# Patient Record
Sex: Female | Born: 1950 | Race: Black or African American | Hispanic: No | Smoking: Never smoker
Health system: Southern US, Community
[De-identification: ages and names within clinical notes are randomized; demographics above are authoritative.]

## PROBLEM LIST (undated history)

## (undated) DIAGNOSIS — K219 Gastro-esophageal reflux disease without esophagitis: Secondary | ICD-10-CM

## (undated) DIAGNOSIS — D649 Anemia, unspecified: Secondary | ICD-10-CM

## (undated) DIAGNOSIS — K449 Diaphragmatic hernia without obstruction or gangrene: Secondary | ICD-10-CM

## (undated) DIAGNOSIS — E7800 Pure hypercholesterolemia, unspecified: Secondary | ICD-10-CM

## (undated) DIAGNOSIS — M1990 Unspecified osteoarthritis, unspecified site: Secondary | ICD-10-CM

## (undated) DIAGNOSIS — F419 Anxiety disorder, unspecified: Secondary | ICD-10-CM

## (undated) DIAGNOSIS — I10 Essential (primary) hypertension: Secondary | ICD-10-CM

## (undated) HISTORY — PX: TUBAL LIGATION: SHX77

## (undated) HISTORY — PX: TOTAL HIP ARTHROPLASTY: SHX124

## (undated) HISTORY — PX: NASAL SINUS SURGERY: SHX719

## (undated) HISTORY — PX: BREAST SURGERY: SHX581

---

## 2002-05-04 ENCOUNTER — Encounter: Admission: RE | Admit: 2002-05-04 | Discharge: 2002-05-04 | Payer: Self-pay | Admitting: Internal Medicine

## 2002-05-04 ENCOUNTER — Encounter: Payer: Self-pay | Admitting: Internal Medicine

## 2003-02-22 ENCOUNTER — Inpatient Hospital Stay (HOSPITAL_COMMUNITY): Admission: RE | Admit: 2003-02-22 | Discharge: 2003-02-28 | Payer: Self-pay | Admitting: Orthopedic Surgery

## 2018-05-17 ENCOUNTER — Inpatient Hospital Stay: Admit: 2018-05-17 | Payer: Self-pay | Admitting: Orthopedic Surgery

## 2018-05-17 SURGERY — ARTHROPLASTY, KNEE, TOTAL
Anesthesia: Choice | Laterality: Left

## 2018-11-15 ENCOUNTER — Ambulatory Visit: Admit: 2018-11-15 | Payer: Self-pay | Admitting: Orthopedic Surgery

## 2018-11-15 SURGERY — ARTHROPLASTY, KNEE, TOTAL
Anesthesia: Choice | Laterality: Left

## 2019-04-01 ENCOUNTER — Encounter (HOSPITAL_BASED_OUTPATIENT_CLINIC_OR_DEPARTMENT_OTHER): Payer: Self-pay | Admitting: *Deleted

## 2019-04-01 ENCOUNTER — Other Ambulatory Visit: Payer: Self-pay

## 2019-04-01 ENCOUNTER — Emergency Department (HOSPITAL_BASED_OUTPATIENT_CLINIC_OR_DEPARTMENT_OTHER)
Admission: EM | Admit: 2019-04-01 | Discharge: 2019-04-01 | Disposition: A | Discharge status: Home / Self Care | Payer: Medicare Other | Attending: Emergency Medicine | Admitting: Emergency Medicine

## 2019-04-01 DIAGNOSIS — I10 Essential (primary) hypertension: Secondary | ICD-10-CM | POA: Insufficient documentation

## 2019-04-01 DIAGNOSIS — Z20828 Contact with and (suspected) exposure to other viral communicable diseases: Secondary | ICD-10-CM | POA: Insufficient documentation

## 2019-04-01 DIAGNOSIS — R112 Nausea with vomiting, unspecified: Secondary | ICD-10-CM | POA: Diagnosis not present

## 2019-04-01 HISTORY — DX: Essential (primary) hypertension: I10

## 2019-04-01 HISTORY — DX: Pure hypercholesterolemia, unspecified: E78.00

## 2019-04-01 MED ORDER — ONDANSETRON 4 MG PO TBDP: 4.0000 mg | ORAL_TABLET | Freq: Three times a day (TID) | ORAL | 0 refills | Status: DC | PRN

## 2019-04-01 MED ORDER — ONDANSETRON 4 MG PO TBDP
4.0000 mg | ORAL_TABLET | Freq: Once | ORAL | Status: AC
  Administered 2019-04-01: 4 mg via ORAL
  Filled 2019-04-01: qty 1

## 2019-04-01 NOTE — Discharge Instructions (Signed)
You were seen in the emergency department today with nausea and vomiting.  I'm prescribing a medication for nausea called Zofran.  Please take as needed.  I'm also testing you for COVID-19.  You can follow the results in the MyChart app.  There is information in this paperwork on how to set that up and follow your results.  Please remain in quarantine until those come back and you're feeling better.  Please return to the emergency department any new or suddenly worsening symptoms.

## 2019-04-01 NOTE — ED Notes (Signed)
Diet Ginger Ale provided for po challenge; pt instructed to wait 15 min after Zofran given to try fluids

## 2019-04-01 NOTE — ED Provider Notes (Signed)
Emergency Department Provider Note   I have reviewed the triage vital signs and the nursing notes.   HISTORY  Chief Complaint Emesis   HPI Vanessa Blair is a 68 y.o. female with PMH of HLD and HTN presents to the emergency department for evaluation of acute onset nausea and vomiting starting this afternoon.  Patient states that she had 3 episodes of vomiting without blood in the emesis.  No diarrhea.  She denies pain in the abdomen or chest.  No shortness of breath or heart palpitations.  Denies diaphoresis.  No fevers or shaking chills.  Patient states that since vomiting the third time she is feeling somewhat better but decided to present to the emergency department to get checked out anyway.  She has no sore throat, nasal congestion, body aches.  No known COVID-19 contacts.  She states she is interested in discussing the COVID-19 test, however.   Past Medical History:  Diagnosis Date  . Hypercholesteremia   . Hypertension     There are no problems to display for this patient.   Past Surgical History:  Procedure Laterality Date  . TOTAL HIP ARTHROPLASTY      Allergies Patient has no known allergies.  History reviewed. No pertinent family history.  Social History Social History   Tobacco Use  . Smoking status: Never Smoker  . Smokeless tobacco: Never Used  Substance Use Topics  . Alcohol use: Never  . Drug use: Never    Review of Systems  Constitutional: No fever/chills Eyes: No visual changes. ENT: No sore throat. Cardiovascular: Denies chest pain. Respiratory: Denies shortness of breath. Gastrointestinal: No abdominal pain. Positive nausea and vomiting.  No diarrhea.  No constipation. Genitourinary: Negative for dysuria. Musculoskeletal: Negative for back pain. Skin: Negative for rash. Neurological: Negative for headaches, focal weakness or numbness.  10-point ROS otherwise negative.  ____________________________________________   PHYSICAL  EXAM:  VITAL SIGNS: ED Triage Vitals  Enc Vitals Group     BP 04/01/19 1553 110/75     Pulse Rate 04/01/19 1553 94     Resp 04/01/19 1553 18     Temp 04/01/19 1553 97.6 F (36.4 C)     Temp Source 04/01/19 1553 Oral     SpO2 04/01/19 1553 90 %     Weight 04/01/19 1548 230 lb (104.3 kg)     Height 04/01/19 1548 5\' 9"  (1.753 m)   Constitutional: Alert and oriented. Well appearing and in no acute distress. Eyes: Conjunctivae are normal.  Head: Atraumatic. Nose: No congestion/rhinnorhea. Mouth/Throat: Mucous membranes are moist.   Neck: No stridor.   Cardiovascular: Normal rate, regular rhythm. Good peripheral circulation. Grossly normal heart sounds.   Respiratory: Normal respiratory effort.  No retractions. Lungs CTAB. Gastrointestinal: Soft and nontender. No distention.  Musculoskeletal: No gross deformities of extremities. Neurologic:  Normal speech and language.  Skin:  Skin is warm, dry and intact. No rash noted.  ____________________________________________   PROCEDURES  Procedure(s) performed:   Procedures  None  ____________________________________________   INITIAL IMPRESSION / ASSESSMENT AND PLAN / ED COURSE  Pertinent labs & imaging results that were available during my care of the patient were reviewed by me and considered in my medical decision making (see chart for details).   Patient presents to the emergency department with 3 separate episodes of vomiting.  No abdominal pain or tenderness on exam.  Patient denies chest pain, diaphoresis, or additional symptoms to make me suspect atypical anginal presentation.  Patient is feeling better after  vomiting the third time.  Plan for ODT Zofran and p.o. challenge here.  I suspect that GI symptoms in isolation or not likely related to COVID-19 but this could represent a possible early infection.  We will send PCR testing as a precaution and instruct patient to quarantine pending results.  She will check results in  MyChart.   Patient tolerating PO without difficulty. Will discharge with Zofran and plan as above.  ____________________________________________  FINAL CLINICAL IMPRESSION(S) / ED DIAGNOSES  Final diagnoses:  Non-intractable vomiting with nausea, unspecified vomiting type     MEDICATIONS GIVEN DURING THIS VISIT:  Medications  ondansetron (ZOFRAN-ODT) disintegrating tablet 4 mg (4 mg Oral Given 04/01/19 1619)     NEW OUTPATIENT MEDICATIONS STARTED DURING THIS VISIT:  Discharge Medication List as of 04/01/2019  5:13 PM    START taking these medications   Details  ondansetron (ZOFRAN ODT) 4 MG disintegrating tablet Take 1 tablet (4 mg total) by mouth every 8 (eight) hours as needed for nausea or vomiting., Starting Fri 04/01/2019, Print        Note:  This document was prepared using Dragon voice recognition software and may include unintentional dictation errors.  Alona Bene, MD, [facility] Emergency [facility]    Arhan Mcmanamon, Arlyss Repress, MD 04/02/19 [phone]

## 2019-04-01 NOTE — ED Triage Notes (Signed)
Vomiting that started a couple hours PTA. Denies pain. Reports taking laxative last night and having BMs this morning.

## 2019-04-03 LAB — NOVEL CORONAVIRUS, NAA (HOSP ORDER, SEND-OUT TO REF LAB; TAT 18-24 HRS): SARS-CoV-2, NAA: NOT DETECTED

## 2019-05-23 ENCOUNTER — Ambulatory Visit: Admit: 2019-05-23 | Payer: Medicare Other | Admitting: Orthopedic Surgery

## 2019-05-23 SURGERY — ARTHROPLASTY, KNEE, TOTAL
Anesthesia: Choice | Site: Knee | Laterality: Left

## 2019-06-29 NOTE — H&P (Signed)
TOTAL KNEE ADMISSION H&P  Patient is being admitted for left total knee arthroplasty.  Subjective:  Chief Complaint: Left knee pain.  HPI: Vanessa Blair, 69 y.o. female has a history of pain and functional disability in the left knee due to arthritis and has failed non-surgical conservative treatments for greater than 12 weeks to include corticosteriod injections, viscosupplementation injections and activity modification. Onset of symptoms was gradual, starting several years ago with gradually worsening course since that time. The patient noted no past surgery on the left knee.  Patient currently rates pain in the left knee at 9 out of 10 with activity. Patient has worsening of pain with activity and weight bearing, pain that interferes with activities of daily living, crepitus and joint swelling. Patient has evidence of severe collapse of the medial joint with bone on bone arthritis and tibial subluxation. large osteophyte formation along the medial femoral condyle by imaging studies. There is no active infection.  There are no problems to display for this patient.   Past Medical History:  Diagnosis Date  . Hypercholesteremia   . Hypertension     Past Surgical History:  Procedure Laterality Date  . TOTAL HIP ARTHROPLASTY      Prior to Admission medications   Medication Sig Start Date End Date Taking? Authorizing Provider  ondansetron (ZOFRAN ODT) 4 MG disintegrating tablet Take 1 tablet (4 mg total) by mouth every 8 (eight) hours as needed for nausea or vomiting. 04/01/19   Long, Arlyss Repress, MD    No Known Allergies  Social History   Socioeconomic History  . Marital status: Widowed    Spouse name: Not on file  . Number of children: Not on file  . Years of education: Not on file  . Highest education level: Not on file  Occupational History  . Not on file  Tobacco Use  . Smoking status: Never Smoker  . Smokeless tobacco: Never Used  Substance and Sexual Activity  . Alcohol  use: Never  . Drug use: Never  . Sexual activity: Not on file  Other Topics Concern  . Not on file  Social History Narrative  . Not on file   Social Determinants of Health   Financial Resource Strain:   . Difficulty of Paying Living Expenses:   Food Insecurity:   . Worried About Programme researcher, broadcasting/film/video in the Last Year:   . Barista in the Last Year:   Transportation Needs:   . Freight forwarder (Medical):   Marland Kitchen Lack of Transportation (Non-Medical):   Physical Activity:   . Days of Exercise per Week:   . Minutes of Exercise per Session:   Stress:   . Feeling of Stress :   Social Connections:   . Frequency of Communication with Friends and Family:   . Frequency of Social Gatherings with Friends and Family:   . Attends Religious Services:   . Active Member of Clubs or Organizations:   . Attends Banker Meetings:   Marland Kitchen Marital Status:   Intimate Partner Violence:   . Fear of Current or Ex-Partner:   . Emotionally Abused:   Marland Kitchen Physically Abused:   . Sexually Abused:       Tobacco Use: Low Risk   . Smoking Tobacco Use: Never Smoker  . Smokeless Tobacco Use: Never Used   Social History   Substance and Sexual Activity  Alcohol Use Never    No family history on file.  Review of Systems  Constitutional:  Negative for chills and fever.  HENT: Negative for congestion, sore throat and tinnitus.   Eyes: Negative for double vision, photophobia and pain.  Respiratory: Negative for cough, shortness of breath and wheezing.   Cardiovascular: Negative for chest pain, palpitations and orthopnea.  Gastrointestinal: Negative for heartburn, nausea and vomiting.  Genitourinary: Negative for dysuria, frequency and urgency.  Musculoskeletal: Positive for joint pain.  Neurological: Negative for dizziness, weakness and headaches.    Objective:  Physical Exam: Well nourished and well developed.  General: Alert and oriented x3, cooperative and pleasant, no acute  distress.  Head: normocephalic, atraumatic, neck supple.  Eyes: EOMI.  Respiratory: breath sounds clear in all fields, no wheezing, rales, or rhonchi. Cardiovascular: Regular rate and rhythm, no murmurs, gallops or rubs.  Abdomen: non-tender to palpation and soft, normoactive bowel sounds. Musculoskeletal:  Left Knee Exam:  Mild effusion.  Mild soft tissue swelling.  Varus deformity. Range of motion is 10-110 degrees.  Marked crepitus on range of motion of the knee.  Positive medial joint line tenderness. No lateral joint line tenderness.  No instability.  Calves soft and nontender. Motor function intact in LE. Strength 5/5 LE bilaterally. Neuro: Distal pulses 2+. Sensation to light touch intact in LE.  Vital signs in last 24 hours: Blood pressure: 132/82 mmHg  Imaging Review Plain radiographs demonstrate severe degenerative joint disease of the left knee. The overall alignment is significant varus. The bone quality appears to be adequate for age and reported activity level.  Assessment/Plan:  End stage arthritis, left knee   The patient history, physical examination, clinical judgment of the provider and imaging studies are consistent with end stage degenerative joint disease of the left knee and total knee arthroplasty is deemed medically necessary. The treatment options including medical management, injection therapy arthroscopy and arthroplasty were discussed at length. The risks and benefits of total knee arthroplasty were presented and reviewed. The risks due to aseptic loosening, infection, stiffness, patella tracking problems, thromboembolic complications and other imponderables were discussed. The patient acknowledged the explanation, agreed to proceed with the plan and consent was signed. Patient is being admitted for inpatient treatment for surgery, pain control, PT, OT, prophylactic antibiotics, VTE prophylaxis, progressive ambulation and ADLs and discharge planning. The  patient is planning to be discharged home.   Patient's anticipated LOS is less than 2 midnights, meeting these requirements: - Younger than 11 - Lives within 1 hour of care - Has a competent adult at home to recover with post-op recover - NO history of  - Chronic pain requiring opiods  - Diabetes  - Coronary Artery Disease  - Heart failure  - Heart attack  - Stroke  - DVT/VTE  - Cardiac arrhythmia  - Respiratory Failure/COPD  - Renal failure  - Anemia  - Advanced Liver disease  Therapy Plans: Outpatient therapy at [facility]) Disposition: Home with friend and sons Planned DVT Prophylaxis: Aspirin 325 mg BID DME Needed: Gilford Rile PCP: Concepcion Elk, MD (appointment on 03/29) TXA: IV Allergies: NKDA Anesthesia Concerns: None BMI: 34.7  - Patient was instructed on what medications to stop prior to surgery. - Follow-up visit in 2 weeks with Dr. Wynelle Link - Begin physical therapy following surgery - Pre-operative lab work as pre-surgical testing - Prescriptions will be provided in hospital at time of discharge  Theresa Duty, PA-C Orthopedic Surgery [facility] [place]

## 2019-07-06 NOTE — Patient Instructions (Addendum)
DUE TO COVID-19 ONLY ONE VISITOR IS ALLOWED TO COME WITH YOU AND STAY IN THE WAITING ROOM ONLY DURING PRE OP AND PROCEDURE DAY OF SURGERY. THE 1 VISITOR MAY VISIT WITH YOU AFTER SURGERY IN YOUR PRIVATE ROOM DURING VISITING HOURS ONLY!  YOU NEED TO HAVE A COVID 19 TEST ON: 07/14/19 @  10:00 am , THIS TEST MUST BE DONE BEFORE SURGERY, COME  [address], [place] , [id number].  [facility]) ONCE YOUR COVID TEST IS COMPLETED, PLEASE BEGIN THE QUARANTINE INSTRUCTIONS AS OUTLINED IN YOUR HANDOUT.                [place]    Your procedure is scheduled on: 07/18/19   Report to [facility] Main  Entrance   Report to admitting at: 8:00 AM     Call this number if you have problems the morning of surgery [phone]    Remember:   BRUSH YOUR TEETH MORNING OF SURGERY AND RINSE YOUR MOUTH OUT, NO CHEWING GUM CANDY OR MINTS.     Take these medicines the morning of surgery with A SIP OF WATER: esomeprazole,metoprolol.Use fluticasone and eye drops as usual.                                You may not have any metal on your body including hair pins and              piercings  Do not wear jewelry, make-up, lotions, powders or perfumes, deodorant             Do not wear nail polish on your fingernails.  Do not shave  48 hours prior to surgery.           Do not bring valuables to the hospital. [facility] IS NOT             RESPONSIBLE   FOR VALUABLES.  Contacts, dentures or bridgework may not be worn into surgery.  Leave suitcase in the car. After surgery it may be brought to your room.     Patients discharged the day of surgery will not be allowed to drive home. IF YOU ARE HAVING SURGERY AND GOING HOME THE SAME DAY, YOU MUST HAVE AN ADULT TO DRIVE YOU HOME AND BE WITH YOU FOR 24 HOURS. YOU MAY GO HOME BY TAXI OR UBER OR ORTHERWISE, BUT AN ADULT MUST ACCOMPANY YOU HOME AND STAY WITH YOU FOR 24 HOURS.  Name and phone number of your driver:  Special Instructions:  N/A              Please read over the following fact sheets you were given: _____________________________________________________________________             NO SOLID FOOD AFTER MIDNIGHT THE NIGHT PRIOR TO SURGERY. NOTHING BY MOUTH EXCEPT CLEAR LIQUIDS UNTIL: 7:30 am. PLEASE FINISH ENSURE DRINK PER SURGEON ORDER  WHICH NEEDS TO BE COMPLETED AT: 7:30 am .   CLEAR LIQUID DIET   Foods Allowed                                                                     Foods Excluded  Coffee and tea, regular and decaf                             liquids that you cannot  Plain Jell-O any favor except red or purple                                           see through such as: Fruit ices (not with fruit pulp)                                     milk, soups, orange juice  Iced Popsicles                                    All solid food Carbonated beverages, regular and diet                                    Cranberry, grape and apple juices Sports drinks like Gatorade Lightly seasoned clear broth or consume(fat free) Sugar, honey syrup  Sample Menu Breakfast                                Lunch                                     Supper Cranberry juice                    Beef broth                            Chicken broth Jell-O                                     Grape juice                           Apple juice Coffee or tea                        Jell-O                                      Popsicle                                                Coffee or tea                        Coffee or tea  _____________________________________________________________________  [facility] - Preparing for Surgery Before surgery, you can play an important role.  Because skin is not sterile, your skin needs to be  as free of germs as possible.  You can reduce the number of germs on your skin by washing with CHG (chlorahexidine gluconate) soap before surgery.  CHG is an antiseptic cleaner which kills germs  and bonds with the skin to continue killing germs even after washing. Please DO NOT use if you have an allergy to CHG or antibacterial soaps.  If your skin becomes reddened/irritated stop using the CHG and inform your nurse when you arrive at Short Stay. Do not shave (including legs and underarms) for at least 48 hours prior to the first CHG shower.  You may shave your face/neck. Please follow these instructions carefully:  1.  Shower with CHG Soap the night before surgery and the  morning of Surgery.  2.  If you choose to wash your hair, wash your hair first as usual with your  normal  shampoo.  3.  After you shampoo, rinse your hair and body thoroughly to remove the  shampoo.                           4.  Use CHG as you would any other liquid soap.  You can apply chg directly  to the skin and wash                       Gently with a scrungie or clean washcloth.  5.  Apply the CHG Soap to your body ONLY FROM THE NECK DOWN.   Do not use on face/ open                           Wound or open sores. Avoid contact with eyes, ears mouth and genitals (private parts).                       Wash face,  Genitals (private parts) with your normal soap.             6.  Wash thoroughly, paying special attention to the area where your surgery  will be performed.  7.  Thoroughly rinse your body with warm water from the neck down.  8.  DO NOT shower/wash with your normal soap after using and rinsing off  the CHG Soap.                9.  Pat yourself dry with a clean towel.            10.  Wear clean pajamas.            11.  Place clean sheets on your bed the night of your first shower and do not  sleep with pets. Day of Surgery : Do not apply any lotions/deodorants the morning of surgery.  Please wear clean clothes to the hospital/surgery [facility].  FAILURE TO FOLLOW THESE INSTRUCTIONS MAY RESULT IN THE CANCELLATION OF YOUR SURGERY PATIENT SIGNATURE_________________________________  NURSE  SIGNATURE__________________________________  ________________________________________________________________________   [name]  An incentive spirometer is a tool that can help keep your lungs clear and active. This tool measures how well you are filling your lungs with each breath. Taking long deep breaths may help reverse or decrease the chance of developing breathing (pulmonary) problems (especially infection) following:  A long period of time when you are unable to move or be active. BEFORE THE PROCEDURE   If the spirometer includes an indicator to show your best  effort, your nurse or respiratory therapist will set it to a desired goal.  If possible, sit up straight or lean slightly forward. Try not to slouch.  Hold the incentive spirometer in an upright position. INSTRUCTIONS FOR USE  1. Sit on the edge of your bed if possible, or sit up as far as you can in bed or on a chair. 2. Hold the incentive spirometer in an upright position. 3. Breathe out normally. 4. Place the mouthpiece in your mouth and seal your lips tightly around it. 5. Breathe in slowly and as deeply as possible, raising the piston or the ball toward the top of the column. 6. Hold your breath for 3-5 seconds or for as long as possible. Allow the piston or ball to fall to the bottom of the column. 7. Remove the mouthpiece from your mouth and breathe out normally. 8. Rest for a few seconds and repeat Steps 1 through 7 at least 10 times every 1-2 hours when you are awake. Take your time and take a few normal breaths between deep breaths. 9. The spirometer may include an indicator to show your best effort. Use the indicator as a goal to work toward during each repetition. 10. After each set of 10 deep breaths, practice coughing to be sure your lungs are clear. If you have an incision (the cut made at the time of surgery), support your incision when coughing by placing a pillow or rolled up towels firmly  against it. Once you are able to get out of bed, walk around indoors and cough well. You may stop using the incentive spirometer when instructed by your caregiver.  RISKS AND COMPLICATIONS  Take your time so you do not get dizzy or light-headed.  If you are in pain, you may need to take or ask for pain medication before doing incentive spirometry. It is harder to take a deep breath if you are having pain. AFTER USE  Rest and breathe slowly and easily.  It can be helpful to keep track of a log of your progress. Your caregiver can provide you with a simple table to help with this. If you are using the spirometer at home, follow these instructions: [place] IF:   You are having difficultly using the spirometer.  You have trouble using the spirometer as often as instructed.  Your pain medication is not giving enough relief while using the spirometer.  You develop fever of 100.5 F (38.1 C) or higher. SEEK IMMEDIATE MEDICAL CARE IF:   You cough up bloody sputum that had not been present before.  You develop fever of 102 F (38.9 C) or greater.  You develop worsening pain at or near the incision site. MAKE SURE YOU:   Understand these instructions.  Will watch your condition.  Will get help right away if you are not doing well or get worse. Document Released: 08/04/2006 Document Revised: 06/16/2011 Document Reviewed: 10/05/2006 Exit[employer] Patient Information 2014 Exit[employer], [place].   ________________________________________________________________________  WHAT IS A BLOOD TRANSFUSION? Blood Transfusion Information  A transfusion is the replacement of blood or some of its parts. Blood is made up of multiple cells which provide different functions.  Red blood cells carry oxygen and are used for blood loss replacement.  White blood cells fight against infection.  Platelets control bleeding.  Plasma helps clot blood.  Other blood products are available for  specialized needs, such as hemophilia or other clotting disorders. BEFORE THE TRANSFUSION  Who gives blood for transfusions?  Healthy volunteers who are fully evaluated to make sure their blood is safe. This is blood bank blood. Transfusion therapy is the safest it has ever been in the practice of medicine. Before blood is taken from a donor, a complete history is taken to make sure that person has no history of diseases nor engages in risky social behavior (examples are intravenous drug use or sexual activity with multiple partners). The donor's travel history is screened to minimize risk of transmitting infections, such as malaria. The donated blood is tested for signs of infectious diseases, such as HIV and hepatitis. The blood is then tested to be sure it is compatible with you in order to minimize the chance of a transfusion reaction. If you or a relative donates blood, this is often done in anticipation of surgery and is not appropriate for emergency situations. It takes many days to process the donated blood. RISKS AND COMPLICATIONS Although transfusion therapy is very safe and saves many lives, the main dangers of transfusion include:   Getting an infectious disease.  Developing a transfusion reaction. This is an allergic reaction to something in the blood you were given. Every precaution is taken to prevent this. The decision to have a blood transfusion has been considered carefully by your caregiver before blood is given. Blood is not given unless the benefits outweigh the risks. AFTER THE TRANSFUSION  Right after receiving a blood transfusion, you will usually feel much better and more energetic. This is especially true if your red blood cells have gotten low (anemic). The transfusion raises the level of the red blood cells which carry oxygen, and this usually causes an energy increase.  The nurse administering the transfusion will monitor you carefully for complications. HOME CARE  INSTRUCTIONS  No special instructions are needed after a transfusion. You may find your energy is better. Speak with your caregiver about any limitations on activity for underlying diseases you may have. SEEK MEDICAL CARE IF:   Your condition is not improving after your transfusion.  You develop redness or irritation at the intravenous (IV) site. SEEK IMMEDIATE MEDICAL CARE IF:  Any of the following symptoms occur over the next 12 hours:  Shaking chills.  You have a temperature by mouth above 102 F (38.9 C), not controlled by medicine.  Chest, back, or muscle pain.  People around you feel you are not acting correctly or are confused.  Shortness of breath or difficulty breathing.  Dizziness and fainting.  You get a rash or develop hives.  You have a decrease in urine output.  Your urine turns a dark color or changes to pink, red, or brown. Any of the following symptoms occur over the next 10 days:  You have a temperature by mouth above 102 F (38.9 C), not controlled by medicine.  Shortness of breath.  Weakness after normal activity.  The white part of the eye turns yellow (jaundice).  You have a decrease in the amount of urine or are urinating less often.  Your urine turns a dark color or changes to pink, red, or brown. Document Released: 03/21/2000 Document Revised: 06/16/2011 Document Reviewed: 11/08/2007 [facility] Patient Information 2014 [place], [place].  _______________________________________________________________________

## 2019-07-07 ENCOUNTER — Encounter (HOSPITAL_COMMUNITY)
Admission: RE | Admit: 2019-07-07 | Discharge: 2019-07-07 | Disposition: A | Discharge status: Home / Self Care | Payer: Medicare Other | Source: Ambulatory Visit | Attending: Orthopedic Surgery | Admitting: Orthopedic Surgery

## 2019-07-07 ENCOUNTER — Encounter (HOSPITAL_COMMUNITY): Payer: Self-pay

## 2019-07-07 ENCOUNTER — Other Ambulatory Visit: Payer: Self-pay

## 2019-07-07 DIAGNOSIS — Z01812 Encounter for preprocedural laboratory examination: Secondary | ICD-10-CM | POA: Insufficient documentation

## 2019-07-07 HISTORY — DX: Unspecified osteoarthritis, unspecified site: M19.90

## 2019-07-07 HISTORY — DX: Anemia, unspecified: D64.9

## 2019-07-07 LAB — PROTIME-INR
INR: 1 (ref 0.8–1.2)
Prothrombin Time: 13.4 seconds (ref 11.4–15.2)

## 2019-07-07 LAB — CBC
HCT: 38.4 % (ref 36.0–46.0)
Hemoglobin: 12.1 g/dL (ref 12.0–15.0)
MCH: 29.9 pg (ref 26.0–34.0)
MCHC: 31.5 g/dL (ref 30.0–36.0)
MCV: 94.8 fL (ref 80.0–100.0)
Platelets: 277 10*3/uL (ref 150–400)
RBC: 4.05 MIL/uL (ref 3.87–5.11)
RDW: 13.1 % (ref 11.5–15.5)
WBC: 5 10*3/uL (ref 4.0–10.5)
nRBC: 0 % (ref 0.0–0.2)

## 2019-07-07 LAB — COMPREHENSIVE METABOLIC PANEL
ALT: 13 U/L (ref 0–44)
AST: 21 U/L (ref 15–41)
Albumin: 4 g/dL (ref 3.5–5.0)
Alkaline Phosphatase: 72 U/L (ref 38–126)
Anion gap: 10 (ref 5–15)
BUN: 19 mg/dL (ref 8–23)
CO2: 27 mmol/L (ref 22–32)
Calcium: 9.7 mg/dL (ref 8.9–10.3)
Chloride: 108 mmol/L (ref 98–111)
Creatinine, Ser: 0.91 mg/dL (ref 0.44–1.00)
GFR calc Af Amer: >60 mL/min (ref >60)
GFR calc non Af Amer: >60 mL/min (ref >60)
Glucose, Bld: 142 mg/dL — ABNORMAL HIGH (ref 70–99)
Potassium: 4.2 mmol/L (ref 3.5–5.1)
Sodium: 145 mmol/L (ref 135–145)
Total Bilirubin: 0.7 mg/dL (ref 0.3–1.2)
Total Protein: 7.9 g/dL (ref 6.5–8.1)

## 2019-07-07 LAB — TYPE AND SCREEN
ABO/RH(D): O POS
Antibody Screen: NEGATIVE

## 2019-07-07 LAB — ABO/RH: ABO/RH(D): O POS

## 2019-07-07 LAB — APTT: aPTT: 33 seconds (ref 24–36)

## 2019-07-07 LAB — SURGICAL PCR SCREEN
MRSA, PCR: NEGATIVE
Staphylococcus aureus: NEGATIVE

## 2019-07-07 NOTE — Progress Notes (Signed)
PCP - Dr. Feliberto Harts. LOV: 07/04/19 [facility] Cardiologist -   Chest x-ray -  EKG - 07/04/19 [facility] Stress Test -  ECHO - 08/03/18 [facility] Cardiac Cath -   Sleep Study -  CPAP -   Fasting Blood Sugar -  Checks Blood Sugar _____ times a day  Blood Thinner Instructions: Aspirin Instructions: Last Dose: 07/12/19  Anesthesia review:   Patient denies shortness of breath, fever, cough and chest pain at PAT appointment   Patient verbalized understanding of instructions that were given to them at the PAT appointment. Patient was also instructed that they will need to review over the PAT instructions again at home before surgery.

## 2019-07-11 NOTE — Progress Notes (Signed)
Requested EKG and clearance from office of DR Ardyth Gal.

## 2019-07-11 NOTE — Progress Notes (Signed)
Received and placed on chart EKG received from PCp office .  EKG done 07/04/19.

## 2019-07-14 ENCOUNTER — Other Ambulatory Visit (HOSPITAL_COMMUNITY)
Admission: RE | Admit: 2019-07-14 | Discharge: 2019-07-14 | Disposition: A | Discharge status: Home / Self Care | Payer: Medicare Other | Source: Ambulatory Visit | Attending: Orthopedic Surgery | Admitting: Orthopedic Surgery

## 2019-07-14 DIAGNOSIS — Z20822 Contact with and (suspected) exposure to covid-19: Secondary | ICD-10-CM | POA: Insufficient documentation

## 2019-07-14 DIAGNOSIS — Z01812 Encounter for preprocedural laboratory examination: Secondary | ICD-10-CM | POA: Diagnosis present

## 2019-07-14 LAB — SARS CORONAVIRUS 2 (TAT 6-24 HRS): SARS Coronavirus 2: NEGATIVE

## 2019-07-17 MED ORDER — BUPIVACAINE LIPOSOME 1.3 % IJ SUSP
20.0000 mL | INTRAMUSCULAR | Status: DC
  Filled 2019-07-17: qty 20

## 2019-07-18 ENCOUNTER — Ambulatory Visit (HOSPITAL_COMMUNITY): Payer: Medicare Other | Admitting: Physician Assistant

## 2019-07-18 ENCOUNTER — Encounter (HOSPITAL_COMMUNITY): Payer: Self-pay | Admitting: Orthopedic Surgery

## 2019-07-18 ENCOUNTER — Observation Stay (HOSPITAL_COMMUNITY)
Admission: RE | Admit: 2019-07-18 | Discharge: 2019-07-20 | Disposition: A | Discharge status: Home / Self Care | Payer: Medicare Other | Source: Ambulatory Visit | Attending: Orthopedic Surgery | Admitting: Orthopedic Surgery

## 2019-07-18 ENCOUNTER — Encounter (HOSPITAL_COMMUNITY)
Admission: RE | Disposition: A | Discharge status: Home / Self Care | Payer: Self-pay | Source: Ambulatory Visit | Attending: Orthopedic Surgery

## 2019-07-18 ENCOUNTER — Other Ambulatory Visit: Payer: Self-pay

## 2019-07-18 ENCOUNTER — Ambulatory Visit (HOSPITAL_COMMUNITY): Payer: Medicare Other | Admitting: Certified Registered"

## 2019-07-18 DIAGNOSIS — M1710 Unilateral primary osteoarthritis, unspecified knee: Secondary | ICD-10-CM | POA: Diagnosis present

## 2019-07-18 DIAGNOSIS — M25762 Osteophyte, left knee: Secondary | ICD-10-CM | POA: Insufficient documentation

## 2019-07-18 DIAGNOSIS — Z885 Allergy status to narcotic agent status: Secondary | ICD-10-CM | POA: Diagnosis not present

## 2019-07-18 DIAGNOSIS — E7800 Pure hypercholesterolemia, unspecified: Secondary | ICD-10-CM | POA: Insufficient documentation

## 2019-07-18 DIAGNOSIS — I10 Essential (primary) hypertension: Secondary | ICD-10-CM | POA: Insufficient documentation

## 2019-07-18 DIAGNOSIS — Z791 Long term (current) use of non-steroidal anti-inflammatories (NSAID): Secondary | ICD-10-CM | POA: Insufficient documentation

## 2019-07-18 DIAGNOSIS — M1712 Unilateral primary osteoarthritis, left knee: Secondary | ICD-10-CM | POA: Diagnosis present

## 2019-07-18 DIAGNOSIS — M179 Osteoarthritis of knee, unspecified: Secondary | ICD-10-CM | POA: Diagnosis present

## 2019-07-18 HISTORY — PX: TOTAL KNEE ARTHROPLASTY: SHX125

## 2019-07-18 SURGERY — ARTHROPLASTY, KNEE, TOTAL
Anesthesia: Spinal | Site: Knee | Laterality: Left

## 2019-07-18 MED ORDER — HYDROCHLOROTHIAZIDE 25 MG PO TABS
25.0000 mg | ORAL_TABLET | Freq: Every day | ORAL | Status: DC
  Administered 2019-07-19 – 2019-07-20 (×2): 25 mg via ORAL
  Filled 2019-07-18 (×2): qty 1

## 2019-07-18 MED ORDER — BUPIVACAINE LIPOSOME 1.3 % IJ SUSP
INTRAMUSCULAR | Status: DC | PRN
  Administered 2019-07-18: 20 mL

## 2019-07-18 MED ORDER — PANTOPRAZOLE SODIUM 40 MG PO TBEC
80.0000 mg | DELAYED_RELEASE_TABLET | Freq: Every day | ORAL | Status: DC
  Administered 2019-07-19 – 2019-07-20 (×2): 80 mg via ORAL
  Filled 2019-07-18 (×2): qty 2

## 2019-07-18 MED ORDER — FENTANYL CITRATE (PF) 250 MCG/5ML IJ SOLN
INTRAMUSCULAR | Status: AC
  Filled 2019-07-18: qty 5

## 2019-07-18 MED ORDER — DEXAMETHASONE SODIUM PHOSPHATE 10 MG/ML IJ SOLN
INTRAMUSCULAR | Status: AC
  Filled 2019-07-18: qty 2

## 2019-07-18 MED ORDER — MEPIVACAINE HCL (PF) 2 % IJ SOLN
INTRAMUSCULAR | Status: AC
  Filled 2019-07-18: qty 20

## 2019-07-18 MED ORDER — POVIDONE-IODINE 10 % EX SWAB
2.0000 "application " | Freq: Once | CUTANEOUS | Status: AC
  Administered 2019-07-18: 2 via TOPICAL

## 2019-07-18 MED ORDER — CEFAZOLIN SODIUM-DEXTROSE 2-4 GM/100ML-% IV SOLN
2.0000 g | Freq: Four times a day (QID) | INTRAVENOUS | Status: AC
  Administered 2019-07-18 (×2): 2 g via INTRAVENOUS
  Filled 2019-07-18 (×2): qty 100

## 2019-07-18 MED ORDER — CLONIDINE HCL (ANALGESIA) 100 MCG/ML EP SOLN
EPIDURAL | Status: DC | PRN
  Administered 2019-07-18: 100 ug

## 2019-07-18 MED ORDER — SIMVASTATIN 20 MG PO TABS
20.0000 mg | ORAL_TABLET | Freq: Every day | ORAL | Status: DC
  Administered 2019-07-18 – 2019-07-19 (×2): 20 mg via ORAL
  Filled 2019-07-18 (×2): qty 1

## 2019-07-18 MED ORDER — ROPIVACAINE HCL 7.5 MG/ML IJ SOLN
INTRAMUSCULAR | Status: DC | PRN
  Administered 2019-07-18: 20 mL via PERINEURAL

## 2019-07-18 MED ORDER — PROPOFOL 500 MG/50ML IV EMUL
INTRAVENOUS | Status: AC
  Filled 2019-07-18: qty 50

## 2019-07-18 MED ORDER — MIDAZOLAM HCL 2 MG/2ML IJ SOLN
INTRAMUSCULAR | Status: DC | PRN
  Administered 2019-07-18: 1 mg via INTRAVENOUS

## 2019-07-18 MED ORDER — ONDANSETRON HCL 4 MG/2ML IJ SOLN: 4.0000 mg | Freq: Four times a day (QID) | INTRAMUSCULAR | Status: DC | PRN

## 2019-07-18 MED ORDER — BISACODYL 10 MG RE SUPP: 10.0000 mg | Freq: Every day | RECTAL | Status: DC | PRN

## 2019-07-18 MED ORDER — METOCLOPRAMIDE HCL 5 MG PO TABS: 5.0000 mg | ORAL_TABLET | Freq: Three times a day (TID) | ORAL | Status: DC | PRN

## 2019-07-18 MED ORDER — ASPIRIN EC 325 MG PO TBEC
325.0000 mg | DELAYED_RELEASE_TABLET | Freq: Two times a day (BID) | ORAL | Status: DC
  Administered 2019-07-19 – 2019-07-20 (×3): 325 mg via ORAL
  Filled 2019-07-18 (×3): qty 1

## 2019-07-18 MED ORDER — MORPHINE SULFATE (PF) 2 MG/ML IV SOLN: 0.5000 mg | INTRAVENOUS | Status: DC | PRN

## 2019-07-18 MED ORDER — ONDANSETRON HCL 4 MG/2ML IJ SOLN
INTRAMUSCULAR | Status: AC
  Filled 2019-07-18: qty 4

## 2019-07-18 MED ORDER — TRANEXAMIC ACID-NACL 1000-0.7 MG/100ML-% IV SOLN
1000.0000 mg | INTRAVENOUS | Status: AC
  Administered 2019-07-18: 1000 mg via INTRAVENOUS
  Filled 2019-07-18: qty 100

## 2019-07-18 MED ORDER — LACTATED RINGERS IV SOLN: INTRAVENOUS | Status: DC

## 2019-07-18 MED ORDER — ALPRAZOLAM 0.25 MG PO TABS: 0.2500 mg | ORAL_TABLET | Freq: Every evening | ORAL | Status: DC | PRN

## 2019-07-18 MED ORDER — BUPIVACAINE IN DEXTROSE 0.75-8.25 % IT SOLN
INTRATHECAL | Status: DC | PRN
  Administered 2019-07-18: 1.6 mL via INTRATHECAL

## 2019-07-18 MED ORDER — ONDANSETRON HCL 4 MG/2ML IJ SOLN: 4.0000 mg | Freq: Once | INTRAMUSCULAR | Status: DC | PRN

## 2019-07-18 MED ORDER — MIDAZOLAM HCL 2 MG/2ML IJ SOLN
INTRAMUSCULAR | Status: AC
  Filled 2019-07-18: qty 2

## 2019-07-18 MED ORDER — DEXAMETHASONE SODIUM PHOSPHATE 10 MG/ML IJ SOLN
10.0000 mg | Freq: Once | INTRAMUSCULAR | Status: AC
  Administered 2019-07-19: 10:00:00 10 mg via INTRAVENOUS
  Filled 2019-07-18: qty 1

## 2019-07-18 MED ORDER — SODIUM CHLORIDE 0.9% FLUSH: 10.0000 mL | Freq: Two times a day (BID) | INTRAVENOUS | Status: DC

## 2019-07-18 MED ORDER — LIDOCAINE 2% (20 MG/ML) 5 ML SYRINGE
INTRAMUSCULAR | Status: AC
  Filled 2019-07-18: qty 10

## 2019-07-18 MED ORDER — PROPOFOL 10 MG/ML IV BOLUS
INTRAVENOUS | Status: DC | PRN
  Administered 2019-07-18 (×2): 20 mg via INTRAVENOUS

## 2019-07-18 MED ORDER — DIPHENHYDRAMINE HCL 12.5 MG/5ML PO ELIX: 12.5000 mg | ORAL_SOLUTION | ORAL | Status: DC | PRN

## 2019-07-18 MED ORDER — GABAPENTIN 300 MG PO CAPS
300.0000 mg | ORAL_CAPSULE | Freq: Three times a day (TID) | ORAL | Status: DC
  Administered 2019-07-18 – 2019-07-20 (×6): 300 mg via ORAL
  Filled 2019-07-18 (×6): qty 1

## 2019-07-18 MED ORDER — PHENYLEPHRINE 40 MCG/ML (10ML) SYRINGE FOR IV PUSH (FOR BLOOD PRESSURE SUPPORT)
PREFILLED_SYRINGE | INTRAVENOUS | Status: AC
  Filled 2019-07-18: qty 10

## 2019-07-18 MED ORDER — OXYCODONE HCL 5 MG PO TABS
5.0000 mg | ORAL_TABLET | ORAL | Status: DC | PRN
  Administered 2019-07-18: 17:00:00 10 mg via ORAL
  Administered 2019-07-18: 14:00:00 5 mg via ORAL
  Administered 2019-07-18 – 2019-07-20 (×7): 10 mg via ORAL
  Filled 2019-07-18 (×4): qty 2
  Filled 2019-07-18: qty 1
  Filled 2019-07-18 (×4): qty 2

## 2019-07-18 MED ORDER — GLYCOPYRROLATE 0.2 MG/ML IJ SOLN
INTRAMUSCULAR | Status: DC | PRN
  Administered 2019-07-18: .2 mg via INTRAVENOUS

## 2019-07-18 MED ORDER — SUCCINYLCHOLINE CHLORIDE 200 MG/10ML IV SOSY
PREFILLED_SYRINGE | INTRAVENOUS | Status: AC
  Filled 2019-07-18: qty 10

## 2019-07-18 MED ORDER — POLYETHYLENE GLYCOL 3350 17 G PO PACK: 17.0000 g | PACK | Freq: Every day | ORAL | Status: DC | PRN

## 2019-07-18 MED ORDER — BUPIVACAINE HCL (PF) 0.5 % IJ SOLN
INTRAMUSCULAR | Status: AC
  Filled 2019-07-18: qty 30

## 2019-07-18 MED ORDER — METHOCARBAMOL 500 MG IVPB - SIMPLE MED
500.0000 mg | Freq: Four times a day (QID) | INTRAVENOUS | Status: DC | PRN
  Filled 2019-07-18: qty 50

## 2019-07-18 MED ORDER — PHENYLEPHRINE HCL (PRESSORS) 10 MG/ML IV SOLN
INTRAVENOUS | Status: AC
  Filled 2019-07-18: qty 2

## 2019-07-18 MED ORDER — FENTANYL CITRATE (PF) 100 MCG/2ML IJ SOLN: 25.0000 ug | INTRAMUSCULAR | Status: DC | PRN

## 2019-07-18 MED ORDER — TRAMADOL HCL 50 MG PO TABS
50.0000 mg | ORAL_TABLET | Freq: Four times a day (QID) | ORAL | Status: DC | PRN
  Administered 2019-07-18 – 2019-07-19 (×2): 50 mg via ORAL
  Filled 2019-07-18 (×2): qty 1

## 2019-07-18 MED ORDER — DOCUSATE SODIUM 100 MG PO CAPS
100.0000 mg | ORAL_CAPSULE | Freq: Two times a day (BID) | ORAL | Status: DC
  Administered 2019-07-18 – 2019-07-20 (×4): 100 mg via ORAL
  Filled 2019-07-18 (×4): qty 1

## 2019-07-18 MED ORDER — FLUTICASONE PROPIONATE 50 MCG/ACT NA SUSP
1.0000 | Freq: Every day | NASAL | Status: DC | PRN
  Filled 2019-07-18: qty 16

## 2019-07-18 MED ORDER — EPHEDRINE 5 MG/ML INJ
INTRAVENOUS | Status: AC
  Filled 2019-07-18: qty 20

## 2019-07-18 MED ORDER — SODIUM CHLORIDE 0.9 % IR SOLN
Status: DC | PRN
  Administered 2019-07-18: 1000 mL

## 2019-07-18 MED ORDER — STERILE WATER FOR IRRIGATION IR SOLN
Status: DC | PRN
  Administered 2019-07-18 (×2): 1000 mL

## 2019-07-18 MED ORDER — ROCURONIUM BROMIDE 10 MG/ML (PF) SYRINGE
PREFILLED_SYRINGE | INTRAVENOUS | Status: AC
  Filled 2019-07-18: qty 10

## 2019-07-18 MED ORDER — FENTANYL CITRATE (PF) 100 MCG/2ML IJ SOLN
50.0000 ug | INTRAMUSCULAR | Status: DC
  Administered 2019-07-18: 09:00:00 50 ug via INTRAVENOUS
  Filled 2019-07-18: qty 2

## 2019-07-18 MED ORDER — PROPOFOL 1000 MG/100ML IV EMUL
INTRAVENOUS | Status: AC
  Filled 2019-07-18: qty 200

## 2019-07-18 MED ORDER — SODIUM CHLORIDE 0.9 % IV SOLN: INTRAVENOUS | Status: DC

## 2019-07-18 MED ORDER — DEXAMETHASONE SODIUM PHOSPHATE 10 MG/ML IJ SOLN: 8.0000 mg | Freq: Once | INTRAMUSCULAR | Status: DC

## 2019-07-18 MED ORDER — PROPOFOL 10 MG/ML IV BOLUS
INTRAVENOUS | Status: AC
  Filled 2019-07-18: qty 20

## 2019-07-18 MED ORDER — LIDOCAINE 2% (20 MG/ML) 5 ML SYRINGE
INTRAMUSCULAR | Status: DC | PRN
  Administered 2019-07-18: 40 mg via INTRAVENOUS

## 2019-07-18 MED ORDER — ONDANSETRON HCL 4 MG/2ML IJ SOLN
INTRAMUSCULAR | Status: DC | PRN
  Administered 2019-07-18: 4 mg via INTRAVENOUS

## 2019-07-18 MED ORDER — ARTIFICIAL TEARS OPHTHALMIC OINT
TOPICAL_OINTMENT | OPHTHALMIC | Status: AC
  Filled 2019-07-18: qty 3.5

## 2019-07-18 MED ORDER — SODIUM CHLORIDE (PF) 0.9 % IJ SOLN
INTRAMUSCULAR | Status: DC | PRN
  Administered 2019-07-18: 60 mL via INTRAVENOUS

## 2019-07-18 MED ORDER — FLEET ENEMA 7-19 GM/118ML RE ENEM: 1.0000 | ENEMA | Freq: Once | RECTAL | Status: DC | PRN

## 2019-07-18 MED ORDER — CHLORHEXIDINE GLUCONATE 4 % EX LIQD: 60.0000 mL | Freq: Once | CUTANEOUS | Status: DC

## 2019-07-18 MED ORDER — SODIUM CHLORIDE 0.9% FLUSH: 10.0000 mL | INTRAVENOUS | Status: DC | PRN

## 2019-07-18 MED ORDER — PROPOFOL 500 MG/50ML IV EMUL
INTRAVENOUS | Status: DC | PRN
  Administered 2019-07-18: 75 ug/kg/min via INTRAVENOUS

## 2019-07-18 MED ORDER — METOPROLOL TARTRATE 50 MG PO TABS
50.0000 mg | ORAL_TABLET | Freq: Two times a day (BID) | ORAL | Status: DC
  Administered 2019-07-19 – 2019-07-20 (×3): 50 mg via ORAL
  Filled 2019-07-18 (×4): qty 1

## 2019-07-18 MED ORDER — METOCLOPRAMIDE HCL 5 MG/ML IJ SOLN: 5.0000 mg | Freq: Three times a day (TID) | INTRAMUSCULAR | Status: DC | PRN

## 2019-07-18 MED ORDER — ONDANSETRON HCL 4 MG PO TABS: 4.0000 mg | ORAL_TABLET | Freq: Four times a day (QID) | ORAL | Status: DC | PRN

## 2019-07-18 MED ORDER — PHENYLEPHRINE HCL-NACL 10-0.9 MG/250ML-% IV SOLN
INTRAVENOUS | Status: DC | PRN
  Administered 2019-07-18: 45 ug/min via INTRAVENOUS

## 2019-07-18 MED ORDER — MENTHOL 3 MG MT LOZG: 1.0000 | LOZENGE | OROMUCOSAL | Status: DC | PRN

## 2019-07-18 MED ORDER — CEFAZOLIN SODIUM-DEXTROSE 2-4 GM/100ML-% IV SOLN
2.0000 g | INTRAVENOUS | Status: AC
  Administered 2019-07-18: 2 mg via INTRAVENOUS
  Filled 2019-07-18: qty 100

## 2019-07-18 MED ORDER — DEXAMETHASONE SODIUM PHOSPHATE 10 MG/ML IJ SOLN
INTRAMUSCULAR | Status: DC | PRN
  Administered 2019-07-18: 10 mg via INTRAVENOUS

## 2019-07-18 MED ORDER — FENTANYL CITRATE (PF) 250 MCG/5ML IJ SOLN
INTRAMUSCULAR | Status: DC | PRN
  Administered 2019-07-18: 50 ug via INTRAVENOUS

## 2019-07-18 MED ORDER — SODIUM CHLORIDE (PF) 0.9 % IJ SOLN
INTRAMUSCULAR | Status: AC
  Filled 2019-07-18: qty 50

## 2019-07-18 MED ORDER — METHOCARBAMOL 500 MG PO TABS
500.0000 mg | ORAL_TABLET | Freq: Four times a day (QID) | ORAL | Status: DC | PRN
  Administered 2019-07-18 – 2019-07-20 (×5): 500 mg via ORAL
  Filled 2019-07-18 (×5): qty 1

## 2019-07-18 MED ORDER — ACETAMINOPHEN 500 MG PO TABS
1000.0000 mg | ORAL_TABLET | Freq: Four times a day (QID) | ORAL | Status: AC
  Administered 2019-07-18 – 2019-07-19 (×4): 1000 mg via ORAL
  Filled 2019-07-18 (×4): qty 2

## 2019-07-18 MED ORDER — PHENOL 1.4 % MT LIQD: 1.0000 | OROMUCOSAL | Status: DC | PRN

## 2019-07-18 MED ORDER — ACETAMINOPHEN 10 MG/ML IV SOLN
1000.0000 mg | Freq: Once | INTRAVENOUS | Status: AC
  Administered 2019-07-18: 1000 mg via INTRAVENOUS
  Filled 2019-07-18: qty 100

## 2019-07-18 MED ORDER — MIDAZOLAM HCL 2 MG/2ML IJ SOLN
1.0000 mg | INTRAMUSCULAR | Status: DC
  Administered 2019-07-18: 09:00:00 1 mg via INTRAVENOUS
  Filled 2019-07-18: qty 2

## 2019-07-18 MED ORDER — SODIUM CHLORIDE (PF) 0.9 % IJ SOLN
INTRAMUSCULAR | Status: AC
  Filled 2019-07-18: qty 10

## 2019-07-18 SURGICAL SUPPLY — 59 items
ATTUNE MED DOME PAT 38 KNEE (Knees) ×1 IMPLANT
ATTUNE MED DOME PAT 38MM KNEE (Knees) ×1 IMPLANT
ATTUNE PS FEM LT SZ 5 CEM KNEE (Femur) ×2 IMPLANT
ATTUNE PSRP INSR SZ 5 10M KNEE (Insert) ×2 IMPLANT
BAG ZIPLOCK 12X15 (MISCELLANEOUS) ×3 IMPLANT
BASE TIBIAL ROT PLAT SZ 5 KNEE (Knees) IMPLANT
BLADE SAG 18X100X1.27 (BLADE) ×3 IMPLANT
BLADE SAW SGTL 11.0X1.19X90.0M (BLADE) ×3 IMPLANT
BLADE SURG SZ10 CARB STEEL (BLADE) ×6 IMPLANT
BNDG ELASTIC 6X5.8 VLCR STR LF (GAUZE/BANDAGES/DRESSINGS) ×3 IMPLANT
BOWL SMART MIX CTS (DISPOSABLE) ×3 IMPLANT
CEMENT HV SMART SET (Cement) ×6 IMPLANT
CLOSURE STERI-STRIP 1/2X4 (GAUZE/BANDAGES/DRESSINGS) ×1
CLOSURE WOUND 1/2 X4 (GAUZE/BANDAGES/DRESSINGS) ×2
CLSR STERI-STRIP ANTIMIC 1/2X4 (GAUZE/BANDAGES/DRESSINGS) ×1 IMPLANT
COVER SURGICAL LIGHT HANDLE (MISCELLANEOUS) ×3 IMPLANT
COVER WAND RF STERILE (DRAPES) IMPLANT
CUFF TOURN SGL QUICK 34 (TOURNIQUET CUFF) ×2
CUFF TRNQT CYL 34X4.125X (TOURNIQUET CUFF) ×1 IMPLANT
DECANTER SPIKE VIAL GLASS SM (MISCELLANEOUS) ×3 IMPLANT
DRAPE U-SHAPE 47X51 STRL (DRAPES) ×3 IMPLANT
DRSG AQUACEL AG ADV 3.5X10 (GAUZE/BANDAGES/DRESSINGS) ×3 IMPLANT
DURAPREP 26ML APPLICATOR (WOUND CARE) ×3 IMPLANT
ELECT REM PT RETURN 15FT ADLT (MISCELLANEOUS) ×3 IMPLANT
EVACUATOR 1/8 PVC DRAIN (DRAIN) IMPLANT
GAUZE SPONGE 2X2 8PLY STRL LF (GAUZE/BANDAGES/DRESSINGS) ×1 IMPLANT
GLOVE BIO SURGEON STRL SZ7 (GLOVE) ×3 IMPLANT
GLOVE BIO SURGEON STRL SZ8 (GLOVE) ×3 IMPLANT
GLOVE BIOGEL PI IND STRL 7.0 (GLOVE) ×1 IMPLANT
GLOVE BIOGEL PI IND STRL 8 (GLOVE) ×1 IMPLANT
GLOVE BIOGEL PI INDICATOR 7.0 (GLOVE) ×2
GLOVE BIOGEL PI INDICATOR 8 (GLOVE) ×2
GOWN STRL REUS W/TWL LRG LVL3 (GOWN DISPOSABLE) ×6 IMPLANT
HANDPIECE INTERPULSE COAX TIP (DISPOSABLE) ×3
HOLDER FOLEY CATH W/STRAP (MISCELLANEOUS) ×2 IMPLANT
IMMOBILIZER KNEE 20 (SOFTGOODS) ×3
IMMOBILIZER KNEE 20 THIGH 36 (SOFTGOODS) ×1 IMPLANT
KIT TURNOVER KIT A (KITS) IMPLANT
MANIFOLD NEPTUNE II (INSTRUMENTS) ×3 IMPLANT
NS IRRIG 1000ML POUR BTL (IV SOLUTION) ×3 IMPLANT
PACK TOTAL KNEE CUSTOM (KITS) ×3 IMPLANT
PADDING CAST COTTON 6X4 STRL (CAST SUPPLIES) ×4 IMPLANT
PENCIL SMOKE EVACUATOR (MISCELLANEOUS) ×2 IMPLANT
PIN DRILL FIX HALF THREAD (BIT) ×2 IMPLANT
PIN STEINMAN FIXATION KNEE (PIN) ×2 IMPLANT
PROTECTOR NERVE ULNAR (MISCELLANEOUS) ×3 IMPLANT
SET HNDPC FAN SPRY TIP SCT (DISPOSABLE) ×1 IMPLANT
SPONGE GAUZE 2X2 STER 10/PKG (GAUZE/BANDAGES/DRESSINGS) ×2
STRIP CLOSURE SKIN 1/2X4 (GAUZE/BANDAGES/DRESSINGS) ×4 IMPLANT
SUT MNCRL AB 4-0 PS2 18 (SUTURE) ×3 IMPLANT
SUT STRATAFIX 0 PDS 27 VIOLET (SUTURE) ×3
SUT VIC AB 2-0 CT1 27 (SUTURE) ×9
SUT VIC AB 2-0 CT1 TAPERPNT 27 (SUTURE) ×3 IMPLANT
SUTURE STRATFX 0 PDS 27 VIOLET (SUTURE) ×1 IMPLANT
TIBIAL BASE ROT PLAT SZ 5 KNEE (Knees) ×3 IMPLANT
TRAY FOLEY MTR SLVR 16FR STAT (SET/KITS/TRAYS/PACK) ×3 IMPLANT
WATER STERILE IRR 1000ML POUR (IV SOLUTION) ×6 IMPLANT
WRAP KNEE MAXI GEL POST OP (GAUZE/BANDAGES/DRESSINGS) ×3 IMPLANT
YANKAUER SUCT BULB TIP 10FT TU (MISCELLANEOUS) ×3 IMPLANT

## 2019-07-18 NOTE — Evaluation (Signed)
Physical Therapy Evaluation Patient Details Name: [place] MRN: [id number] DOB: Sep 11, 1950 Today's Date: 07/18/2019   History of Present Illness  Patient is 69 y.o. female s/p Lt TKA on 07/18/19 with PMH significant for HTN, OA, anemia, prior THA.  Clinical Impression  Van[place] is a 69 y.o. female POD 0 s/p Lt TKA. Patient reports independence with mobility at baseline. Patient is now limited by functional impairments (see PT problem list below) and requires min assist for transfers and gait with RW. Patient was able to ambulate ~60 feet with RW and min assist. Patient instructed in exercise to facilitate ROM and circulation. Patient will benefit from continued skilled PT interventions to address impairments and progress towards PLOF. Acute PT will follow to progress mobility and stair training in preparation for safe discharge home.     Follow Up Recommendations Follow surgeon's recommendation for DC plan and follow-up therapies;Home health PT(pt reports she is getting HHPT first then OPPT)    Equipment Recommendations  Rolling walker with 5" wheels    Recommendations for Other Services       Precautions / Restrictions Precautions Precautions: Fall Restrictions Weight Bearing Restrictions: No LLE Weight Bearing: Weight bearing as tolerated      Mobility  Bed Mobility Overal bed mobility: Needs Assistance Bed Mobility: Supine to Sit     Supine to sit: Min assist;HOB elevated     General bed mobility comments: cues for Lt arm to reach to bed rail, pt initiated bringing Lt LE to EOB, assist to raise trunk upright.  Transfers Overall transfer level: Needs assistance Equipment used: Rolling walker (2 wheeled) Transfers: Sit to/from Stand Sit to Stand: Min assist;From elevated surface         General transfer comment: cues for hand placement on RW and for power up technique, light assist to steady with rise.  Ambulation/Gait Ambulation/Gait assistance: Min  assist;Min guard Gait Distance (Feet): 60 Feet Assistive device: Rolling walker (2 wheeled) Gait Pattern/deviations: Step-to pattern;Decreased stride length Gait velocity: decreased   General Gait Details: cues fo safe hand placement and step pattern with RW. assist to position walker intermittently. pt improved throughout and maintained safe proximity during turn.  Stairs            Wheelchair Mobility    Modified Rankin (Stroke Patients Only)       Balance Overall balance assessment: Needs assistance Sitting-balance support: Feet supported Sitting balance-Leahy Scale: Good     Standing balance support: Bilateral upper extremity supported;During functional activity Standing balance-Leahy Scale: Fair            Pertinent Vitals/Pain Pain Assessment: 0-10 Pain Score: 7  Pain Location: Lt knee Pain Descriptors / Indicators: Aching;Discomfort;Dull;Sore Pain Intervention(s): Limited activity within patient's tolerance;Monitored during session;Repositioned;Ice applied    Home Living Family/patient expects to be discharged to:: Private residence Living Arrangements: Children(pt's son sometimes stays with her) Available Help at Discharge: Friend(s)(pt's friend "Dot" will stay with her) Type of Home: House Home Access: Stairs to enter Entrance Stairs-Rails: None Entrance Stairs-Number of Steps: 1 Home Layout: One level Home Equipment: Bedside commode(pt has this over her toilet to make it taller) Additional Comments: pt's friend "Dot" will stay with her    Prior Function Level of Independence: Independent               Hand Dominance   Dominant Hand: Right    Extremity/Trunk Assessment   Upper Extremity Assessment Upper Extremity Assessment: Overall WFL for tasks assessed  Lower Extremity Assessment Lower Extremity Assessment: LLE deficits/detail LLE Deficits / Details: pt with good quad activation, no extensor lag with SLR, 4/5 for quad  strength LLE Sensation: WNL LLE Coordination: WNL    Cervical / Trunk Assessment Cervical / Trunk Assessment: Normal  Communication   Communication: No difficulties  Cognition Arousal/Alertness: Awake/alert Behavior During Therapy: WFL for tasks assessed/performed Overall Cognitive Status: Within Functional Limits for tasks assessed           General Comments      Exercises Total Joint Exercises Ankle Circles/Pumps: AROM;Left;20 reps;Seated Quad Sets: AROM;Left;5 reps;Seated Heel Slides: AROM;Left;5 reps;Seated   Assessment/Plan    PT Assessment Patient needs continued PT services  PT Problem List Decreased strength;Decreased range of motion;Decreased activity tolerance;Decreased balance;Decreased mobility;Decreased knowledge of use of DME;Decreased knowledge of precautions       PT Treatment Interventions DME instruction;Gait training;Stair training;Functional mobility training;Therapeutic activities;Therapeutic exercise;Balance training;Patient/family education    PT Goals (Current goals can be found in the Care Plan section)  Acute Rehab PT Goals Patient Stated Goal: to walk more normally PT Goal Formulation: With patient Time For Goal Achievement: 07/25/19 Potential to Achieve Goals: Good    Frequency 7X/week    AM-PAC PT "6 Clicks" Mobility  Outcome Measure Help needed turning from your back to your side while in a flat bed without using bedrails?: None Help needed moving from lying on your back to sitting on the side of a flat bed without using bedrails?: A Little Help needed moving to and from a bed to a chair (including a wheelchair)?: A Little Help needed standing up from a chair using your arms (e.g., wheelchair or bedside chair)?: A Little Help needed to walk in hospital room?: A Little Help needed climbing 3-5 steps with a railing? : A Little 6 Click Score: 19    End of Session Equipment Utilized During Treatment: Gait belt Activity Tolerance:  Patient tolerated treatment well Patient left: in chair;with call bell/phone within reach;with chair alarm set;with family/visitor present Nurse Communication: Mobility status PT Visit Diagnosis: Muscle weakness (generalized) (M62.81);Difficulty in walking, not elsewhere classified (R26.2)    Time: 1610-9604 PT Time Calculation (min) (ACUTE ONLY): 28 min   Charges:   PT Evaluation $PT Eval Low Complexity: 1 Low PT Treatments $Therapeutic Exercise: 8-22 mins       Wynn Maudlin, DPT Physical Therapist with [facility] [phone]  07/18/2019 6:42 PM

## 2019-07-18 NOTE — Anesthesia Procedure Notes (Signed)
Anesthesia Regional Block: Adductor canal block   Pre-Anesthetic Checklist: ,, timeout performed, Correct Patient, Correct Site, Correct Laterality, Correct Procedure, Correct Position, site marked, Risks and benefits discussed,  Surgical consent,  Pre-op evaluation,  At surgeon's request and post-op pain management  Laterality: Left  Prep: chloraprep       Needles:  Injection technique: Single-shot  Needle Type: Echogenic Needle     Needle Length: 9cm  Needle Gauge: 21     Additional Needles:   Procedures:,,,, ultrasound used (permanent image in chart),,,,  Narrative:  Start time: 07/18/2019 9:19 AM End time: 07/18/2019 9:29 AM Injection made incrementally with aspirations every 5 mL.  Performed by: Personally  Anesthesiologist: Cecile Hearing, MD  Additional Notes: No pain on injection. No increased resistance to injection. Injection made in 5cc increments.  Good needle visualization.  Patient tolerated procedure well.

## 2019-07-18 NOTE — Interval H&P Note (Signed)
History and Physical Interval Note:  07/18/2019 8:23 AM  Vanessa [place]  has presented today for surgery, with the diagnosis of left knee osteoarthritis.  The various methods of treatment have been discussed with the patient and family. After consideration of risks, benefits and other options for treatment, the patient has consented to  Procedure(s): TOTAL KNEE ARTHROPLASTY (Left) as a surgical intervention.  The patient's history has been reviewed, patient examined, no change in status, stable for surgery.  I have reviewed the patient's chart and labs.  Questions were answered to the patient's satisfaction.     Homero Fellers Kapena Hamme

## 2019-07-18 NOTE — Op Note (Signed)
OPERATIVE REPORT-TOTAL KNEE ARTHROPLASTY   Pre-operative diagnosis- Osteoarthritis  Left knee(s)  Post-operative diagnosis- Osteoarthritis Left knee(s)  Procedure-  Left  Total Knee Arthroplasty  Surgeon- Gus Rankin. Mirka Barbone, MD  Assistant- Arther Abbott, PA-C   Anesthesia-  Adductor canal block and spinal  EBL-25 ml   Drains Hemovac  Tourniquet time-  Total Tourniquet Time Documented: Thigh (Left) - 41 minutes Total: Thigh (Left) - 41 minutes     Complications- None  Condition-PACU - hemodynamically stable.   Brief Clinical Note  Vanessa Blair is a 69 y.o. year old female with end stage OA of her left knee with progressively worsening pain and dysfunction. She has constant pain, with activity and at rest and significant functional deficits with difficulties even with ADLs. She has had extensive non-op management including analgesics, injections of cortisone and viscosupplements, and home exercise program, but remains in significant pain with significant dysfunction. Radiographs show bone on bone arthritis all 3 compartments with varus deformity. She presents now for left Total Knee Arthroplasty.    Procedure in detail---   The patient is brought into the operating room and positioned supine on the operating table. After successful administration of  Adductor canal block and spinal,   a tourniquet is placed high on the  Left thigh(s) and the lower extremity is prepped and draped in the usual sterile fashion. Time out is performed by the operating team and then the  Left lower extremity is wrapped in Esmarch, knee flexed and the tourniquet inflated to 300 mmHg.       A midline incision is made with a ten blade through the subcutaneous tissue to the level of the extensor mechanism. A fresh blade is used to make a medial parapatellar arthrotomy. Soft tissue over the proximal medial tibia is subperiosteally elevated to the joint line with a knife and into the semimembranosus bursa  with a Cobb elevator. Soft tissue over the proximal lateral tibia is elevated with attention being paid to avoiding the patellar tendon on the tibial tubercle. The patella is everted, knee flexed 90 degrees and the ACL and PCL are removed. Findings are bone on bone all 3 compartments with massive global osteophytes.        The drill is used to create a starting hole in the distal femur and the canal is thoroughly irrigated with sterile saline to remove the fatty contents. The 5 degree Left  valgus alignment guide is placed into the femoral canal and the distal femoral cutting block is pinned to remove 9 mm off the distal femur. Resection is made with an oscillating saw.      The tibia is subluxed forward and the menisci are removed. The extramedullary alignment guide is placed referencing proximally at the medial aspect of the tibial tubercle and distally along the second metatarsal axis and tibial crest. The block is pinned to remove 61mm off the more deficient medial  side. Resection is made with an oscillating saw. Size 5is the most appropriate size for the tibia and the proximal tibia is prepared with the modular drill and keel punch for that size.      The femoral sizing guide is placed and size 5 is most appropriate. Rotation is marked off the epicondylar axis and confirmed by creating a rectangular flexion gap at 90 degrees. The size 5 cutting block is pinned in this rotation and the anterior, posterior and chamfer cuts are made with the oscillating saw. The intercondylar block is then placed and that cut  is made.      Trial size 5 tibial component, trial size 5 posterior stabilized femur and a 10  mm posterior stabilized rotating platform insert trial is placed. Full extension is achieved with excellent varus/valgus and anterior/posterior balance throughout full range of motion. The patella is everted and thickness measured to be 22  mm. Free hand resection is taken to 12 mm, a 38 template is placed, lug  holes are drilled, trial patella is placed, and it tracks normally. Osteophytes are removed off the posterior femur with the trial in place. All trials are removed and the cut bone surfaces prepared with pulsatile lavage. Cement is mixed and once ready for implantation, the size 5 tibial implant, size  5 posterior stabilized femoral component, and the size 38 patella are cemented in place and the patella is held with the clamp. The trial insert is placed and the knee held in full extension. The Exparel (20 ml mixed with 60 ml saline) is injected into the extensor mechanism, posterior capsule, medial and lateral gutters and subcutaneous tissues.  All extruded cement is removed and once the cement is hard the permanent 10 mm posterior stabilized rotating platform insert is placed into the tibial tray.      The wound is copiously irrigated with saline solution and the extensor mechanism closed over a hemovac drain with #1 V-loc suture. The tourniquet is released for a total tourniquet time of 41  minutes. Flexion against gravity is 140 degrees and the patella tracks normally. Subcutaneous tissue is closed with 2.0 vicryl and subcuticular with running 4.0 Monocryl. The incision is cleaned and dried and steri-strips and a bulky sterile dressing are applied. The limb is placed into a knee immobilizer and the patient is awakened and transported to recovery in stable condition.      Please note that a surgical assistant was a medical necessity for this procedure in order to perform it in a safe and expeditious manner. Surgical assistant was necessary to retract the ligaments and vital neurovascular structures to prevent injury to them and also necessary for proper positioning of the limb to allow for anatomic placement of the prosthesis.   Vanessa Plover Eathel Pajak, MD    07/18/2019, 11:36 AM

## 2019-07-18 NOTE — Discharge Instructions (Addendum)
 Frank Aluisio, MD Total Joint Specialist [facility] [address] [place], [place] [address] [phone]  TOTAL KNEE REPLACEMENT POSTOPERATIVE DIRECTIONS    Knee Rehabilitation, Guidelines Following Surgery  Results after knee surgery are often greatly improved when you follow the exercise, range of motion and muscle strengthening exercises prescribed by your doctor. Safety measures are also important to protect the knee from further injury. If any of these exercises cause you to have increased pain or swelling in your knee joint, decrease the amount until you are comfortable again and slowly increase them. If you have problems or questions, call your caregiver or physical therapist for advice.   BLOOD CLOT PREVENTION . Take a 325 mg Aspirin two times a day for three weeks following surgery. Then resume one 81 mg Aspirin once a day. . You may resume your vitamins/supplements upon discharge from the hospital. . Do not take any NSAIDs (Advil, Aleve, Ibuprofen, Meloxicam, etc.) until you have discontinued the 325 mg Aspirin.  HOME CARE INSTRUCTIONS  . Remove items at home which could result in a fall. This includes throw rugs or furniture in walking pathways.  . ICE to the affected knee as much as tolerated. Icing helps control swelling. If the swelling is well controlled you will be more comfortable and rehab easier. Continue to use ice on the knee for pain and swelling from surgery. You may notice swelling that will progress down to the foot and ankle. This is normal after surgery. Elevate the leg when you are not up walking on it.    . Continue to use the breathing machine which will help keep your temperature down. It is common for your temperature to cycle up and down following surgery, especially at night when you are not up moving around and exerting yourself. The breathing machine keeps your lungs expanded and your temperature down. . Do not place pillow  under the operative knee, focus on keeping the knee straight while resting  DIET You may resume your previous home diet once you are discharged from the hospital.  DRESSING / WOUND CARE / SHOWERING . Keep your bulky bandage on for 2 days. On the third post-operative day you may remove the Ace bandage and gauze. There is a waterproof adhesive bandage on your skin which will stay in place until your first follow-up appointment. Once you remove this you will not need to place another bandage . You may begin showering 3 days following surgery, but do not submerge the incision under water.  ACTIVITY For the first 5 days, the key is rest and control of pain and swelling . Do your home exercises twice a day starting on post-operative day 3. On the days you go to physical therapy, just do the home exercises once that day. . You should rest, ice and elevate the leg for 50 minutes out of every hour. Get up and walk/stretch for 10 minutes per hour. After 5 days you can increase your activity slowly as tolerated. . Walk with your walker as instructed. Use the walker until you are comfortable transitioning to a cane. Walk with the cane in the opposite hand of the operative leg. You may discontinue the cane once you are comfortable and walking steadily. . Avoid periods of inactivity such as sitting longer than an hour when not asleep. This helps prevent blood clots.  . You may discontinue the knee immobilizer once you are able to perform a straight leg raise while lying down. .   You may resume a sexual relationship in one month or when given the OK by your doctor.  . You may return to work once you are cleared by your doctor.  . Do not drive a car for 6 weeks or until released by your surgeon.  . Do not drive while taking narcotics.  TED HOSE STOCKINGS Wear the elastic stockings on both legs for three weeks following surgery during the day. You may remove them at night for sleeping.  WEIGHT BEARING Weight  bearing as tolerated with assist device (walker, cane, etc) as directed, use it as long as suggested by your surgeon or therapist, typically at least 4-6 weeks.  POSTOPERATIVE CONSTIPATION PROTOCOL Constipation - defined medically as fewer than three stools per week and severe constipation as less than one stool per week.  One of the most common issues patients have following surgery is constipation.  Even if you have a regular bowel pattern at home, your normal regimen is likely to be disrupted due to multiple reasons following surgery.  Combination of anesthesia, postoperative narcotics, change in appetite and fluid intake all can affect your bowels.  In order to avoid complications following surgery, here are some recommendations in order to help you during your recovery period.  . Colace (docusate) - Pick up an over-the-counter form of Colace or another stool softener and take twice a day as long as you are requiring postoperative pain medications.  Take with a full glass of water daily.  If you experience loose stools or diarrhea, hold the colace until you stool forms back up. If your symptoms do not get better within 1 week or if they get worse, check with your doctor. . Dulcolax (bisacodyl) - Pick up over-the-counter and take as directed by the product packaging as needed to assist with the movement of your bowels.  Take with a full glass of water.  Use this product as needed if not relieved by Colace only.  . MiraLax (polyethylene glycol) - Pick up over-the-counter to have on hand. MiraLax is a solution that will increase the amount of water in your bowels to assist with bowel movements.  Take as directed and can mix with a glass of water, juice, soda, coffee, or tea. Take if you go more than two days without a movement. Do not use MiraLax more than once per day. Call your doctor if you are still constipated or irregular after using this medication for 7 days in a row.  If you continue to have  problems with postoperative constipation, please contact the office for further assistance and recommendations.  If you experience "the worst abdominal pain ever" or develop nausea or vomiting, please contact the office immediatly for further recommendations for treatment.  ITCHING If you experience itching with your medications, try taking only a single pain pill, or even half a pain pill at a time.  You can also use Benadryl over the counter for itching or also to help with sleep.   MEDICATIONS See your medication summary on the "After Visit Summary" that the nursing staff will review with you prior to discharge.  You may have some home medications which will be placed on hold until you complete the course of blood thinner medication.  It is important for you to complete the blood thinner medication as prescribed by your surgeon.  Continue your approved medications as instructed at time of discharge.  PRECAUTIONS . If you experience chest pain or shortness of breath - call 911 immediately for   transfer to the hospital emergency department.  . If you develop a fever greater that 101 F, purulent drainage from wound, increased redness or drainage from wound, foul odor from the wound/dressing, or calf pain - CONTACT YOUR SURGEON.                                                   FOLLOW-UP APPOINTMENTS Make sure you keep all of your appointments after your operation with your surgeon and caregivers. You should call the office at the above phone number and make an appointment for approximately two weeks after the date of your surgery or on the date instructed by your surgeon outlined in the "After Visit Summary".  RANGE OF MOTION AND STRENGTHENING EXERCISES  Rehabilitation of the knee is important following a knee injury or an operation. After just a few days of immobilization, the muscles of the thigh which control the knee become weakened and shrink (atrophy). Knee exercises are designed to build up the  tone and strength of the thigh muscles and to improve knee motion. Often times heat used for twenty to thirty minutes before working out will loosen up your tissues and help with improving the range of motion but do not use heat for the first two weeks following surgery. These exercises can be done on a training (exercise) mat, on the floor, on a table or on a bed. Use what ever works the best and is most comfortable for you Knee exercises include:  . Leg Lifts - While your knee is still immobilized in a splint or cast, you can do straight leg raises. Lift the leg to 60 degrees, hold for 3 sec, and slowly lower the leg. Repeat 10-20 times 2-3 times daily. Perform this exercise against resistance later as your knee gets better.  . Quad and Hamstring Sets - Tighten up the muscle on the front of the thigh (Quad) and hold for 5-10 sec. Repeat this 10-20 times hourly. Hamstring sets are done by pushing the foot backward against an object and holding for 5-10 sec. Repeat as with quad sets.   Leg Slides: Lying on your back, slowly slide your foot toward your buttocks, bending your knee up off the floor (only go as far as is comfortable). Then slowly slide your foot back down until your leg is flat on the floor again.  Angel Wings: Lying on your back spread your legs to the side as far apart as you can without causing discomfort.  A rehabilitation program following serious knee injuries can speed recovery and prevent re-injury in the future due to weakened muscles. Contact your doctor or a physical therapist for more information on knee rehabilitation.   IF YOU ARE TRANSFERRED TO A SKILLED REHAB FACILITY If the patient is transferred to a skilled rehab facility following release from the hospital, a list of the current medications will be sent to the facility for the patient to continue.  When discharged from the skilled rehab facility, please have the facility set up the patient's Home Health Physical Therapy  prior to being released. Also, the skilled facility will be responsible for providing the patient with their medications at time of release from the facility to include their pain medication, the muscle relaxants, and their blood thinner medication. If the patient is still at the rehab facility at time of the   two week follow up appointment, the skilled rehab facility will also need to assist the patient in arranging follow up appointment in our office and any transportation needs.  MAKE SURE YOU:  . Understand these instructions.  . Get help right away if you are not doing well or get worse.   DENTAL ANTIBIOTICS:  In most cases prophylactic antibiotics for Dental procdeures after total joint surgery are not necessary.  Exceptions are as follows:  1. History of prior total joint infection  2. Severely immunocompromised (Organ Transplant, cancer chemotherapy, Rheumatoid biologic meds such as Humera)  3. Poorly controlled diabetes (A1C &gt; 8.0, blood glucose over 200)  If you have one of these conditions, contact your surgeon for an antibiotic prescription, prior to your dental procedure.    Pick up stool softner and laxative for home use following surgery while on pain medications. Do not submerge incision under water. Please use good hand washing techniques while changing dressing each day. May shower starting three days after surgery. Please use a clean towel to pat the incision dry following showers. Continue to use ice for pain and swelling after surgery. Do not use any lotions or creams on the incision until instructed by your surgeon.  

## 2019-07-18 NOTE — Plan of Care (Signed)
Plan of care reviewed and discussed with the patient. 

## 2019-07-18 NOTE — Anesthesia Postprocedure Evaluation (Signed)
Anesthesia Post Note  Patient: Vanessa Blair  Procedure(s) Performed: TOTAL KNEE ARTHROPLASTY (Left Knee)     Patient location during evaluation: PACU Anesthesia Type: Spinal Level of consciousness: oriented and awake and alert Pain management: pain level controlled Vital Signs Assessment: post-procedure vital signs reviewed and stable Respiratory status: spontaneous breathing, respiratory function stable and nonlabored ventilation Cardiovascular status: blood pressure returned to baseline and stable Postop Assessment: no headache, no backache, no apparent nausea or vomiting, patient able to bend at knees and spinal receding Anesthetic complications: no    Last Vitals:  Vitals:   07/18/19 1425 07/18/19 1632  BP: 117/87 137/81  Pulse:  80  Resp: 16 16  Temp: 36.6 C 36.8 C  SpO2:  99%    Last Pain:  Vitals:   07/18/19 1632  TempSrc: Oral  PainSc:                  Cecile Hearing

## 2019-07-18 NOTE — Progress Notes (Signed)
Orthopedic Tech Progress Note Patient Details:  CLARESSA HUGHLEY 1951-03-07 [id number]  CPM Left Knee CPM Left Knee: Off Left Knee Flexion (Degrees): 40 Left Knee Extension (Degrees): 10  Post Interventions Patient Tolerated: Well Instructions Provided: Care of device  Saul Fordyce 07/18/2019, 4:23 PM

## 2019-07-18 NOTE — Anesthesia Procedure Notes (Signed)
Date/Time: 07/18/2019 10:17 AM Performed by: Minerva Ends, CRNA Pre-anesthesia Checklist: Patient identified, Emergency Drugs available, Suction available, Timeout performed and Patient being monitored Patient Re-evaluated:Patient Re-evaluated prior to induction Oxygen Delivery Method: Simple face mask Placement Confirmation: positive ETCO2 and breath sounds checked- equal and bilateral Dental Injury: Teeth and Oropharynx as per pre-operative assessment

## 2019-07-18 NOTE — Anesthesia Procedure Notes (Signed)
Spinal  Patient location during procedure: OR End time: 07/18/2019 10:30 AM Staffing Performed: resident/CRNA  Resident/CRNA: Minerva Ends, CRNA Preanesthetic Checklist Completed: patient identified, IV checked, site marked, risks and benefits discussed, surgical consent, monitors and equipment checked, pre-op evaluation and timeout performed Spinal Block Patient position: sitting Prep: ChloraPrep and site prepped and draped Patient monitoring: heart rate, continuous pulse ox, blood pressure and cardiac monitor Approach: midline Location: L3-4 Injection technique: single-shot Needle Needle type: Sprotte  Needle gauge: 24 G Assessment Sensory level: T6 Additional Notes Expiration date of tray noted and within date.   Patient tolerated procedure well. Prep dry at time of insertion-- Desmond Lope present assisted and supervised

## 2019-07-18 NOTE — Progress Notes (Signed)
Orthopedic Tech Progress Note Patient Details:  GEET HOSKING 1950-10-21 [id number]  CPM Left Knee CPM Left Knee: On Left Knee Flexion (Degrees): 40 Left Knee Extension (Degrees): 10  Post Interventions Patient Tolerated: Well Instructions Provided: Care of device  Saul Fordyce 07/18/2019, 12:24 PM

## 2019-07-18 NOTE — Transfer of Care (Signed)
Immediate Anesthesia Transfer of Care Note  Patient: Vanessa Blair  Procedure(s) Performed: TOTAL KNEE ARTHROPLASTY (Left Knee)  Patient Location: PACU  Anesthesia Type:Spinal  Level of Consciousness: awake and alert   Airway & Oxygen Therapy: Patient Spontanous Breathing and Patient connected to face mask oxygen  Post-op Assessment: Report given to RN and Post -op Vital signs reviewed and stable  Post vital signs: Reviewed and stable  Last Vitals:  Vitals Value Taken Time  BP    Temp    Pulse    Resp 14 07/18/19 1206  SpO2    Vitals shown include unvalidated device data.  Last Pain:  Vitals:   07/18/19 0838  TempSrc:   PainSc: 5       Patients Stated Pain Goal: 4 (07/18/19 6720)  Complications: No apparent anesthesia complications

## 2019-07-18 NOTE — Anesthesia Preprocedure Evaluation (Addendum)
Anesthesia Evaluation  Patient identified by MRN, date of birth, ID band Patient awake    Reviewed: Allergy & Precautions, NPO status , Patient's Chart, lab work & pertinent test results  History of Anesthesia Complications Negative for: history of anesthetic complications  Airway Mallampati: I  TM Distance: >3 FB Neck ROM: Full    Dental  (+) Dental Advisory Given, Upper Dentures, Partial Lower   Pulmonary neg pulmonary ROS,    Pulmonary exam normal breath sounds clear to auscultation       Cardiovascular hypertension, Pt. on medications and Pt. on home beta blockers (-) angina(-) CAD and (-) Past MI Normal cardiovascular exam Rhythm:Regular Rate:Normal     Neuro/Psych negative neurological ROS  negative psych ROS   GI/Hepatic Neg liver ROS, GERD  Medicated and Controlled,  Endo/Other  Obesity   Renal/GU negative Renal ROS     Musculoskeletal  (+) Arthritis  (left knee osteoarthritis), Osteoarthritis,    Abdominal   Peds  Hematology negative hematology ROS (+) Plt 277k   Anesthesia Other Findings Day of surgery medications reviewed with the patient.  Reproductive/Obstetrics                            Anesthesia Physical Anesthesia Plan  ASA: II  Anesthesia Plan: Spinal   Post-op Pain Management:  Regional for Post-op pain   Induction: Intravenous  PONV Risk Score and Plan: 2 and Propofol infusion and Treatment may vary due to age or medical condition  Airway Management Planned: Natural Airway and Nasal Cannula  Additional Equipment:   Intra-op Plan:   Post-operative Plan:   Informed Consent: I have reviewed the patients History and Physical, chart, labs and discussed the procedure including the risks, benefits and alternatives for the proposed anesthesia with the patient or authorized representative who has indicated his/her understanding and acceptance.     Dental  advisory given  Plan Discussed with: CRNA, Anesthesiologist and Surgeon  Anesthesia Plan Comments:         Anesthesia Quick Evaluation

## 2019-07-18 NOTE — Progress Notes (Signed)
AssistedDr. Turk with left, ultrasound guided, adductor canal block. Side rails up, monitors on throughout procedure. See vital signs in flow sheet. Tolerated Procedure well.  

## 2019-07-19 DIAGNOSIS — M1712 Unilateral primary osteoarthritis, left knee: Secondary | ICD-10-CM | POA: Diagnosis not present

## 2019-07-19 LAB — BASIC METABOLIC PANEL
Anion gap: 6 (ref 5–15)
BUN: 11 mg/dL (ref 8–23)
CO2: 27 mmol/L (ref 22–32)
Calcium: 8.6 mg/dL — ABNORMAL LOW (ref 8.9–10.3)
Chloride: 107 mmol/L (ref 98–111)
Creatinine, Ser: 0.62 mg/dL (ref 0.44–1.00)
GFR calc Af Amer: >60 mL/min (ref >60)
GFR calc non Af Amer: >60 mL/min (ref >60)
Glucose, Bld: 127 mg/dL — ABNORMAL HIGH (ref 70–99)
Potassium: 3.7 mmol/L (ref 3.5–5.1)
Sodium: 140 mmol/L (ref 135–145)

## 2019-07-19 LAB — CBC
HCT: 28.8 % — ABNORMAL LOW (ref 36.0–46.0)
Hemoglobin: 9.2 g/dL — ABNORMAL LOW (ref 12.0–15.0)
MCH: 29.8 pg (ref 26.0–34.0)
MCHC: 31.9 g/dL (ref 30.0–36.0)
MCV: 93.2 fL (ref 80.0–100.0)
Platelets: 195 10*3/uL (ref 150–400)
RBC: 3.09 MIL/uL — ABNORMAL LOW (ref 3.87–5.11)
RDW: 13.1 % (ref 11.5–15.5)
WBC: 7.7 10*3/uL (ref 4.0–10.5)
nRBC: 0 % (ref 0.0–0.2)

## 2019-07-19 MED ORDER — ONDANSETRON HCL 4 MG PO TABS: 4.0000 mg | ORAL_TABLET | Freq: Four times a day (QID) | ORAL | 0 refills | Status: DC | PRN

## 2019-07-19 NOTE — Progress Notes (Signed)
Physical Therapy Treatment Patient Details Name: Vanessa Blair MRN: [id number] DOB: 1950/04/23 Today's Date: 07/19/2019    History of Present Illness Patient is 69 y.o. female s/p Lt TKA on 07/18/19 with PMH significant for HTN, OA, anemia, prior THA.    PT Comments    Progressing well with mobility.    Follow Up Recommendations  Follow surgeon's recommendation for DC plan and follow-up therapies     Equipment Recommendations  Rolling walker with 5" wheels    Recommendations for Other Services       Precautions / Restrictions Precautions Precautions: Fall Restrictions Weight Bearing Restrictions: No LLE Weight Bearing: Weight bearing as tolerated    Mobility  Bed Mobility               General bed mobility comments: oob in recliner  Transfers Overall transfer level: Needs assistance Equipment used: Rolling walker (2 wheeled) Transfers: Sit to/from Stand Sit to Stand: Min guard         General transfer comment: for safety. VCs safety, hand/LE placement.  Ambulation/Gait Ambulation/Gait assistance: Min guard Gait Distance (Feet): 115 Feet Assistive device: Rolling walker (2 wheeled) Gait Pattern/deviations: Step-to pattern;Step-through pattern;Decreased stride length     General Gait Details: for safety. Pt quickly transitioned to reciprocal gait pattern. no lob with rw use.   Stairs             Wheelchair Mobility    Modified Rankin (Stroke Patients Only)       Balance Overall balance assessment: Mild deficits observed, not formally tested         Standing balance support: Bilateral upper extremity supported Standing balance-Leahy Scale: Fair                              Cognition Arousal/Alertness: Awake/alert Behavior During Therapy: WFL for tasks assessed/performed Overall Cognitive Status: Within Functional Limits for tasks assessed                                        Exercises Total Joint  Exercises Ankle Circles/Pumps: AROM;Both;10 reps Quad Sets: AROM;Both;10 reps Heel Slides: AAROM;Left;10 reps Hip ABduction/ADduction: AROM;Left;10 reps Straight Leg Raises: AROM;Left;10 reps Goniometric ROM: ~10-75 degrees    General Comments        Pertinent Vitals/Pain Pain Assessment: 0-10 Pain Score: 8  Pain Location: L knee Pain Descriptors / Indicators: Aching;Discomfort;Dull;Sore Pain Intervention(s): Monitored during session;Ice applied;Repositioned    Home Living                      Prior Function            PT Goals (current goals can now be found in the care plan section) Progress towards PT goals: Progressing toward goals    Frequency    7X/week      PT Plan Current plan remains appropriate    Co-evaluation              AM-PAC PT "6 Clicks" Mobility   Outcome Measure  Help needed turning from your back to your side while in a flat bed without using bedrails?: None Help needed moving from lying on your back to sitting on the side of a flat bed without using bedrails?: A Little Help needed moving to and from a bed to a chair (including a wheelchair)?: A Little Help  needed standing up from a chair using your arms (e.g., wheelchair or bedside chair)?: A Little Help needed to walk in hospital room?: A Little Help needed climbing 3-5 steps with a railing? : A Little 6 Click Score: 19    End of Session Equipment Utilized During Treatment: Gait belt Activity Tolerance: Patient tolerated treatment well Patient left: in chair;with call bell/phone within reach;with family/visitor present;with chair alarm set   PT Visit Diagnosis: Other abnormalities of gait and mobility (R26.89);Pain Pain - Right/Left: Left Pain - part of body: Knee     Time: 1783-7542 PT Time Calculation (min) (ACUTE ONLY): 29 min  Charges:  $Gait Training: 8-22 mins $Therapeutic Exercise: 8-22 mins                         Faye Ramsay, PT Acute Rehabilitation

## 2019-07-19 NOTE — Progress Notes (Signed)
   Subjective: 1 Day Post-Op Procedure(s) (LRB): TOTAL KNEE ARTHROPLASTY (Left) Patient reports pain as mild.   Patient seen in rounds by Dr. Lequita Halt. Patient is well, and has had no acute complaints or problems We will continue therapy today.   Objective: Vital signs in last 24 hours: Temp:  [97.6 F (36.4 C)-98.3 F (36.8 C)] 98.2 F (36.8 C) (04/13 0541) Pulse Rate:  [64-87] 85 (04/13 0541) Resp:  [12-22] 17 (04/13 0541) BP: (112-140)/(77-91) 115/79 (04/13 0541) SpO2:  [96 %-100 %] 100 % (04/13 0541) Weight:  [106.8 kg] 106.8 kg (04/12 0838)  Intake/Output from previous day:  Intake/Output Summary (Last 24 hours) at 07/19/2019 0728 Last data filed at 07/19/2019 0650 Gross per 24 hour  Intake 3074.82 ml  Output 4790 ml  Net -1715.18 ml     Intake/Output this shift: No intake/output data recorded.  Labs: Recent Labs    07/19/19 0409  HGB 9.2*   Recent Labs    07/19/19 0409  WBC 7.7  RBC 3.09*  HCT 28.8*  PLT 195   Recent Labs    07/19/19 0409  NA 140  K 3.7  CL 107  CO2 27  BUN 11  CREATININE 0.62  GLUCOSE 127*  CALCIUM 8.6*   No results for input(s): LABPT, INR in the last 72 hours.  Exam: General - Patient is Alert and Oriented Extremity - Neurologically intact Neurovascular intact Sensation intact distally Dorsiflexion/Plantar flexion intact Dressing - dressing C/D/I Motor Function - intact, moving foot and toes well on exam.   Past Medical History:  Diagnosis Date  . Anemia   . Arthritis   . Hypercholesteremia   . Hypertension     Assessment/Plan: 1 Day Post-Op Procedure(s) (LRB): TOTAL KNEE ARTHROPLASTY (Left) Principal Problem:   OA (osteoarthritis) of knee Active Problems:   Primary osteoarthritis of left knee  Estimated body mass index is 35.82 kg/m as calculated from the following:   Height as of this encounter: 5\' 8"  (1.727 m).   Weight as of this encounter: 106.8 kg. Advance diet Up with therapy D/C IV  fluids   Patient's anticipated LOS is less than 2 midnights, meeting these requirements: - Younger than 11 - Lives within 1 hour of care - Has a competent adult at home to recover with post-op recover - NO history of  - Chronic pain requiring opiods  - Diabetes  - Coronary Artery Disease  - Heart failure  - Heart attack  - Stroke  - DVT/VTE  - Cardiac arrhythmia  - Respiratory Failure/COPD  - Renal failure  - Anemia  - Advanced Liver disease  DVT Prophylaxis - Aspirin Weight bearing as tolerated. D/C O2 and pulse ox and try on room air. Hemovac pulled without difficulty, will continue therapy today.  Plan is to go Home after hospital stay. Plan for discharge tomorrow pending progress with therapy. Will require an additional day to improve independence due to limited assistance at home.  75, PA-C Orthopedic Surgery [phone] 07/19/2019, 7:28 AM

## 2019-07-19 NOTE — Plan of Care (Signed)
  Problem: Education: Goal: Knowledge of the prescribed therapeutic regimen will improve Outcome: Progressing   Problem: Activity: Goal: Ability to avoid complications of mobility impairment will improve Outcome: Progressing   Problem: Clinical Measurements: Goal: Postoperative complications will be avoided or minimized Outcome: Progressing   Problem: Skin Integrity: Goal: Will show signs of wound healing Outcome: Progressing   Problem: Health Behavior/Discharge Planning: Goal: Ability to manage health-related needs will improve Outcome: Progressing   Problem: Clinical Measurements: Goal: Ability to maintain clinical measurements within normal limits will improve Outcome: Progressing Goal: Will remain free from infection Outcome: Progressing Goal: Diagnostic test results will improve Outcome: Progressing Goal: Respiratory complications will improve Outcome: Progressing Goal: Cardiovascular complication will be avoided Outcome: Progressing   Problem: Clinical Measurements: Goal: Diagnostic test results will improve Outcome: Progressing   Problem: Coping: Goal: Level of anxiety will decrease Outcome: Progressing   Problem: Activity: Goal: Risk for activity intolerance will decrease Outcome: Progressing

## 2019-07-19 NOTE — Progress Notes (Signed)
Physical Therapy Treatment Patient Details Name: Vanessa Blair MRN: [id number] DOB: 1950-08-24 Today's Date: 07/19/2019    History of Present Illness Patient is 69 y.o. female s/p Lt TKA on 07/18/19 with PMH significant for HTN, OA, anemia, prior THA.    PT Comments    Progressing well with mobility. Plan is for d/c home on tomorrow if pt continues to do well.    Follow Up Recommendations  Follow surgeon's recommendation for DC plan and follow-up therapies     Equipment Recommendations  Rolling walker with 5" wheels    Recommendations for Other Services       Precautions / Restrictions Precautions Precautions: Fall Restrictions Weight Bearing Restrictions: No LLE Weight Bearing: Weight bearing as tolerated    Mobility  Bed Mobility               General bed mobility comments: oob in recliner  Transfers Overall transfer level: Needs assistance Equipment used: Rolling walker (2 wheeled) Transfers: Sit to/from Stand Sit to Stand: Min guard            Ambulation/Gait Ambulation/Gait assistance: Min guard Gait Distance (Feet): 125 Feet Assistive device: Rolling walker (2 wheeled) Gait Pattern/deviations: Step-through pattern;Decreased stride length         Stairs             Wheelchair Mobility    Modified Rankin (Stroke Patients Only)       Balance Overall balance assessment: Mild deficits observed, not formally tested                                          Cognition Arousal/Alertness: Awake/alert Behavior During Therapy: WFL for tasks assessed/performed Overall Cognitive Status: Within Functional Limits for tasks assessed                                        Exercises      General Comments        Pertinent Vitals/Pain Pain Assessment: 0-10 Pain Score: 5  Pain Location: L knee Pain Descriptors / Indicators: Aching;Discomfort;Dull;Sore Pain Intervention(s): Monitored during session     Home Living                      Prior Function            PT Goals (current goals can now be found in the care plan section) Progress towards PT goals: Progressing toward goals    Frequency    7X/week      PT Plan Current plan remains appropriate    Co-evaluation              AM-PAC PT "6 Clicks" Mobility   Outcome Measure  Help needed turning from your back to your side while in a flat bed without using bedrails?: None Help needed moving from lying on your back to sitting on the side of a flat bed without using bedrails?: A Little Help needed moving to and from a bed to a chair (including a wheelchair)?: A Little Help needed standing up from a chair using your arms (e.g., wheelchair or bedside chair)?: A Little Help needed to walk in hospital room?: A Little Help needed climbing 3-5 steps with a railing? : A Little 6 Click Score: 19    End  of Session Equipment Utilized During Treatment: Gait belt Activity Tolerance: Patient tolerated treatment well Patient left: in chair;with call bell/phone within reach;with chair alarm set   PT Visit Diagnosis: Other abnormalities of gait and mobility (R26.89);Pain Pain - Right/Left: Left Pain - part of body: Knee     Time: 2763-9432 PT Time Calculation (min) (ACUTE ONLY): 15 min  Charges:  $Gait Training: 8-22 mins                         Faye Ramsay, PT Acute Rehabilitation

## 2019-07-19 NOTE — Progress Notes (Signed)
Patient dressing (ace bandage and bulky dressing) saturated after hemovac removal and ambulation. Removed saturated dressings and reapplied/ reinforced new kerlix/ bulky dressing and ace wrap. aquacel is dry clean and intact. Patient denies dizziness. Call bell within reach. Chair alarm on- patient ambulating with one assist.

## 2019-07-20 ENCOUNTER — Encounter: Payer: Self-pay | Admitting: *Deleted

## 2019-07-20 DIAGNOSIS — M1712 Unilateral primary osteoarthritis, left knee: Secondary | ICD-10-CM | POA: Diagnosis not present

## 2019-07-20 LAB — BASIC METABOLIC PANEL
Anion gap: 9 (ref 5–15)
BUN: 13 mg/dL (ref 8–23)
CO2: 24 mmol/L (ref 22–32)
Calcium: 8.6 mg/dL — ABNORMAL LOW (ref 8.9–10.3)
Chloride: 108 mmol/L (ref 98–111)
Creatinine, Ser: 0.69 mg/dL (ref 0.44–1.00)
GFR calc Af Amer: >60 mL/min (ref >60)
GFR calc non Af Amer: >60 mL/min (ref >60)
Glucose, Bld: 93 mg/dL (ref 70–99)
Potassium: 3.5 mmol/L (ref 3.5–5.1)
Sodium: 141 mmol/L (ref 135–145)

## 2019-07-20 LAB — CBC
HCT: 27.8 % — ABNORMAL LOW (ref 36.0–46.0)
Hemoglobin: 8.9 g/dL — ABNORMAL LOW (ref 12.0–15.0)
MCH: 30.2 pg (ref 26.0–34.0)
MCHC: 32 g/dL (ref 30.0–36.0)
MCV: 94.2 fL (ref 80.0–100.0)
Platelets: 202 10*3/uL (ref 150–400)
RBC: 2.95 MIL/uL — ABNORMAL LOW (ref 3.87–5.11)
RDW: 13.2 % (ref 11.5–15.5)
WBC: 9.5 10*3/uL (ref 4.0–10.5)
nRBC: 0 % (ref 0.0–0.2)

## 2019-07-20 MED ORDER — GABAPENTIN 300 MG PO CAPS: 300.0000 mg | ORAL_CAPSULE | Freq: Three times a day (TID) | ORAL | 0 refills | Status: AC

## 2019-07-20 MED ORDER — ASPIRIN 325 MG PO TBEC: 325.0000 mg | DELAYED_RELEASE_TABLET | Freq: Two times a day (BID) | ORAL | 0 refills | Status: AC

## 2019-07-20 MED ORDER — METHOCARBAMOL 500 MG PO TABS: 500.0000 mg | ORAL_TABLET | Freq: Four times a day (QID) | ORAL | 0 refills | Status: DC | PRN

## 2019-07-20 MED ORDER — TRAMADOL HCL 50 MG PO TABS: 50.0000 mg | ORAL_TABLET | Freq: Four times a day (QID) | ORAL | 0 refills | Status: DC | PRN

## 2019-07-20 MED ORDER — OXYCODONE HCL 5 MG PO TABS: 5.0000 mg | ORAL_TABLET | Freq: Four times a day (QID) | ORAL | 0 refills | Status: DC | PRN

## 2019-07-20 NOTE — Progress Notes (Signed)
   Subjective: 2 Days Post-Op Procedure(s) (LRB): TOTAL KNEE ARTHROPLASTY (Left) Patient reports pain as mild.   Patient seen in rounds by Dr. Lequita Halt. Patient is well, and has had no acute complaints or problems other than discomfort in the left knee. No acute events overnight. Voiding without difficulty, positive flatus. Patient denies CP, SHOB, N/V. She states she is ready to go home today.  Plan is to go Home after hospital stay.  Objective: Vital signs in last 24 hours: Temp:  [97.7 F (36.5 C)-98.7 F (37.1 C)] 98.7 F (37.1 C) (04/14 0605) Pulse Rate:  [66-99] 89 (04/14 0605) Resp:  [14-17] 14 (04/14 0605) BP: (111-125)/(70-86) 117/72 (04/14 0605) SpO2:  [96 %-100 %] 96 % (04/14 0605)  Intake/Output from previous day:  Intake/Output Summary (Last 24 hours) at 07/20/2019 0736 Last data filed at 07/20/2019 0600 Gross per 24 hour  Intake 1510 ml  Output 700 ml  Net 810 ml    Intake/Output this shift: No intake/output data recorded.  Labs: Recent Labs    07/19/19 0409 07/20/19 0300  HGB 9.2* 8.9*   Recent Labs    07/19/19 0409 07/20/19 0300  WBC 7.7 9.5  RBC 3.09* 2.95*  HCT 28.8* 27.8*  PLT 195 202   Recent Labs    07/19/19 0409 07/20/19 0300  NA 140 141  K 3.7 3.5  CL 107 108  CO2 27 24  BUN 11 13  CREATININE 0.62 0.69  GLUCOSE 127* 93  CALCIUM 8.6* 8.6*   No results for input(s): LABPT, INR in the last 72 hours.  Exam: General - Patient is Alert and Oriented Extremity - Neurologically intact Sensation intact distally Intact pulses distally Dorsiflexion/Plantar flexion intact Dressing/Incision - clean, dry Motor Function - intact, moving foot and toes well on exam.   Past Medical History:  Diagnosis Date  . Anemia   . Arthritis   . Hypercholesteremia   . Hypertension     Assessment/Plan: 2 Days Post-Op Procedure(s) (LRB): TOTAL KNEE ARTHROPLASTY (Left) Principal Problem:   OA (osteoarthritis) of knee Active Problems:   Primary  osteoarthritis of left knee  Estimated body mass index is 35.82 kg/m as calculated from the following:   Height as of this encounter: 5\' 8"  (1.727 m).   Weight as of this encounter: 106.8 kg. Advance diet Up with therapy D/C IV fluids  DVT Prophylaxis - Aspirin Weight-bearing as tolerated  Plan for discharge home today following 1 session of PT as long as she is doing well and continues to meet her goals. Scheduled for OPPT. She will leave the aquacel dressing in place until follow up. Follow up in the office in 2 weeks.   , PA-C Orthopedic Surgery [phone] 07/20/2019, 7:36 AM

## 2019-07-20 NOTE — Plan of Care (Signed)
Pt ready for DC home today 

## 2019-07-20 NOTE — Progress Notes (Signed)
Physical Therapy Treatment Patient Details Name: Vanessa Blair MRN: [id number] DOB: Sep 03, 1950 Today's Date: 07/20/2019    History of Present Illness Patient is 69 y.o. female s/p Lt TKA on 07/18/19 with PMH significant for HTN, OA, anemia, prior THA.    PT Comments    Progressing well with mobility. Reviewed/practiced exercises, gait training, and stair training. All education completed. Okay to d/c from PT standpoint.    Follow Up Recommendations  Follow surgeon's recommendation for DC plan and follow-up therapies     Equipment Recommendations       Recommendations for Other Services       Precautions / Restrictions Precautions Precautions: Fall Restrictions Weight Bearing Restrictions: No LLE Weight Bearing: Weight bearing as tolerated    Mobility  Bed Mobility Overal bed mobility: Needs Assistance Bed Mobility: Supine to Sit;Sit to Supine     Supine to sit: Supervision;HOB elevated Sit to supine: Supervision;HOB elevated      Transfers Overall transfer level: Needs assistance Equipment used: Rolling walker (2 wheeled) Transfers: Sit to/from Stand Sit to Stand: Supervision            Ambulation/Gait Ambulation/Gait assistance: Supervision Gait Distance (Feet): 125 Feet Assistive device: Rolling walker (2 wheeled) Gait Pattern/deviations: Step-through pattern;Decreased stride length         Stairs Stairs: Yes Stairs assistance: Min guard Stair Management: Step to pattern;Forwards;With walker Number of Stairs: 1 General stair comments: VCs safety, technqiue, sequence. Min guard for safety.   Wheelchair Mobility    Modified Rankin (Stroke Patients Only)       Balance Overall balance assessment: Mild deficits observed, not formally tested                                          Cognition Arousal/Alertness: Awake/alert Behavior During Therapy: WFL for tasks assessed/performed Overall Cognitive Status: Within  Functional Limits for tasks assessed                                        Exercises Total Joint Exercises Ankle Circles/Pumps: AROM;Both;10 reps Quad Sets: AROM;Both;10 reps Heel Slides: AAROM;Left;10 reps Hip ABduction/ADduction: AROM;Left;10 reps Straight Leg Raises: AROM;Left;10 reps Goniometric ROM: ~10-70 degrees    General Comments        Pertinent Vitals/Pain Pain Assessment: 0-10 Pain Score: 5  Pain Location: L knee Pain Descriptors / Indicators: Aching;Discomfort;Sore Pain Intervention(s): Monitored during session;Ice applied    Home Living                      Prior Function            PT Goals (current goals can now be found in the care plan section) Progress towards PT goals: Progressing toward goals    Frequency    7X/week      PT Plan Current plan remains appropriate    Co-evaluation              AM-PAC PT "6 Clicks" Mobility   Outcome Measure  Help needed turning from your back to your side while in a flat bed without using bedrails?: None Help needed moving from lying on your back to sitting on the side of a flat bed without using bedrails?: None Help needed moving to and from a bed to a chair (  including a wheelchair)?: A Little Help needed standing up from a chair using your arms (e.g., wheelchair or bedside chair)?: A Little Help needed to walk in hospital room?: A Little Help needed climbing 3-5 steps with a railing? : A Little 6 Click Score: 20    End of Session Equipment Utilized During Treatment: Gait belt Activity Tolerance: Patient tolerated treatment well Patient left: with call bell/phone within reach;in bed   PT Visit Diagnosis: Other abnormalities of gait and mobility (R26.89);Pain Pain - Right/Left: Left Pain - part of body: Knee     Time: 3893-7342 PT Time Calculation (min) (ACUTE ONLY): 25 min  Charges:  $Gait Training: 8-22 mins $Therapeutic Exercise: 8-22 mins                          Doreatha Massed, PT Acute Rehabilitation

## 2019-07-20 NOTE — TOC Progression Note (Signed)
Transition of Care [facility]) - Progression Note    Patient Details  Name: Vanessa Blair MRN: [id number] Date of Birth: 1950/07/28  Transition of Care [facility]) CM/SW [place], [place] Phone Number: 07/20/2019, 8:11 AM  Clinical Narrative:    CSW met with with the patient at bedside to discuss Home Health options. Patient reports she is to have PT at home for only two weeks and then transition to [place]. Patient reports her goal is to walk without pain.  CSW provided list from Medicare.gov and offered choice. Patient reports she was familiar with [facility] and preferred to use them for PT services. CSW reached out to [facility] and confirm staff availability.  Patient RW ordered through [employer] and delivered to room on 4/13.     Barriers to Discharge: Barriers Resolved  Expected Discharge Plan and Services           Expected Discharge Date: 07/20/19               DME Arranged: Gilford Rile rolling DME Agency: [employer] Date DME Agency Contacted: 07/19/19 Time DME Agency Contacted: 0900 Representative spoke with at DME Agency: Elna Breslow Arranged: PT [place] Agency: [place] Date [place]: 07/19/19 Time [place]: [id number] Representative spoke with at [place]: [place] ([place]) Interventions    Readmission Risk Interventions No flowsheet data found.

## 2019-07-21 NOTE — Discharge Summary (Signed)
Physician Discharge Summary   Patient ID: Vanessa Blair MRN: [id number] DOB/AGE: 1950-06-29 69 y.o.  Admit date: 07/18/2019 Discharge date: 07/20/2019  Primary Diagnosis: Osteoarthritis Left knee(s)  Admission Diagnoses:  Past Medical History:  Diagnosis Date  . Anemia   . Arthritis   . Hypercholesteremia   . Hypertension    Discharge Diagnoses:   Principal Problem:   OA (osteoarthritis) of knee Active Problems:   Primary osteoarthritis of left knee  Estimated body mass index is 35.82 kg/m as calculated from the following:   Height as of this encounter: 5\' 8"  (1.727 m).   Weight as of this encounter: 106.8 kg.  Procedure:  Procedure(s) (LRB): TOTAL KNEE ARTHROPLASTY (Left)   Consults: None  HPI:  Vanessa Blair is a 69 y.o. year old female with end stage OA of her left knee with progressively worsening pain and dysfunction. She has constant pain, with activity and at rest and significant functional deficits with difficulties even with ADLs. She has had extensive non-op management including analgesics, injections of cortisone and viscosupplements, and home exercise program, but remains in significant pain with significant dysfunction. Radiographs show bone on bone arthritis all 3 compartments with varus deformity. She presents now for left Total Knee Arthroplasty.    Laboratory Data: Admission on 07/18/2019, Discharged on 07/20/2019  Component Date Value Ref Range Status  . WBC 07/19/2019 7.7  4.0 - 10.5 K/uL Final  . RBC 07/19/2019 3.09* 3.87 - 5.11 MIL/uL Final  . Hemoglobin 07/19/2019 9.2* 12.0 - 15.0 g/dL Final  . HCT 07/19/2019 28.8* 36.0 - 46.0 % Final  . MCV 07/19/2019 93.2  80.0 - 100.0 fL Final  . MCH 07/19/2019 29.8  26.0 - 34.0 pg Final  . MCHC 07/19/2019 31.9  30.0 - 36.0 g/dL Final  . RDW 07/19/2019 13.1  11.5 - 15.5 % Final  . Platelets 07/19/2019 195  150 - 400 K/uL Final  . nRBC 07/19/2019 0.0  0.0 - 0.2 % Final   Performed at [facility], [place] [address], [place], [place] [address]  . Sodium 07/19/2019 140  135 - 145 mmol/L Final  . Potassium 07/19/2019 3.7  3.5 - 5.1 mmol/L Final  . Chloride 07/19/2019 107  98 - 111 mmol/L Final  . CO2 07/19/2019 27  22 - 32 mmol/L Final  . Glucose, Bld 07/19/2019 127* 70 - 99 mg/dL Final   Glucose reference range applies only to samples taken after fasting for at least 8 hours.  . BUN 07/19/2019 11  8 - 23 mg/dL Final  . Creatinine, Ser 07/19/2019 0.62  0.44 - 1.00 mg/dL Final  . Calcium 07/19/2019 8.6* 8.9 - 10.3 mg/dL Final  . GFR calc non Af Amer 07/19/2019 >60  >60 mL/min Final  . GFR calc Af Amer 07/19/2019 >60  >60 mL/min Final  . Anion gap 07/19/2019 6  5 - 15 Final   Performed at [facility], [place] [address], [place], [place] [address]  . WBC 07/20/2019 9.5  4.0 - 10.5 K/uL Final  . RBC 07/20/2019 2.95* 3.87 - 5.11 MIL/uL Final  . Hemoglobin 07/20/2019 8.9* 12.0 - 15.0 g/dL Final  . HCT 07/20/2019 27.8* 36.0 - 46.0 % Final  . MCV 07/20/2019 94.2  80.0 - 100.0 fL Final  . MCH 07/20/2019 30.2  26.0 - 34.0 pg Final  . MCHC 07/20/2019 32.0  30.0 - 36.0 g/dL Final  . RDW 07/20/2019 13.2  11.5 - 15.5 % Final  . Platelets 07/20/2019  202  150 - 400 K/uL Final  . nRBC 07/20/2019 0.0  0.0 - 0.2 % Final   Performed at [facility], [address], [place], [place] [address]  . Sodium 07/20/2019 141  135 - 145 mmol/L Final  . Potassium 07/20/2019 3.5  3.5 - 5.1 mmol/L Final  . Chloride 07/20/2019 108  98 - 111 mmol/L Final  . CO2 07/20/2019 24  22 - 32 mmol/L Final  . Glucose, Bld 07/20/2019 93  70 - 99 mg/dL Final   Glucose reference range applies only to samples taken after fasting for at least 8 hours.  . BUN 07/20/2019 13  8 - 23 mg/dL Final  . Creatinine, Ser 07/20/2019 0.69  0.44 - 1.00 mg/dL Final  . Calcium 69/48/5462 8.6* 8.9 - 10.3 mg/dL Final  . GFR calc non Af Amer 07/20/2019 >60  >60 mL/min Final  . GFR calc Af Amer  07/20/2019 >60  >60 mL/min Final  . Anion gap 07/20/2019 9  5 - 15 Final   Performed at [facility], [address] [place], [place] [address]  Hospital Outpatient Visit on 07/14/2019  Component Date Value Ref Range Status  . SARS Coronavirus 2 07/14/2019 NEGATIVE  NEGATIVE Final   Comment: (NOTE) SARS-CoV-2 target nucleic acids are NOT DETECTED. The SARS-CoV-2 RNA is generally detectable in upper and lower respiratory specimens during the acute phase of infection. Negative results do not preclude SARS-CoV-2 infection, do not rule out co-infections with other pathogens, and should not be used as the sole basis for treatment or other patient management decisions. Negative results must be combined with clinical observations, patient history, and epidemiological information. The expected result is Negative. Fact Sheet for Patients: HairSlick.no Fact Sheet for Healthcare Providers: quierodirigir.com This test is not yet approved or cleared by the [place] FDA and  has been authorized for detection and/or diagnosis of SARS-CoV-2 by FDA under an Emergency Use Authorization (EUA). This EUA will remain  in effect (meaning this test can be used) for the duration of the COVID-19 declaration under Section 56                          4(b)(1) of the Act, 21 U.S.C. section 360bbb-3(b)(1), unless the authorization is terminated or revoked sooner. Performed at [facility], [address] [place], [place] [address]   Hospital Outpatient Visit on 07/07/2019  Component Date Value Ref Range Status  . aPTT 07/07/2019 33  24 - 36 seconds Final   Performed at [facility], [address], [place], [place] [address]  . WBC 07/07/2019 5.0  4.0 - 10.5 K/uL Final  . RBC 07/07/2019 4.05  3.87 - 5.11 MIL/uL Final  . Hemoglobin 07/07/2019 12.1  12.0 - 15.0 g/dL Final  . HCT 71/69/6789 38.4  36.0 - 46.0 %  Final  . MCV 07/07/2019 94.8  80.0 - 100.0 fL Final  . MCH 07/07/2019 29.9  26.0 - 34.0 pg Final  . MCHC 07/07/2019 31.5  30.0 - 36.0 g/dL Final  . RDW 38/01/1750 13.1  11.5 - 15.5 % Final  . Platelets 07/07/2019 277  150 - 400 K/uL Final  . nRBC 07/07/2019 0.0  0.0 - 0.2 % Final   Performed at [facility], [address], [place], [place] [address]  . Sodium 07/07/2019 145  135 - 145 mmol/L Final  . Potassium 07/07/2019 4.2  3.5 - 5.1 mmol/L Final  .  Chloride 07/07/2019 108  98 - 111 mmol/L Final  . CO2 07/07/2019 27  22 - 32 mmol/L Final  . Glucose, Bld 07/07/2019 142* 70 - 99 mg/dL Final   Glucose reference range applies only to samples taken after fasting for at least 8 hours.  . BUN 07/07/2019 19  8 - 23 mg/dL Final  . Creatinine, Ser 07/07/2019 0.91  0.44 - 1.00 mg/dL Final  . Calcium 94/70/9628 9.7  8.9 - 10.3 mg/dL Final  . Total Protein 07/07/2019 7.9  6.5 - 8.1 g/dL Final  . Albumin 36/62/9476 4.0  3.5 - 5.0 g/dL Final  . AST 54/65/0354 21  15 - 41 U/L Final  . ALT 07/07/2019 13  0 - 44 U/L Final  . Alkaline Phosphatase 07/07/2019 72  38 - 126 U/L Final  . Total Bilirubin 07/07/2019 0.7  0.3 - 1.2 mg/dL Final  . GFR calc non Af Amer 07/07/2019 >60  >60 mL/min Final  . GFR calc Af Amer 07/07/2019 >60  >60 mL/min Final  . Anion gap 07/07/2019 10  5 - 15 Final   Performed at [facility], [address], [place], [place] [address]  . Prothrombin Time 07/07/2019 13.4  11.4 - 15.2 seconds Final  . INR 07/07/2019 1.0  0.8 - 1.2 Final   Comment: (NOTE) INR goal varies based on device and disease states. Performed at [facility], [address] [place], [place] [address]   . ABO/RH(D) 07/07/2019 O POS   Final  . Antibody Screen 07/07/2019 NEG   Final  . Sample Expiration 07/07/2019 07/21/2019,2359   Final  . Extend sample reason 07/07/2019    Final                   Value:NO TRANSFUSIONS OR PREGNANCY IN THE PAST 3  MONTHS Performed at [facility], [address], [place], [place] [address]   . MRSA, PCR 07/07/2019 NEGATIVE  NEGATIVE Final  . Staphylococcus aureus 07/07/2019 NEGATIVE  NEGATIVE Final   Comment: (NOTE) The Xpert SA Assay (FDA approved for NASAL specimens in patients 79 years of age and older), is one component of a comprehensive surveillance program. It is not intended to diagnose infection nor to guide or monitor treatment. Performed at [facility], [address] [place], [place] [address]   . ABO/RH(D) 07/07/2019    Final                   Value:O POS Performed at [facility], [address] [place], [place] [address]      X-Rays:No results found.  EKG:No orders found for this or any previous visit.   Hospital Course: Vanessa Blair is a 69 y.o. who was admitted to [facility]. They were brought to the operating room on 07/18/2019 and underwent Procedure(s): TOTAL KNEE ARTHROPLASTY.  Patient tolerated the procedure well and was later transferred to the recovery room and then to the orthopaedic floor for postoperative care. They were given PO and IV analgesics for pain control following their surgery. They were given 24 hours of postoperative antibiotics of  Anti-infectives (From admission, onward)   Start     Dose/Rate Route Frequency Ordered Stop   07/18/19 1700  ceFAZolin (ANCEF) IVPB 2g/100 mL premix     2 g 200 mL/hr over 30 Minutes Intravenous Every 6 hours 07/18/19 1311 07/18/19 2332   07/18/19 0815  ceFAZolin (ANCEF) IVPB 2g/100 mL premix  2 g 200 mL/hr over 30 Minutes Intravenous On call to O.R. 07/18/19 [id number] 07/18/19 1033     and started on DVT prophylaxis in the form of Aspirin.   PT and OT were ordered for total joint protocol. Discharge planning consulted to help with postop disposition and equipment needs. Patient had a good night on the evening of surgery. They started to get up OOB with  therapy on POD #0. Hemovac drain was pulled without difficulty on day one. Continued to work with therapy into POD #2. Pt was seen during rounds on day two and was ready to go home pending progress with therapy. Dressing was changed and the incision was clean and intact. Pt worked with therapy for an additional session and was meeting their goals. She was discharged to home later that day in stable condition.  Diet: Regular diet Activity: WBAT Follow-up: in 2 weeks Disposition: Home Discharged Condition: good   Discharge Instructions    Call MD / Call 911   Complete by: As directed    If you experience chest pain or shortness of breath, CALL 911 and be transported to the hospital emergency room.  If you develope a fever above 101 F, pus (white drainage) or increased drainage or redness at the wound, or calf pain, call your surgeon's office.   Change dressing   Complete by: As directed    You may remove the bulky bandage (ACE wrap and gauze) two days after surgery. You will have an adhesive waterproof bandage underneath. Leave this in place until your first follow-up appointment.   Constipation Prevention   Complete by: As directed    Drink plenty of fluids.  Prune juice may be helpful.  You may use a stool softener, such as Colace (over the counter) 100 mg twice a day.  Use MiraLax (over the counter) for constipation as needed.   Diet - low sodium heart healthy   Complete by: As directed    Do not put a pillow under the knee. Place it under the heel.   Complete by: As directed    Driving restrictions   Complete by: As directed    No driving for two weeks   TED hose   Complete by: As directed    Use stockings (TED hose) for three weeks on both leg(s).  You may remove them at night for sleeping.   Weight bearing as tolerated   Complete by: As directed      Allergies as of 07/20/2019      Reactions   Vicodin [hydrocodone-acetaminophen]       Medication List    STOP taking these  medications   meloxicam 15 MG tablet Commonly known as: MOBIC     TAKE these medications   ALPRAZolam 0.25 MG tablet Commonly known as: XANAX Take 0.25 mg by mouth at bedtime.   aspirin 325 MG EC tablet Take 1 tablet (325 mg total) by mouth 2 (two) times daily for 28 days. Then resume home dose of aspirin 81 mg daily. What changed:   medication strength  how much to take  when to take this  additional instructions   calcium carbonate 1250 (500 Ca) MG tablet Commonly known as: OS-CAL - dosed in mg of elemental calcium Take 1 tablet by mouth daily with breakfast.   cholecalciferol 25 MCG (1000 UNIT) tablet Commonly known as: VITAMIN D3 Take 1,000 Units by mouth daily.   esomeprazole 40 MG capsule Commonly known as: NEXIUM Take 40 mg by mouth  daily.   fluticasone 50 MCG/ACT nasal spray Commonly known as: FLONASE Place 1 spray into both nostrils daily as needed for allergies.   gabapentin 300 MG capsule Commonly known as: NEURONTIN Take 1 capsule (300 mg total) by mouth 3 (three) times daily. Take a 300 mg capsule three times a day for two weeks following surgery.Then take a 300 mg capsule two times a day for two weeks. Then take a 300 mg capsule once a day for two weeks. Then discontinue the Gabapentin.   lisinopril-hydrochlorothiazide 20-25 MG tablet Commonly known as: ZESTORETIC Take 1 tablet by mouth daily.   methocarbamol 500 MG tablet Commonly known as: ROBAXIN Take 1 tablet (500 mg total) by mouth every 6 (six) hours as needed for muscle spasms.   metoprolol tartrate 50 MG tablet Commonly known as: LOPRESSOR Take 50 mg by mouth 2 (two) times daily.   multivitamin with minerals Tabs tablet Take 1 tablet by mouth daily.   ondansetron 4 MG tablet Commonly known as: ZOFRAN Take 1 tablet (4 mg total) by mouth every 6 (six) hours as needed for nausea.   oxyCODONE 5 MG immediate release tablet Commonly known as: Oxy IR/ROXICODONE Take 1-2 tablets (5-10 mg  total) by mouth every 6 (six) hours as needed for severe pain.   simvastatin 20 MG tablet Commonly known as: ZOCOR Take 20 mg by mouth at bedtime.   tobramycin 0.3 % ophthalmic solution Commonly known as: TOBREX Place 1 drop into the left eye See admin instructions. Instill one drop into the left eye four times daily the day of and the day after eye injection   traMADol 50 MG tablet Commonly known as: ULTRAM Take 1-2 tablets (50-100 mg total) by mouth every 6 (six) hours as needed for moderate pain.   vitamin B-12 1000 MCG tablet Commonly known as: CYANOCOBALAMIN Take 1,000 mcg by mouth daily.            Discharge Care Instructions  (From admission, onward)         Start     Ordered   07/20/19 0000  Weight bearing as tolerated     07/20/19 0742   07/20/19 0000  Change dressing    Comments: You may remove the bulky bandage (ACE wrap and gauze) two days after surgery. You will have an adhesive waterproof bandage underneath. Leave this in place until your first follow-up appointment.   07/20/19 7510         Follow-up Information    Ollen Gross, MD. Schedule an appointment as soon as possible for a visit on 08/02/2019.   Specialty: Orthopedic Surgery Contact information: [address] [place] [address] [phone]        Care, [facility] Follow up.   Specialty: Home Health Services Contact information: [address] [place] [address] [phone]           Signed: Dennie Bible, PA-C Orthopedic Surgery 07/21/2019, 8:08 AM

## 2019-07-25 ENCOUNTER — Ambulatory Visit: Payer: Medicare Other | Attending: Student | Admitting: Physical Therapy

## 2019-08-16 ENCOUNTER — Other Ambulatory Visit: Payer: Self-pay

## 2019-08-16 ENCOUNTER — Encounter: Payer: Self-pay | Admitting: Physical Therapy

## 2019-08-16 ENCOUNTER — Ambulatory Visit: Payer: Medicare Other | Attending: Student | Admitting: Physical Therapy

## 2019-08-16 DIAGNOSIS — M25562 Pain in left knee: Secondary | ICD-10-CM

## 2019-08-16 DIAGNOSIS — R600 Localized edema: Secondary | ICD-10-CM | POA: Diagnosis present

## 2019-08-16 DIAGNOSIS — M6281 Muscle weakness (generalized): Secondary | ICD-10-CM | POA: Insufficient documentation

## 2019-08-16 NOTE — Therapy (Signed)
[facility] [address] [place], [place], [address] Phone: [phone]   Fax:  [phone]  Physical Therapy Evaluation  Patient Details  Name: SUSAN BLEICH MRN: [id number] Date of Birth: 11-24-1950 Referring Provider (PT): Arther Abbott   Encounter Date: 08/16/2019  PT End of Session - 08/16/19 1302    Visit Number  1    Date for PT Re-Evaluation  10/16/19    PT Start Time  1055    PT Stop Time  1150    PT Time Calculation (min)  55 min    Activity Tolerance  Patient tolerated treatment well    Behavior During Therapy  [facility] for tasks assessed/performed       Past Medical History:  Diagnosis Date  . Anemia   . Arthritis   . Hypercholesteremia   . Hypertension     Past Surgical History:  Procedure Laterality Date  . BREAST SURGERY    . TOTAL HIP ARTHROPLASTY    . 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 TUBAL LIGATION      There were no vitals filed for this visit.   Subjective Assessment - 08/16/19 1052    Subjective  Pt reports to clinic s/p L TKA 07/18/2019. Pt has been receiving HH PT for the past 3 weeks. Pt reports she has been walking down the street with her PT and doing knee ex's in standing. Pt has been practicing walking with and without AD. Before this pt did not use an AD at all and would like to return to walking without one.    Pertinent History  R THA ~20 yrs ago    Limitations  Lifting;Standing;Walking;House hold activities    Patient Stated Goals  wants to be able to clean her house again, be able to take a bath in the tub again,    Currently in Pain?  Yes    Pain Score  5     Pain Location  Knee    Pain Orientation  Left    Pain Descriptors / Indicators  Sharp;Aching;Tightness    Pain Type  Surgical pain    Pain Onset  More than a month ago    Pain  Frequency  Intermittent    Aggravating Factors   bending, squatting, weight bearing    Pain Relieving Factors  nsaids, ice         [facility] PT Assessment - 08/16/19 0001      Assessment   Medical Diagnosis  L TKA    Referring Provider (PT)  Arther Abbott    Onset Date/Surgical Date  07/18/19    Next MD Visit  08/23/2019    Prior Therapy  PT for R THA      Precautions   Precautions  None      Restrictions   Weight Bearing Restrictions  No      Balance Screen   Has the patient fallen in the past 6 months  No    Has the patient had a decrease in activity level because of a fear of falling?   No    Is the patient reluctant to leave their home because of a fear of falling?   No      Home Environment   Living Environment  Private residence    Additional Comments  1 step to enter home; reports  no trouble with stair      Prior Function   Level of Independence  Independent    Vocation  Retired    Leisure  gardening      Observation/Other Assessments-Edema    Edema  Circumferential      Circumferential Edema   Circumferential - Right  45.5 cm mid patella    Circumferential - Left   51.5 cm mid patella      Sensation   Light Touch  Appears Intact      ROM / Strength   AROM / PROM / Strength  AROM;PROM;Strength      AROM   AROM Assessment Site  Knee    Right/Left Knee  Right;Left    Left Knee Extension  3    Left Knee Flexion  90      PROM   PROM Assessment Site  Knee    Right/Left Knee  Right;Left    Left Knee Extension  1    Left Knee Flexion  108                Objective measurements completed on examination: See above findings.      [place] Adult PT Treatment/Exercise - 08/16/19 0001      Standardized Balance Assessment   Standardized Balance Assessment  Timed Up and Go Test      Timed Up and Go Test   TUG  Normal TUG    Normal TUG (seconds)  19.09   with SBQC; 15.86 w/out AD     Exercises   Exercises  Knee/Hip      Knee/Hip Exercises:  Stretches   Active Hamstring Stretch  Both;2 reps;60 seconds    Active Hamstring Stretch Limitations  seated    Knee: Self-Stretch to increase Flexion  2 reps;30 seconds    Other Knee/Hip Stretches  knee extension with LLE propped on chair x10 hold 3 sec      Modalities   Modalities  Vasopneumatic      Vasopneumatic   Number Minutes Vasopneumatic   15 minutes    Vasopnuematic Location   Knee    Vasopneumatic Pressure  Medium    Vasopneumatic Temperature   36             PT Education - 08/16/19 1301    Education Details  Pt educated on condition, rehab process, and HEP    Person(s) Educated  Patient    Methods  Explanation;Demonstration;Handout    Comprehension  Verbalized understanding;Returned demonstration       PT Short Term Goals - 08/16/19 1312      PT SHORT TERM GOAL #1   Title  Pt will be independent with HEP    Time  2    Period  Weeks    Status  New    Target Date  08/30/19        PT Long Term Goals - 08/16/19 1312      PT LONG TERM GOAL #1   Title  Pt will demonstrate L knee flexion AROM to 110 deg    Baseline  90 AROM    Time  8    Period  Weeks    Status  New    Target Date  10/11/19      PT LONG TERM GOAL #2   Title  Pt will demonstrate full knee extension    Baseline  3 deg    Time  8    Period  Weeks    Target Date  10/11/19      PT LONG TERM GOAL #3   Title  Pt will demonstrate ability to ambulate 1 lap around building without AD    Baseline  ambulating with [facility]    Time  8    Period  Weeks    Target Date  10/11/19      PT LONG TERM GOAL #4   Title  Pt will demonstrate TUG without AD <13 sec    Baseline  15.59 sec    Time  8    Target Date  10/11/19      PT LONG TERM GOAL #5   Title  Pt will demonstrate <3cm difference in swelling between R and L knee    Baseline  >5cm difference    Time  8    Period  Weeks    Status  New    Target Date  10/11/19             Plan - 08/16/19 1304    Clinical Impression Statement   Pt presents to clinic s/p L TKA 07/18/2019; pt has been receiving HH for the past 3 weeks. Pt demonstrates antalgic gait on L and increased TUG time. Pt nearing full knee extension; AROM knee flexion 90, PROM knee flexion 108. Pt would benefit from skilled PT to address ROM deficits, LE strength deficits, functional activities, and swelling of L knee.    Personal Factors and Comorbidities  Age;Comorbidity 2    Comorbidities  arthritis, HTN    Examination-Activity Limitations  Bend;Carry;Lift;Stand;Stairs;Squat    Examination-Participation Restrictions  Meal Prep;Cleaning;Community Activity;Shop;Laundry    Stability/Clinical Decision Making  Stable/Uncomplicated    Clinical Decision Making  Moderate    Rehab Potential  Good    PT Frequency  2x / week    PT Duration  8 weeks    PT Treatment/Interventions  ADLs/Self Care Home Management;Cryotherapy;Electrical Stimulation;Gait training;Stair training;Functional mobility training;Therapeutic activities;Therapeutic exercise;Balance training;Neuromuscular re-education;Manual techniques;Patient/family education;Scar mobilization;Passive range of motion    PT Next Visit Plan  Initiate LE strengthening/flexibility ex's, manual/modalities as indicated    PT Home Exercise Plan  from [facility] PT: standing abduction, knee flexion, hip flexion; added: knee flexion self stretch seated, hamstring stretch, extension stretch with quad activation    Consulted and Agree with Plan of Care  Patient       Patient will benefit from skilled therapeutic intervention in order to improve the following deficits and impairments:  Abnormal gait, Decreased range of motion, Difficulty walking, Decreased endurance, Decreased activity tolerance, Pain, Decreased balance, Hypomobility, Impaired flexibility, Decreased mobility, Decreased strength, Increased edema  Visit Diagnosis: Muscle weakness (generalized)  Acute pain of left knee  Localized edema     Problem List Patient  Active Problem List   Diagnosis Date Noted  . OA (osteoarthritis) of knee 07/18/2019  . Primary osteoarthritis of left knee 07/18/2019   Lysle Rubens, PT, DPT Maryanna Shape Bilan Tedesco 08/16/2019, 1:59 PM  [facility] [address] [place], [place], [address] Phone: [phone]   Fax:  [phone]  Name: LYVONNE CASSELL MRN: [id number] Date of Birth: 12/17/1950

## 2019-08-16 NOTE — Patient Instructions (Signed)
Access Code: [email or web address] URL: [email or web address] Date: 08/16/2019 Prepared by: Lysle Rubens  Exercises Seated Knee Flexion Stretch - 1 x daily - 7 x weekly - 3 sets - 3 reps - 20 sec hold Seated Knee Extension Stretch with Chair - 1 x daily - 7 x weekly - 10 reps - 3 sets Seated Hamstring Stretch - 1 x daily - 7 x weekly - 3 sets - 2 reps - 30 sec hold

## 2019-08-24 ENCOUNTER — Ambulatory Visit: Payer: Medicare Other | Admitting: Physical Therapy

## 2019-08-24 ENCOUNTER — Other Ambulatory Visit: Payer: Self-pay

## 2019-08-24 ENCOUNTER — Encounter: Payer: Self-pay | Admitting: Physical Therapy

## 2019-08-24 DIAGNOSIS — M6281 Muscle weakness (generalized): Secondary | ICD-10-CM | POA: Diagnosis not present

## 2019-08-24 DIAGNOSIS — M25562 Pain in left knee: Secondary | ICD-10-CM

## 2019-08-24 DIAGNOSIS — R600 Localized edema: Secondary | ICD-10-CM

## 2019-08-24 NOTE — Therapy (Signed)
[place], [place], [address] Phone: [phone]   Fax:  [phone]  Physical Therapy Treatment  Patient Details  Name: Vanessa Blair MRN: [id number] Date of Birth: 09-07-1950 Referring Provider (PT): Theresa Duty   Encounter Date: 08/24/2019  PT End of Session - 08/24/19 1522    Visit Number  2    Date for PT Re-Evaluation  10/16/19    PT Start Time  7544    PT Stop Time  1528    PT Time Calculation (min)  49 min    Activity Tolerance  Patient tolerated treatment well    Behavior During Therapy  [facility] for tasks assessed/performed       Past Medical History:  Diagnosis Date  . Anemia   . Arthritis   . Hypercholesteremia   . Hypertension     Past Surgical History:  Procedure Laterality Date  . BREAST SURGERY    . TOTAL HIP ARTHROPLASTY    . 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 TUBAL LIGATION      There were no vitals filed for this visit.  Subjective Assessment - 08/24/19 1442    Subjective  "I feel good"    Currently in Pain?  No/denies         [facility] PT Assessment - 08/24/19 0001      AROM   Left Knee Extension  2    Left Knee Flexion  99                    [facility] Adult PT Treatment/Exercise - 08/24/19 0001      Knee/Hip Exercises: Aerobic   Nustep  L3 x 5 min       Knee/Hip Exercises: Machines for Strengthening   Cybex Leg Press  20lb 2x10       Knee/Hip Exercises: Standing   Forward Step Up  Left;3 sets;5 sets;Hand Hold: 1;Step Height: 4";Step Height: 6"      Knee/Hip Exercises: Seated   Long Arc Quad  Left;2 sets;10 reps;Weights    Long Arc Quad Weight  2 lbs.    Hamstring Curl  Left;2 sets;10 reps    Hamstring Limitations  Green Tband       Modalities   Modalities  Vasopneumatic      Vasopneumatic   Number Minutes Vasopneumatic   10 minutes    Vasopnuematic Location   Knee    Vasopneumatic Pressure  Medium    Vasopneumatic Temperature   36      Manual Therapy   Manual Therapy  Passive ROM    Manual therapy comments  Some PROM taken to end range and held    Passive ROM  L knee flexion and Ext               PT Short Term Goals - 08/24/19 1533      PT SHORT TERM GOAL #1   Title  Pt will be independent with HEP    Status  Achieved        PT Long Term Goals - 08/24/19 1533      PT LONG TERM GOAL #1   Title  Pt will demonstrate L knee flexion AROM to 110 deg    Status  On-going      PT LONG TERM GOAL #2   Title  Pt will demonstrate full knee extension    Status  Partially Met      PT LONG TERM GOAL #3   Title  Pt will demonstrate ability to ambulate 1 lap around building without AD    Status  On-going      PT LONG TERM GOAL #4   Title  Pt will demonstrate TUG without AD <13 sec    Status  On-going            Plan - 08/24/19 1533    Clinical Impression Statement  Pt tolerated an initial progression to TE well. She has progressed a little increasing her L knee AROM. No reports of increase pain. Cues needed slow down and complete the exercises through the full ROM, Some compensation noted with step ups. Overall pt is doing really well    Personal Factors and Comorbidities  Age;Comorbidity 2    Comorbidities  arthritis, HTN    Examination-Activity Limitations  Bend;Carry;Lift;Stand;Stairs;Squat    Examination-Participation Restrictions  Meal Prep;Cleaning;Community Activity;Shop;Laundry    Stability/Clinical Decision Making  Stable/Uncomplicated    Rehab Potential  Good    PT Frequency  2x / week    PT Duration  8 weeks    PT Treatment/Interventions  ADLs/Self Care Home Management;Cryotherapy;Electrical Stimulation;Gait training;Stair training;Functional mobility training;Therapeutic activities;Therapeutic exercise;Balance training;Neuromuscular  re-education;Manual techniques;Patient/family education;Scar mobilization;Passive range of motion    PT Next Visit Plan  Initiate LE strengthening/flexibility ex's, manual/modalities as indicated    PT Home Exercise Plan  Functional strengthening       Patient will benefit from skilled therapeutic intervention in order to improve the following deficits and impairments:  Abnormal gait, Decreased range of motion, Difficulty walking, Decreased endurance, Decreased activity tolerance, Pain, Decreased balance, Hypomobility, Impaired flexibility, Decreased mobility, Decreased strength, Increased edema  Visit Diagnosis: Acute pain of left knee  Localized edema  Muscle weakness (generalized)     Problem List Patient Active Problem List   Diagnosis Date Noted  . OA (osteoarthritis) of knee 07/18/2019  . Primary osteoarthritis of left knee 07/18/2019    Scot Jun, PTA 08/24/2019, 3:36 PM  [place], [place], [address] Phone: [phone]   Fax:  [phone]  Name: Vanessa Blair MRN: [id number] Date of Birth: April 08, 1950

## 2019-08-30 ENCOUNTER — Ambulatory Visit: Payer: Medicare Other | Admitting: Physical Therapy

## 2019-08-30 ENCOUNTER — Other Ambulatory Visit: Payer: Self-pay

## 2019-08-30 ENCOUNTER — Encounter: Payer: Self-pay | Admitting: Physical Therapy

## 2019-08-30 DIAGNOSIS — M6281 Muscle weakness (generalized): Secondary | ICD-10-CM | POA: Diagnosis not present

## 2019-08-30 DIAGNOSIS — R600 Localized edema: Secondary | ICD-10-CM

## 2019-08-30 DIAGNOSIS — M25562 Pain in left knee: Secondary | ICD-10-CM

## 2019-08-30 NOTE — Therapy (Signed)
[place], [place], [address] Phone: [phone]   Fax:  [phone]  Physical Therapy Treatment  Patient Details  Name: Vanessa Blair MRN: [id number] Date of Birth: [birth year] Referring Provider (PT): Theresa Duty   Encounter Date: 08/30/2019  PT End of Session - 08/30/19 1056    Visit Number  3    Date for PT Re-Evaluation  10/16/19    PT Start Time  1020    PT Stop Time  1107    PT Time Calculation (min)  47 min    Activity Tolerance  Patient tolerated treatment well    Behavior During Therapy  [facility] for tasks assessed/performed       Past Medical History:  Diagnosis Date  . Anemia   . Arthritis   . Hypercholesteremia   . Hypertension     Past Surgical History:  Procedure Laterality Date  . BREAST SURGERY    . TOTAL HIP ARTHROPLASTY    . 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 TUBAL LIGATION      There were no vitals filed for this visit.  Subjective Assessment - 08/30/19 1020    Subjective  "I am doing good" Went to the grocery store yesterday using cart and did real good    Currently in Pain?  Yes    Pain Score  4     Pain Location  Knee    Pain Orientation  Left;Lateral                        [facility] Adult PT Treatment/Exercise - 08/30/19 0001      Knee/Hip Exercises: Aerobic   Recumbent Bike  L0 x 3 min  seat 9    Nustep  L3 x 5 min       Knee/Hip Exercises: Machines for Strengthening   Cybex Leg Press  20lb 2x10       Knee/Hip Exercises: Standing   Forward Step Up  Left;5 sets;Step Height: 6";10 reps;1 set   some compensation    Walking with Sports Cord  30lb 4 way x 3 each      Knee/Hip Exercises: Seated   Long Arc Quad  Left;2 sets;10 reps;Weights    Long Arc Quad Weight  3 lbs.    Hamstring Curl  Left;10  reps;3 sets    Hamstring Limitations  Green Tband       Modalities   Modalities  Vasopneumatic      Vasopneumatic   Number Minutes Vasopneumatic   10 minutes    Vasopnuematic Location   Knee    Vasopneumatic Pressure  Medium    Vasopneumatic Temperature   36      Manual Therapy   Manual Therapy  Passive ROM    Manual therapy comments  Some PROM taken to end range and held    Passive ROM  L knee flexion and Ext               PT Short Term Goals - 08/24/19 1533      PT SHORT TERM GOAL #1   Title  Pt will be independent with HEP    Status  Achieved        PT Long Term Goals - 08/24/19 1533      PT LONG TERM GOAL #1  Title  Pt will demonstrate L knee flexion AROM to 110 deg    Status  On-going      PT LONG TERM GOAL #2   Title  Pt will demonstrate full knee extension    Status  Partially Met      PT LONG TERM GOAL #3   Title  Pt will demonstrate ability to ambulate 1 lap around building without AD    Status  On-going      PT LONG TERM GOAL #4   Title  Pt will demonstrate TUG without AD <13 sec    Status  On-going            Plan - 08/30/19 1057    Clinical Impression Statement  Pt 10 minutes late for today's session, She reports going to the store yesterday without AD using cart for support and did fin, but did have some increase fatigue later on. Some warmth noted around L knee. She did well with resisted gait requiring cues to control resistance. Some pain noted at the end range of passive range of motion. Some compensation noted with step ups.    Personal Factors and Comorbidities  Age;Comorbidity 2    Comorbidities  arthritis, HTN    Examination-Activity Limitations  Bend;Carry;Lift;Stand;Stairs;Squat    Examination-Participation Restrictions  Meal Prep;Cleaning;Community Activity;Shop;Laundry    Stability/Clinical Decision Making  Stable/Uncomplicated    Rehab Potential  Good    PT Frequency  2x / week    PT Duration  8 weeks    PT  Treatment/Interventions  ADLs/Self Care Home Management;Cryotherapy;Electrical Stimulation;Gait training;Stair training;Functional mobility training;Therapeutic activities;Therapeutic exercise;Balance training;Neuromuscular re-education;Manual techniques;Patient/family education;Scar mobilization;Passive range of motion    PT Next Visit Plan  Initiate LE strengthening/flexibility ex's, manual/modalities as indicated       Patient will benefit from skilled therapeutic intervention in order to improve the following deficits and impairments:  Abnormal gait, Decreased range of motion, Difficulty walking, Decreased endurance, Decreased activity tolerance, Pain, Decreased balance, Hypomobility, Impaired flexibility, Decreased mobility, Decreased strength, Increased edema  Visit Diagnosis: Muscle weakness (generalized)  Localized edema  Acute pain of left knee     Problem List Patient Active Problem List   Diagnosis Date Noted  . OA (osteoarthritis) of knee 07/18/2019  . Primary osteoarthritis of left knee 07/18/2019    Scot Jun, PTA 08/30/2019, 10:59 AM  [place] [address] [place], [place], [address] Phone: [phone]   Fax:  [phone]  Name: Vanessa Blair MRN: [id number] Date of Birth: [birth year]

## 2019-09-01 ENCOUNTER — Other Ambulatory Visit: Payer: Self-pay

## 2019-09-01 ENCOUNTER — Ambulatory Visit: Payer: Medicare Other | Admitting: Physical Therapy

## 2019-09-01 ENCOUNTER — Encounter: Payer: Self-pay | Admitting: Physical Therapy

## 2019-09-01 DIAGNOSIS — M6281 Muscle weakness (generalized): Secondary | ICD-10-CM

## 2019-09-01 DIAGNOSIS — M25562 Pain in left knee: Secondary | ICD-10-CM

## 2019-09-01 DIAGNOSIS — R600 Localized edema: Secondary | ICD-10-CM

## 2019-09-01 NOTE — Therapy (Signed)
[place], [place], [address] Phone: [phone]   Fax:  [phone]  Physical Therapy Treatment  Patient Details  Name: ARLYN BUMPUS MRN: [id number] Date of Birth: 05/26/1950 Referring Provider (PT): Theresa Duty   Encounter Date: 09/01/2019  PT End of Session - 09/01/19 1446    Visit Number  4    Date for PT Re-Evaluation  10/16/19    PT Start Time  1410    PT Stop Time  1455    PT Time Calculation (min)  45 min    Activity Tolerance  Patient tolerated treatment well    Behavior During Therapy  [facility] for tasks assessed/performed       Past Medical History:  Diagnosis Date  . Anemia   . Arthritis   . Hypercholesteremia   . Hypertension     Past Surgical History:  Procedure Laterality Date  . BREAST SURGERY    . TOTAL HIP ARTHROPLASTY    . 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 TUBAL LIGATION      There were no vitals filed for this visit.  Subjective Assessment - 09/01/19 1412    Subjective  Pt reports she is doing good    Currently in Pain?  Yes    Pain Score  4     Pain Location  Knee    Pain Orientation  Left;Lateral                        [facility] Adult PT Treatment/Exercise - 09/01/19 0001      Knee/Hip Exercises: Aerobic   Nustep  L3 x 7 min      Knee/Hip Exercises: Machines for Strengthening   Cybex Knee Extension  5# 2x10    Cybex Knee Flexion  15# 2x10    Cybex Leg Press  20lb 2x10     Other Machine  no weight leg press to 3 with 5 sec hold x5      Knee/Hip Exercises: Seated   Long Arc Quad  Left;2 sets;10 reps;Weights    Long Arc Quad Weight  3 lbs.      Vasopneumatic   Number Minutes Vasopneumatic   15 minutes    Vasopnuematic Location   Knee    Vasopneumatic Pressure  Medium    Vasopneumatic Temperature    36      Manual Therapy   Manual Therapy  Passive ROM    Manual therapy comments  Some PROM taken to end range and held    Passive ROM  L knee flexion               PT Short Term Goals - 08/24/19 1533      PT SHORT TERM GOAL #1   Title  Pt will be independent with HEP    Status  Achieved        PT Long Term Goals - 08/24/19 1533      PT LONG TERM GOAL #1   Title  Pt will demonstrate L knee flexion AROM to 110 deg    Status  On-going      PT LONG TERM GOAL #2   Title  Pt will demonstrate full knee extension    Status  Partially Met      PT LONG TERM GOAL #3  Title  Pt will demonstrate ability to ambulate 1 lap around building without AD    Status  On-going      PT LONG TERM GOAL #4   Title  Pt will demonstrate TUG without AD <13 sec    Status  On-going            Plan - 09/01/19 1447    Clinical Impression Statement  Pt 10 min late for today's session. Pt reports she has been doing well around the house; would like to see if she is strong/stable enough to vacuum. Pt tolerated progression of rx with no complaints of increased knee pain. Continue to progress ROM/strength.    PT Treatment/Interventions  ADLs/Self Care Home Management;Cryotherapy;Electrical Stimulation;Gait training;Stair training;Functional mobility training;Therapeutic activities;Therapeutic exercise;Balance training;Neuromuscular re-education;Manual techniques;Patient/family education;Scar mobilization;Passive range of motion    PT Next Visit Plan  Progress LE strengthening/flexibility ex's, manual/modalities as indicated    Consulted and Agree with Plan of Care  Patient       Patient will benefit from skilled therapeutic intervention in order to improve the following deficits and impairments:  Abnormal gait, Decreased range of motion, Difficulty walking, Decreased endurance, Decreased activity tolerance, Pain, Decreased balance, Hypomobility, Impaired flexibility, Decreased mobility,  Decreased strength, Increased edema  Visit Diagnosis: Muscle weakness (generalized)  Localized edema  Acute pain of left knee     Problem List Patient Active Problem List   Diagnosis Date Noted  . OA (osteoarthritis) of knee 07/18/2019  . Primary osteoarthritis of left knee 07/18/2019   Amador Cunas, PT, DPT Donald Prose Shalayah Beagley 09/01/2019, 2:49 PM  [place] [address] [place], [place], [address] Phone: [phone]   Fax:  [phone]  Name: ANAY WALTER MRN: [id number] Date of Birth: 1950/07/24

## 2019-09-06 ENCOUNTER — Encounter: Payer: Self-pay | Admitting: Physical Therapy

## 2019-09-06 ENCOUNTER — Ambulatory Visit: Payer: Medicare Other | Attending: Student | Admitting: Physical Therapy

## 2019-09-06 ENCOUNTER — Other Ambulatory Visit: Payer: Self-pay

## 2019-09-06 DIAGNOSIS — M6281 Muscle weakness (generalized): Secondary | ICD-10-CM | POA: Diagnosis not present

## 2019-09-06 DIAGNOSIS — R600 Localized edema: Secondary | ICD-10-CM

## 2019-09-06 DIAGNOSIS — M25562 Pain in left knee: Secondary | ICD-10-CM | POA: Diagnosis present

## 2019-09-06 NOTE — Therapy (Signed)
[place], [place], [address] Phone: [phone]   Fax:  [phone]  Physical Therapy Treatment  Patient Details  Name: NEILA TEEM MRN: [id number] Date of Birth: 04-24-1950 Referring Provider (PT): Theresa Duty   Encounter Date: 09/06/2019  PT End of Session - 09/06/19 1127    Visit Number  5    Date for PT Re-Evaluation  10/16/19    PT Start Time  1105    PT Stop Time  1150    PT Time Calculation (min)  45 min    Activity Tolerance  Patient tolerated treatment well    Behavior During Therapy  [facility] for tasks assessed/performed       Past Medical History:  Diagnosis Date  . Anemia   . Arthritis   . Hypercholesteremia   . Hypertension     Past Surgical History:  Procedure Laterality Date  . BREAST SURGERY    . TOTAL HIP ARTHROPLASTY    . 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 TUBAL LIGATION      There were no vitals filed for this visit.  Subjective Assessment - 09/06/19 1105    Subjective  Pt reports her stomach is a little upset this morning but that her knee feels better.    Currently in Pain?  Yes    Pain Score  3     Pain Location  Knee    Pain Orientation  Left;Lateral                        [facility] Adult PT Treatment/Exercise - 09/06/19 0001      Knee/Hip Exercises: Aerobic   Nustep  L4 x 6 min      Knee/Hip Exercises: Machines for Strengthening   Cybex Knee Extension  5# 2x10    Cybex Knee Flexion  20# 2x10    Cybex Leg Press  20lb 2x10     Other Machine  no weight leg press to 3 with 5 sec hold x5      Knee/Hip Exercises: Standing   Other Standing Knee Exercises  hip abduction/extension 3# 2x10 standing at counter      Knee/Hip Exercises: Seated   Long Arc Quad  Left;2 sets;10 reps;Weights    Long Arc Quad  Weight  3 lbs.    Marching  Left;2 sets;10 reps    Marching Weights  3 lbs.      Vasopneumatic   Number Minutes Vasopneumatic   15 minutes    Vasopnuematic Location   Knee    Vasopneumatic Pressure  Medium    Vasopneumatic Temperature   36      Manual Therapy   Manual Therapy  Passive ROM    Manual therapy comments  Some PROM taken to end range and held    Passive ROM  L knee flexion               PT Short Term Goals - 08/24/19 1533      PT SHORT TERM GOAL #1   Title  Pt will be independent with HEP    Status  Achieved        PT Long Term Goals - 08/24/19 1533      PT LONG TERM GOAL #1   Title  Pt will demonstrate L knee flexion AROM  to 110 deg    Status  On-going      PT LONG TERM GOAL #2   Title  Pt will demonstrate full knee extension    Status  Partially Met      PT LONG TERM GOAL #3   Title  Pt will demonstrate ability to ambulate 1 lap around building without AD    Status  On-going      PT LONG TERM GOAL #4   Title  Pt will demonstrate TUG without AD <13 sec    Status  On-going            Plan - 09/06/19 1130    Clinical Impression Statement  Pt is progressing in functional strength and ROM. Required cuing for form during standing hip/knee ex's. No complaints of increased knee pain with exercise. Continue to progress next rx.    PT Treatment/Interventions  ADLs/Self Care Home Management;Cryotherapy;Electrical Stimulation;Gait training;Stair training;Functional mobility training;Therapeutic activities;Therapeutic exercise;Balance training;Neuromuscular re-education;Manual techniques;Patient/family education;Scar mobilization;Passive range of motion    PT Next Visit Plan  Progress LE strengthening/flexibility ex's, manual/modalities as indicated    Consulted and Agree with Plan of Care  Patient       Patient will benefit from skilled therapeutic intervention in order to improve the following deficits and impairments:  Abnormal gait, Decreased  range of motion, Difficulty walking, Decreased endurance, Decreased activity tolerance, Pain, Decreased balance, Hypomobility, Impaired flexibility, Decreased mobility, Decreased strength, Increased edema  Visit Diagnosis: Muscle weakness (generalized)  Localized edema  Acute pain of left knee     Problem List Patient Active Problem List   Diagnosis Date Noted  . OA (osteoarthritis) of knee 07/18/2019  . Primary osteoarthritis of left knee 07/18/2019   Amador Cunas, PT, DPT Donald Prose Sabrina Arriaga 09/06/2019, 11:42 AM  [place] [address] [place], [place], [address] Phone: [phone]   Fax:  [phone]  Name: ONDREA DOW MRN: [id number] Date of Birth: [birth year]

## 2019-09-08 ENCOUNTER — Ambulatory Visit: Payer: Medicare Other | Admitting: Physical Therapy

## 2019-09-08 ENCOUNTER — Encounter: Payer: Self-pay | Admitting: Physical Therapy

## 2019-09-08 ENCOUNTER — Other Ambulatory Visit: Payer: Self-pay

## 2019-09-08 DIAGNOSIS — M25562 Pain in left knee: Secondary | ICD-10-CM

## 2019-09-08 DIAGNOSIS — R600 Localized edema: Secondary | ICD-10-CM

## 2019-09-08 DIAGNOSIS — M6281 Muscle weakness (generalized): Secondary | ICD-10-CM | POA: Diagnosis not present

## 2019-09-08 NOTE — Therapy (Signed)
[facility] [address] [place], [place], [address] Phone: [phone]   Fax:  [phone]  Physical Therapy Treatment  Patient Details  Name: Vanessa Blair MRN: [id number] Date of Birth: 02-Mar-1951 Referring Provider (PT): Arther Abbott   Encounter Date: 09/08/2019  PT End of Session - 09/08/19 1140    Visit Number  6    Date for PT Re-Evaluation  10/16/19    PT Start Time  1100    PT Stop Time  1150    PT Time Calculation (min)  50 min    Activity Tolerance  Patient tolerated treatment well    Behavior During Therapy  [facility] for tasks assessed/performed       Past Medical History:  Diagnosis Date  . Anemia   . Arthritis   . Hypercholesteremia   . Hypertension     Past Surgical History:  Procedure Laterality Date  . BREAST SURGERY    . TOTAL HIP ARTHROPLASTY    . 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 TUBAL LIGATION      There were no vitals filed for this visit.  Subjective Assessment - 09/08/19 1105    Subjective  Doing ok    Currently in Pain?  No/denies         [facility] PT Assessment - 09/08/19 0001      AROM   Left Knee Extension  0    Left Knee Flexion  107      Timed Up and Go Test   TUG  Normal TUG    Normal TUG (seconds)  10.66                    [facility] Adult PT Treatment/Exercise - 09/08/19 0001      Ambulation/Gait   Stairs  Yes    Stairs Assistance  6: Modified independent (Device/Increase time)    Stair Management Technique  One rail Right;Alternating pattern    Number of Stairs  24    Height of Stairs  6      Knee/Hip Exercises: Aerobic   Recumbent Bike  L0 x 3 min  seat 9    Nustep  L4 x 6 min      Knee/Hip Exercises: Machines for Strengthening   Cybex Knee Extension  10# 2x10, LLE 5lb 2x10     Cybex Knee Flexion  25# 2x10,  LLE 15lb 2x10     Cybex Leg Press  30lb 2x10       Vasopneumatic   Number Minutes Vasopneumatic   10 minutes    Vasopnuematic Location   Knee    Vasopneumatic Pressure  Medium    Vasopneumatic Temperature   36               PT Short Term Goals - 08/24/19 1533      PT SHORT TERM GOAL #1   Title  Pt will be independent with HEP    Status  Achieved        PT Long Term Goals - 09/08/19 1121      PT LONG TERM GOAL #4   Title  Pt will demonstrate TUG without AD <13 sec    Status  Achieved            Plan - 09/08/19 1141    Clinical Impression Statement  Pt has  progressed increasing her L knee AROM. She has also progressed with stair negotiation and decreasing her TUG time meeting goals. She tolerated more LLE isolation exercises without issue. No reports of pain today. She still has not tried to sit in her tub at home.    Personal Factors and Comorbidities  Age;Comorbidity 2    Comorbidities  arthritis, HTN    Examination-Activity Limitations  Bend;Carry;Lift;Stand;Stairs;Squat    Examination-Participation Restrictions  Meal Prep;Cleaning;Community Activity;Shop;Laundry    Stability/Clinical Decision Making  Stable/Uncomplicated    Rehab Potential  Good    PT Frequency  2x / week    PT Duration  8 weeks    PT Treatment/Interventions  ADLs/Self Care Home Management;Cryotherapy;Electrical Stimulation;Gait training;Stair training;Functional mobility training;Therapeutic activities;Therapeutic exercise;Balance training;Neuromuscular re-education;Manual techniques;Patient/family education;Scar mobilization;Passive range of motion    PT Next Visit Plan  Progress LE strengthening/flexibility ex's, manual/modalities as indicated       Patient will benefit from skilled therapeutic intervention in order to improve the following deficits and impairments:  Abnormal gait, Decreased range of motion, Difficulty walking, Decreased endurance, Decreased activity tolerance, Pain,  Decreased balance, Hypomobility, Impaired flexibility, Decreased mobility, Decreased strength, Increased edema  Visit Diagnosis: Localized edema  Acute pain of left knee  Muscle weakness (generalized)     Problem List Patient Active Problem List   Diagnosis Date Noted  . OA (osteoarthritis) of knee 07/18/2019  . Primary osteoarthritis of left knee 07/18/2019    Scot Jun, PTA 09/08/2019, 11:43 AM  [place] [address] [place], [place], [address] Phone: [phone]   Fax:  [phone]  Name: Vanessa Blair MRN: [id number] Date of Birth: 07/16/1950

## 2019-09-16 ENCOUNTER — Ambulatory Visit: Payer: Medicare Other | Admitting: Physical Therapy

## 2019-09-16 ENCOUNTER — Other Ambulatory Visit: Payer: Self-pay

## 2019-09-16 DIAGNOSIS — M6281 Muscle weakness (generalized): Secondary | ICD-10-CM | POA: Diagnosis not present

## 2019-09-16 DIAGNOSIS — R600 Localized edema: Secondary | ICD-10-CM

## 2019-09-16 DIAGNOSIS — M25562 Pain in left knee: Secondary | ICD-10-CM

## 2019-09-16 NOTE — Therapy (Signed)
[facility] [place] [address] [place], [place], [address] Phone: [phone]   Fax:  [phone]  Physical Therapy Treatment  Patient Details  Name: Vanessa Blair MRN: [id number] Date of Birth: 1950-10-09 Referring Provider (PT): Arther Abbott   Encounter Date: 09/16/2019   PT End of Session - 09/16/19 1053    Visit Number 7    Date for PT Re-Evaluation 10/16/19    PT Start Time 1020    PT Stop Time 1115    PT Time Calculation (min) 55 min           Past Medical History:  Diagnosis Date  . Anemia   . Arthritis   . Hypercholesteremia   . Hypertension     Past Surgical History:  Procedure Laterality Date  . BREAST SURGERY    . TOTAL HIP ARTHROPLASTY    . 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 TUBAL LIGATION      There were no vitals filed for this visit.   Subjective Assessment - 09/16/19 1020    Subjective alittle stiff this morning    Currently in Pain? Yes    Pain Score 3     Pain Location Knee    Pain Orientation Right;Posterior                             [facility] Adult PT Treatment/Exercise - 09/16/19 0001      Knee/Hip Exercises: Aerobic   Recumbent Bike 5 min    Nustep L4 x 6 min      Knee/Hip Exercises: Machines for Strengthening   Cybex Knee Extension 10# 2x10, LLE 5lb 2x10     Cybex Knee Flexion 25# 2x10, LLE 15lb 2x10     Cybex Leg Press 40# 2 sets 10   calf raises 40# 2 sets 10     Vasopneumatic   Number Minutes Vasopneumatic  15 minutes    Vasopnuematic Location  Knee    Vasopneumatic Pressure Medium    Vasopneumatic Temperature  36      Manual Therapy   Manual Therapy Passive ROM;Joint mobilization    Manual therapy comments CFM to scar adn instructed to do at home with Vit E or Coco butter    Joint Mobilization jt capsule  stretches    Passive ROM L knee flexion                    PT Short Term Goals - 08/24/19 1533      PT SHORT TERM GOAL #1   Title Pt will be independent with HEP    Status Achieved             PT Long Term Goals - 09/08/19 1121      PT LONG TERM GOAL #4   Title Pt will demonstrate TUG without AD <13 sec    Status Achieved                 Plan - 09/16/19 1054    Clinical Impression Statement pt is doing very well with all ex interventions, good ROM and strength progression. MT with jt casule stretch and instrcuted in CFM. progressing with goals    PT Treatment/Interventions ADLs/Self Care Home Management;Cryotherapy;Electrical Stimulation;Gait training;Stair training;Functional mobility training;Therapeutic activities;Therapeutic exercise;Balance training;Neuromuscular re-education;Manual techniques;Patient/family education;Scar mobilization;Passive  range of motion    PT Next Visit Plan Functinal strengthening           Patient will benefit from skilled therapeutic intervention in order to improve the following deficits and impairments:  Abnormal gait, Decreased range of motion, Difficulty walking, Decreased endurance, Decreased activity tolerance, Pain, Decreased balance, Hypomobility, Impaired flexibility, Decreased mobility, Decreased strength, Increased edema  Visit Diagnosis: Acute pain of left knee  Localized edema  Muscle weakness (generalized)     Problem List Patient Active Problem List   Diagnosis Date Noted  . OA (osteoarthritis) of knee 07/18/2019  . Primary osteoarthritis of left knee 07/18/2019    Esta Carmon,ANGIE PTA 09/16/2019, 10:55 AM  [place] [address] [place], [place], [address] Phone: [phone]   Fax:  [phone]  Name: Vanessa Blair MRN: [id number] Date of Birth: 09/20/1950

## 2019-09-20 ENCOUNTER — Encounter: Payer: Self-pay | Admitting: Physical Therapy

## 2019-09-20 ENCOUNTER — Ambulatory Visit: Payer: Medicare Other | Admitting: Physical Therapy

## 2019-09-20 ENCOUNTER — Other Ambulatory Visit: Payer: Self-pay

## 2019-09-20 DIAGNOSIS — M25562 Pain in left knee: Secondary | ICD-10-CM

## 2019-09-20 DIAGNOSIS — M6281 Muscle weakness (generalized): Secondary | ICD-10-CM

## 2019-09-20 DIAGNOSIS — R600 Localized edema: Secondary | ICD-10-CM

## 2019-09-20 NOTE — Therapy (Signed)
[facility] [place] [address] [place], [place], [address] Phone: [phone]   Fax:  [phone]  Physical Therapy Treatment  Patient Details  Name: Vanessa Blair MRN: [id number] Date of Birth: Sep 05, 1950 Referring Provider (PT): Arther Abbott   Encounter Date: 09/20/2019   PT End of Session - 09/20/19 1431    Visit Number 8    Date for PT Re-Evaluation 10/16/19    PT Start Time 1347    PT Stop Time 1440    PT Time Calculation (min) 53 min    Activity Tolerance Patient tolerated treatment well    Behavior During Therapy [facility] for tasks assessed/performed           Past Medical History:  Diagnosis Date  . Anemia   . Arthritis   . Hypercholesteremia   . Hypertension     Past Surgical History:  Procedure Laterality Date  . BREAST SURGERY    . TOTAL HIP ARTHROPLASTY    . 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 TUBAL LIGATION      There were no vitals filed for this visit.   Subjective Assessment - 09/20/19 1352    Subjective A little pain in the back of the knee. Had a bakers cyst prior    Currently in Pain? No/denies                             [facility] Adult PT Treatment/Exercise - 09/20/19 0001      Knee/Hip Exercises: Aerobic   Recumbent Bike L1 x 4 min     Nustep L4 LE only x 3 min       Knee/Hip Exercises: Machines for Strengthening   Cybex Knee Extension 10# 2x10, LLE 5lb 2x10     Cybex Knee Flexion 25# 2x10, LLE 15lb 2x10     Cybex Leg Press 40# 2 sets 10; Heel raises 40lb 2x10       Knee/Hip Exercises: Standing   Forward Step Up Right;2 sets;10 reps;Hand Hold: 2;Step Height: 6"      Vasopneumatic   Number Minutes Vasopneumatic  15 minutes    Vasopnuematic Location  Knee    Vasopneumatic Pressure Medium    Vasopneumatic Temperature  36                     PT Short Term Goals - 08/24/19 1533      PT SHORT TERM GOAL #1   Title Pt will be independent with HEP    Status Achieved             PT Long Term Goals - 09/08/19 1121      PT LONG TERM GOAL #4   Title Pt will demonstrate TUG without AD <13 sec    Status Achieved                 Plan - 09/20/19 1431    Clinical Impression Statement Pt continues to do well overall with function and ROM. Some LLE weakness noted with step ups, pt unable to reach full TKE on LLE. LLE did fatigue with SL extension. She has not tried to get in the bath tub yet.    Personal Factors and Comorbidities Age;Comorbidity 2    Comorbidities arthritis, HTN    Examination-Activity Limitations Bend;Carry;Lift;Stand;Stairs;Squat  Examination-Participation Restrictions Meal Prep;Cleaning;Community Activity;Shop;Laundry    Stability/Clinical Decision Making Stable/Uncomplicated    Rehab Potential Good    PT Frequency 2x / week    PT Duration 8 weeks    PT Treatment/Interventions ADLs/Self Care Home Management;Cryotherapy;Electrical Stimulation;Gait training;Stair training;Functional mobility training;Therapeutic activities;Therapeutic exercise;Balance training;Neuromuscular re-education;Manual techniques;Patient/family education;Scar mobilization;Passive range of motion    PT Next Visit Plan Functinal strengthening           Patient will benefit from skilled therapeutic intervention in order to improve the following deficits and impairments:  Abnormal gait, Decreased range of motion, Difficulty walking, Decreased endurance, Decreased activity tolerance, Pain, Decreased balance, Hypomobility, Impaired flexibility, Decreased mobility, Decreased strength, Increased edema  Visit Diagnosis: Muscle weakness (generalized)  Localized edema  Acute pain of left knee     Problem List Patient Active Problem List   Diagnosis Date Noted  . OA (osteoarthritis) of knee 07/18/2019   . Primary osteoarthritis of left knee 07/18/2019    Scot Jun, PTA 09/20/2019, 2:33 PM  [place] [address] [place], [place], [address] Phone: [phone]   Fax:  [phone]  Name: Vanessa Blair MRN: [id number] Date of Birth: 1950/11/21

## 2019-09-22 ENCOUNTER — Other Ambulatory Visit: Payer: Self-pay

## 2019-09-22 ENCOUNTER — Encounter: Payer: Self-pay | Admitting: Physical Therapy

## 2019-09-22 ENCOUNTER — Ambulatory Visit: Payer: Medicare Other | Admitting: Physical Therapy

## 2019-09-22 DIAGNOSIS — R600 Localized edema: Secondary | ICD-10-CM

## 2019-09-22 DIAGNOSIS — M6281 Muscle weakness (generalized): Secondary | ICD-10-CM | POA: Diagnosis not present

## 2019-09-22 DIAGNOSIS — M25562 Pain in left knee: Secondary | ICD-10-CM

## 2019-09-22 NOTE — Therapy (Signed)
[place], [place], [address] Phone: [phone]   Fax:  [phone]  Physical Therapy Treatment  Patient Details  Name: Vanessa Blair MRN: [id number] Date of Birth: Mar 30, 1951 Referring Provider (PT): Theresa Duty   Encounter Date: 09/22/2019   PT End of Session - 09/22/19 1236    Visit Number 9    Date for PT Re-Evaluation 10/16/19    PT Start Time 1154    PT Stop Time 1245    PT Time Calculation (min) 51 min    Activity Tolerance Patient tolerated treatment well    Behavior During Therapy [facility] for tasks assessed/performed           Past Medical History:  Diagnosis Date  . Anemia   . Arthritis   . Hypercholesteremia   . Hypertension     Past Surgical History:  Procedure Laterality Date  . BREAST SURGERY    . TOTAL HIP ARTHROPLASTY    . 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 TUBAL LIGATION      There were no vitals filed for this visit.   Subjective Assessment - 09/22/19 1156    Subjective "Feeling ok"    Currently in Pain? No/denies              [facility] PT Assessment - 09/22/19 0001      AROM   Left Knee Extension 0    Left Knee Flexion 112                         [facility] Adult PT Treatment/Exercise - 09/22/19 0001      Ambulation/Gait   Stairs Yes    Stairs Assistance 6: Modified independent (Device/Increase time)    Stair Management Technique One rail Right;Alternating pattern    Number of Stairs 24    Height of Stairs 6    Gait Comments 2 laps around [place] up and down slopes onr eseated rest in between      Knee/Hip Exercises: Aerobic   Recumbent Bike L1 x 5 min       Knee/Hip Exercises: Machines for Strengthening   Cybex Leg Press 40# 2 sets 10      Vasopneumatic   Number Minutes Vasopneumatic  10  minutes    Vasopnuematic Location  Knee    Vasopneumatic Pressure Medium    Vasopneumatic Temperature  36                    PT Short Term Goals - 08/24/19 1533      PT SHORT TERM GOAL #1   Title Pt will be independent with HEP    Status Achieved             PT Long Term Goals - 09/22/19 1203      PT LONG TERM GOAL #1   Title Pt will demonstrate L knee flexion AROM to 110 deg    Status Achieved      PT LONG TERM GOAL #2   Title Pt will demonstrate full knee extension    Status Achieved      PT LONG TERM GOAL #4   Title Pt will demonstrate TUG without AD <13 sec    Status Achieved  Plan - 09/22/19 1238    Clinical Impression Statement Pt has progressed increasing her L knee AROM meeting ROM goal. Pt did have some LLE eccentric load weakness descending stairs. No issues with outdoor ambulation going up and down slopes. Pt did however have some increase fatigue with the functional activities.    Examination-Activity Limitations Bend;Carry;Lift;Stand;Stairs;Squat    Examination-Participation Restrictions Meal Prep;Cleaning;Community Activity;Shop;Laundry    Stability/Clinical Decision Making Stable/Uncomplicated    Rehab Potential Good    PT Frequency 2x / week    PT Treatment/Interventions ADLs/Self Care Home Management;Cryotherapy;Electrical Stimulation;Gait training;Stair training;Functional mobility training;Therapeutic activities;Therapeutic exercise;Balance training;Neuromuscular re-education;Manual techniques;Patient/family education;Scar mobilization;Passive range of motion    PT Next Visit Plan Functinal strengthening           Patient will benefit from skilled therapeutic intervention in order to improve the following deficits and impairments:  Abnormal gait, Decreased range of motion, Difficulty walking, Decreased endurance, Decreased activity tolerance, Pain, Decreased balance, Hypomobility, Impaired flexibility, Decreased  mobility, Decreased strength, Increased edema  Visit Diagnosis: Localized edema  Acute pain of left knee  Muscle weakness (generalized)     Problem List Patient Active Problem List   Diagnosis Date Noted  . OA (osteoarthritis) of knee 07/18/2019  . Primary osteoarthritis of left knee 07/18/2019    Grayce Sessions, PTA 09/22/2019, 12:46 PM  [facility] [place] [address] [place], [place], [address] Phone: [phone]   Fax:  [phone]  Name: Vanessa Blair MRN: [id number] Date of Birth: 1951/03/03

## 2019-10-17 ENCOUNTER — Encounter (HOSPITAL_BASED_OUTPATIENT_CLINIC_OR_DEPARTMENT_OTHER): Payer: Self-pay

## 2019-10-17 ENCOUNTER — Emergency Department (HOSPITAL_BASED_OUTPATIENT_CLINIC_OR_DEPARTMENT_OTHER)
Admission: EM | Admit: 2019-10-17 | Discharge: 2019-10-17 | Disposition: A | Discharge status: Home / Self Care | Payer: Medicare Other | Attending: Emergency Medicine | Admitting: Emergency Medicine

## 2019-10-17 ENCOUNTER — Emergency Department (HOSPITAL_COMMUNITY): Payer: Medicare Other

## 2019-10-17 ENCOUNTER — Other Ambulatory Visit: Payer: Self-pay

## 2019-10-17 ENCOUNTER — Emergency Department (HOSPITAL_BASED_OUTPATIENT_CLINIC_OR_DEPARTMENT_OTHER): Payer: Medicare Other

## 2019-10-17 DIAGNOSIS — Z96649 Presence of unspecified artificial hip joint: Secondary | ICD-10-CM | POA: Insufficient documentation

## 2019-10-17 DIAGNOSIS — Z96652 Presence of left artificial knee joint: Secondary | ICD-10-CM | POA: Insufficient documentation

## 2019-10-17 DIAGNOSIS — Z8673 Personal history of transient ischemic attack (TIA), and cerebral infarction without residual deficits: Secondary | ICD-10-CM | POA: Diagnosis not present

## 2019-10-17 DIAGNOSIS — J069 Acute upper respiratory infection, unspecified: Secondary | ICD-10-CM | POA: Insufficient documentation

## 2019-10-17 DIAGNOSIS — R42 Dizziness and giddiness: Secondary | ICD-10-CM | POA: Diagnosis not present

## 2019-10-17 DIAGNOSIS — Z79899 Other long term (current) drug therapy: Secondary | ICD-10-CM | POA: Insufficient documentation

## 2019-10-17 LAB — BASIC METABOLIC PANEL
Anion gap: 10 (ref 5–15)
BUN: 19 mg/dL (ref 8–23)
CO2: 26 mmol/L (ref 22–32)
Calcium: 9.1 mg/dL (ref 8.9–10.3)
Chloride: 105 mmol/L (ref 98–111)
Creatinine, Ser: 0.9 mg/dL (ref 0.44–1.00)
GFR calc Af Amer: >60 mL/min (ref >60)
GFR calc non Af Amer: >60 mL/min (ref >60)
Glucose, Bld: 110 mg/dL — ABNORMAL HIGH (ref 70–99)
Potassium: 3.8 mmol/L (ref 3.5–5.1)
Sodium: 141 mmol/L (ref 135–145)

## 2019-10-17 LAB — CBC WITH DIFFERENTIAL/PLATELET
Abs Immature Granulocytes: 0.01 10*3/uL (ref 0.00–0.07)
Basophils Absolute: 0.1 10*3/uL (ref 0.0–0.1)
Basophils Relative: 1 %
Eosinophils Absolute: 0.2 10*3/uL (ref 0.0–0.5)
Eosinophils Relative: 4 %
HCT: 34.8 % — ABNORMAL LOW (ref 36.0–46.0)
Hemoglobin: 11 g/dL — ABNORMAL LOW (ref 12.0–15.0)
Immature Granulocytes: 0 %
Lymphocytes Relative: 43 %
Lymphs Abs: 1.8 10*3/uL (ref 0.7–4.0)
MCH: 29.2 pg (ref 26.0–34.0)
MCHC: 31.6 g/dL (ref 30.0–36.0)
MCV: 92.3 fL (ref 80.0–100.0)
Monocytes Absolute: 0.4 10*3/uL (ref 0.1–1.0)
Monocytes Relative: 10 %
Neutro Abs: 1.8 10*3/uL (ref 1.7–7.7)
Neutrophils Relative %: 42 %
Platelets: 233 10*3/uL (ref 150–400)
RBC: 3.77 MIL/uL — ABNORMAL LOW (ref 3.87–5.11)
RDW: 13.2 % (ref 11.5–15.5)
WBC: 4.3 10*3/uL (ref 4.0–10.5)
nRBC: 0 % (ref 0.0–0.2)

## 2019-10-17 MED ORDER — SODIUM CHLORIDE 0.9 % IV BOLUS
1000.0000 mL | Freq: Once | INTRAVENOUS | Status: AC
  Administered 2019-10-17: 1000 mL via INTRAVENOUS

## 2019-10-17 MED ORDER — DEXAMETHASONE 6 MG PO TABS
10.0000 mg | ORAL_TABLET | Freq: Once | ORAL | Status: AC
  Administered 2019-10-17: 10 mg via ORAL
  Filled 2019-10-17: qty 1

## 2019-10-17 MED ORDER — MECLIZINE HCL 25 MG PO TABS
25.0000 mg | ORAL_TABLET | Freq: Three times a day (TID) | ORAL | 0 refills | Status: AC | PRN
Start: 2019-10-17 — End: ?

## 2019-10-17 MED ORDER — MECLIZINE HCL 25 MG PO TABS
25.0000 mg | ORAL_TABLET | Freq: Once | ORAL | Status: AC
  Administered 2019-10-17: 25 mg via ORAL
  Filled 2019-10-17: qty 1

## 2019-10-17 NOTE — ED Triage Notes (Signed)
Pt states awoke this morning feeling dizzy, off balance, with head congestion.  Denies headache, denies n/v.

## 2019-10-17 NOTE — ED Notes (Signed)
Upon walking to door for discharge pt continues to feel dizzy, Dr Adela Lank notified, stated possibility of sending to [facility] for MRI

## 2019-10-17 NOTE — ED Notes (Signed)
Patient transported to X-ray 

## 2019-10-17 NOTE — Discharge Instructions (Addendum)
Return for worsening dizziness, difficulty walking, one sided numbness weakness, difficulty with speech or swallowing.  Follow up with your family doc.   We also included a number for neurologist as the MRI did show a remote old stroke.  There is no evidence of new stroke today.  Please use the meclizine to help with dizziness and rest.  Please stay hydrated.

## 2019-10-17 NOTE — ED Notes (Signed)
ED Provider at bedside. 

## 2019-10-17 NOTE — ED Notes (Signed)
Pt on monitor 

## 2019-10-17 NOTE — ED Notes (Signed)
Pt for transfer to [facility] ER via private vehicle for MRI.  Accepted by Dr Charm Barges

## 2019-10-17 NOTE — ED Triage Notes (Signed)
Pt sent here from [facility] for an MRI to rule out a stroke

## 2019-10-17 NOTE — ED Notes (Signed)
Called MRI at [facility]

## 2019-10-17 NOTE — ED Provider Notes (Signed)
10:37 AM Patient arrived as transfer from [facility] for MRI to rule out a central cause of dizziness.  Plan of care will be to reassess after MRI.  If MRI is reassuring, dissipate discharge home with prescription for meclizine for likely vertigo.  If stroke is discovered, anticipate touching base with neurology.  2:24 PM MRI shows no recent stroke, intracranial hemorrhage, or masses.  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  We went over all the patient's work-up and she reports he is doing much better after the meclizine.  She will begin prescription for meclizine will follow up with PCP and a neurologist for this old stroke.  She agreed with plan of care and was discharged in good condition.    Clinical Impression: 1. Dizziness   2. Viral upper respiratory tract infection   3. 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 He/she/they expressed understanding and agree(s) with the plan. Discharge instructions discussed at great length. Strict return precautions discussed and pt &/or family have verbalized understanding of the instructions. No further questions at time of discharge.    New Prescriptions   MECLIZINE (ANTIVERT) 25 MG TABLET    Take 1 tablet (25 mg total) by mouth 3 (three) times daily as needed for dizziness.    Follow Up: Ardyth Gal, MD [address] [place] [address] [phone]  Call today discuss your visit, see when they want to see you in the office  [facility] [address]     [address] [place] [address] [phone]    [facility] EMERGENCY DEPARTMENT [address] [id number] mc [place] [address] [phone]        Victor Granados, Canary Brim,  MD 10/17/19 1428

## 2019-10-17 NOTE — ED Provider Notes (Addendum)
[facility] EMERGENCY DEPARTMENT Provider Note   CSN: [id number] Arrival date & time: 10/17/19  5400     History Chief Complaint  Patient presents with  . Dizziness  . Nasal Congestion    Vanessa Blair is a 69 y.o. female.  69 yo F with a chief complaints of feeling unsteady.  She woke up like this this morning.  Seems to be noticed mostly when she is walking also when she goes from lying to sitting.  She denies a rotational movements denies feeling like she may pass out.  She has been having significant congestion over the past week or so.  Has a history of trouble with her sinuses in the past.  She has a feeling of fullness in both of her ears.  She denies fever.  Has been coughing mildly.  Denies chest pain or shortness of breath denies headache or neck pain.  Denies trauma to the head or neck.  She has been eating and drinking normally.  No new medication changes.  Has chronic dark stools secondary to iron use.  Denies abdominal pain.  Denies change in her diet.  The history is provided by the patient.  Dizziness Quality:  Lightheadedness Severity:  Moderate Onset quality:  Gradual Duration:  2 hours Timing:  Constant Progression:  Worsening Chronicity:  New Context: head movement and standing up   Relieved by:  Nothing Worsened by:  Nothing Ineffective treatments:  None tried Associated symptoms: no chest pain, no headaches, no nausea, no palpitations, no shortness of breath and no vomiting        Past Medical History:  Diagnosis Date  . Anemia   . Arthritis   . Hypercholesteremia   . Hypertension     Patient Active Problem List   Diagnosis Date Noted  . OA (osteoarthritis) of knee 07/18/2019  . Primary osteoarthritis of left knee 07/18/2019    Past Surgical History:  Procedure Laterality Date  . BREAST SURGERY    . TOTAL HIP ARTHROPLASTY    . 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   Procedure: TOTAL KNEE ARTHROPLASTY;  Surgeon: Ollen Gross,  MD;  Location: [facility];  Service: Orthopedics;  Laterality: Left;  . TUBAL LIGATION       OB History   No obstetric history on file.     History reviewed. No pertinent family history.  Social History   Tobacco Use  . Smoking status: Never Smoker  . Smokeless tobacco: Never Used  Vaping Use  . Vaping Use: Never used  Substance Use Topics  . Alcohol use: Never  . Drug use: Never    Home Medications Prior to Admission medications   Medication Sig Start Date End Date Taking? Authorizing Provider  ALPRAZolam (XANAX) 0.25 MG tablet Take 0.25 mg by mouth at bedtime. 06/13/19  Yes [provider]  calcium carbonate (OS-CAL - DOSED IN MG OF ELEMENTAL CALCIUM) 1250 (500 Ca) MG tablet Take 1 tablet by mouth daily with breakfast.   Yes [provider]  cholecalciferol (VITAMIN D3) 25 MCG (1000 UNIT) tablet Take 1,000 Units by mouth daily.   Yes [provider]  esomeprazole (NEXIUM) 40 MG capsule Take 40 mg by mouth daily. 06/05/19  Yes [provider]  fluticasone (FLONASE) 50 MCG/ACT nasal spray Place 1 spray into both nostrils daily as needed for allergies. 12/01/16  Yes [provider]  lisinopril-hydrochlorothiazide (ZESTORETIC) 20-25 MG tablet Take 1 tablet by mouth daily. 06/05/19  Yes [provider]  simvastatin (ZOCOR)  20 MG tablet Take 20 mg by mouth at bedtime. 02/08/19  Yes [provider]  tobramycin (TOBREX) 0.3 % ophthalmic solution Place 1 drop into the left eye See admin instructions. Instill one drop into the left eye four times daily the day of and the day after eye injection 06/20/19  Yes [provider]  gabapentin (NEURONTIN) 300 MG capsule Take 1 capsule (300 mg total) by mouth 3 (three) times daily. Take a 300 mg capsule three times a day for two weeks following surgery.Then take a 300 mg capsule two times a day for two weeks. Then take a 300 mg capsule once a day for two weeks. Then discontinue the  Gabapentin. 07/20/19   Tommie Ard, PA-C  meclizine (ANTIVERT) 25 MG tablet Take 1 tablet (25 mg total) by mouth 3 (three) times daily as needed for dizziness. 10/17/19   Melene Plan, DO  methocarbamol (ROBAXIN) 500 MG tablet Take 1 tablet (500 mg total) by mouth every 6 (six) hours as needed for muscle spasms. 07/20/19   Tommie Ard, PA-C  metoprolol tartrate (LOPRESSOR) 50 MG tablet Take 50 mg by mouth 2 (two) times daily. 06/05/19   [provider]  Multiple Vitamin (MULTIVITAMIN WITH MINERALS) TABS tablet Take 1 tablet by mouth daily.    [provider]  ondansetron (ZOFRAN) 4 MG tablet Take 1 tablet (4 mg total) by mouth every 6 (six) hours as needed for nausea. 07/19/19   Edmisten, Kristie L, PA  oxyCODONE (OXY IR/ROXICODONE) 5 MG immediate release tablet Take 1-2 tablets (5-10 mg total) by mouth every 6 (six) hours as needed for severe pain. 07/20/19   Tommie Ard, PA-C  traMADol (ULTRAM) 50 MG tablet Take 1-2 tablets (50-100 mg total) by mouth every 6 (six) hours as needed for moderate pain. 07/20/19   Tommie Ard, PA-C  vitamin B-12 (CYANOCOBALAMIN) 1000 MCG tablet Take 1,000 mcg by mouth daily.    [provider]    Allergies    Vicodin [hydrocodone-acetaminophen]  Review of Systems   Review of Systems  Constitutional: Negative for chills and fever.  HENT: Positive for congestion, rhinorrhea and sinus pressure. Negative for sore throat and trouble swallowing.   Eyes: Negative for redness and visual disturbance.  Respiratory: Positive for cough. Negative for shortness of breath and wheezing.   Cardiovascular: Negative for chest pain and palpitations.  Gastrointestinal: Negative for nausea and vomiting.  Genitourinary: Negative for dysuria and urgency.  Musculoskeletal: Negative for arthralgias and myalgias.  Skin: Negative for pallor and wound.  Neurological: Positive for dizziness. Negative for headaches.    Physical Exam Updated Vital  Signs BP 139/84 (BP Location: Right Arm)   Pulse 67   Temp 98.1 F (36.7 C) (Oral)   Resp 18   Ht 5\' 9"  (1.753 m)   Wt 99.8 kg   SpO2 97%   BMI 32.49 kg/m   Physical Exam Vitals and nursing note reviewed.  Constitutional:      General: She is not in acute distress.    Appearance: She is well-developed. She is not diaphoretic.  HENT:     Head: Normocephalic and atraumatic.     Comments: Swollen turbinates, posterior nasal drip, no noted sinus ttp, tm normal bilaterally.   Eyes:     Pupils: Pupils are equal, round, and reactive to light.  Cardiovascular:     Rate and Rhythm: Normal rate and regular rhythm.     Heart sounds: No murmur heard.  No friction rub.  No gallop.   Pulmonary:     Effort: Pulmonary effort is normal.     Breath sounds: No wheezing or rales.  Abdominal:     General: There is no distension.     Palpations: Abdomen is soft.     Tenderness: There is no abdominal tenderness.  Musculoskeletal:        General: No tenderness.     Cervical back: Normal range of motion and neck supple.  Skin:    General: Skin is warm and dry.  Neurological:     Mental Status: She is alert and oriented to person, place, and time.     GCS: GCS eye subscore is 4. GCS verbal subscore is 5. GCS motor subscore is 6.     Cranial Nerves: Cranial nerves are intact.     Sensory: Sensation is intact.     Motor: Motor function is intact.     Coordination: Coordination is intact.     Gait: Gait is intact.     Comments: Ambulates without difficulty.  Right-sided fast going nystagmus.  Psychiatric:        Behavior: Behavior normal.     ED Results / Procedures / Treatments   Labs (all labs ordered are listed, but only abnormal results are displayed) Labs Reviewed  CBC WITH DIFFERENTIAL/PLATELET - Abnormal; Notable for the following components:      Result Value   RBC 3.77 (*)    Hemoglobin 11.0 (*)    HCT 34.8 (*)    All other components within normal limits  BASIC METABOLIC  PANEL - Abnormal; Notable for the following components:   Glucose, Bld 110 (*)    All other components within normal limits    EKG EKG Interpretation  Date/Time:  Monday October 17 2019 07:58:17 EDT Ventricular Rate:  61 PR Interval:    QRS Duration: 100 QT Interval:  413 QTC Calculation: 416 R Axis:   -6 Text Interpretation: Sinus rhythm Consider left atrial enlargement Low voltage, precordial leads Abnormal R-wave progression, early transition Left ventricular hypertrophy No significant change since last tracing Confirmed by Melene PlanFloyd, Timira Bieda [phone]([id number]) on 10/17/2019 8:30:10 AM   Radiology DG Chest 2 View  Result Date: 10/17/2019 CLINICAL DATA:  Cough and dizziness EXAM: CHEST - 2 VIEW COMPARISON:  Chest CT February 24, 2003 FINDINGS: Lungs are clear. Heart size and pulmonary vascularity are normal. No adenopathy. There is aortic atherosclerosis. There is midthoracic dextroscoliosis with thoracolumbar levoscoliosis. IMPRESSION: Lungs clear.  Cardiac silhouette normal. Aortic Atherosclerosis (ICD10-I70.0). Electronically Signed   By: Bretta BangWilliam  Woodruff III M.D.   On: 10/17/2019 08:01    Procedures Procedures (including critical care time)  Medications Ordered in ED Medications  sodium chloride 0.9 % bolus 1,000 mL (1,000 mLs Intravenous New Bag/Given 10/17/19 0807)  meclizine (ANTIVERT) tablet 25 mg (25 mg Oral Given 10/17/19 0804)  dexamethasone (DECADRON) tablet 10 mg (10 mg Oral Given 10/17/19 [id number])    ED Course  I have reviewed the triage vital signs and the nursing notes.  Pertinent labs & imaging results that were available during my care of the patient were reviewed by me and considered in my medical decision making (see chart for details).    MDM Rules/Calculators/A&P                          69 yo F with a chief complaints of feeling lightheaded.  This started this morning.  Feels mildly unsteady when she walks but able to  ambulate without difficulty.  She has benign neurologic  exam for me.  Will obtain a basic laboratory evaluation to assess for anemia or electrolyte abnormality.  Give a bolus of IV fluids for possible dehydration.  I think most likely the patient has lab arthritis that she has had some ongoing sinus congestion and now has feeling of unsteadiness today.  Work-up here unremarkable no significant drop in hemoglobin no significant electrolyte abnormality.  Given meclizine and IV fluids with no significant change per the patient.  Discussed risks and benefits of MRI to evaluate for posterior circulation stroke.  Patient at this time is declining.  Will follow up with her family doctor.  One-time dose of Decadron here to treat for possible labyrinthitis.  Meclizine for home.  Patient initially would like to go home but when she was walking to her car she again became symptomatic.  I discussed case with Dr. Charm Barges.  We will send [facility] for an MRI of the brain.  If this negative likely would do her treatment with meclizine and follow-up with her PCP in the office.   Medications given during this visit Medications  sodium chloride 0.9 % bolus 1,000 mL (1,000 mLs Intravenous New Bag/Given 10/17/19 0807)  meclizine (ANTIVERT) tablet 25 mg (25 mg Oral Given 10/17/19 0804)  dexamethasone (DECADRON) tablet 10 mg (10 mg Oral Given 10/17/19 6440)     The patient appears reasonably screen and/or stabilized for discharge and I doubt any other medical condition or other [facility] requiring further screening, evaluation, or treatment in the ED at this time prior to discharge.   Final Clinical Impression(s) / ED Diagnoses Final diagnoses:  Dizziness  Viral upper respiratory tract infection    Rx / DC Orders ED Discharge Orders         Ordered    meclizine (ANTIVERT) 25 MG tablet  3 times daily PRN     Discontinue  Reprint     10/17/19 0849           Melene Plan, DO 10/17/19 0853    Melene Plan, DO 10/17/19 [phone]

## 2020-08-05 ENCOUNTER — Other Ambulatory Visit: Payer: Self-pay

## 2020-08-05 ENCOUNTER — Encounter (HOSPITAL_BASED_OUTPATIENT_CLINIC_OR_DEPARTMENT_OTHER): Payer: Self-pay | Admitting: Emergency Medicine

## 2020-08-05 ENCOUNTER — Emergency Department (HOSPITAL_BASED_OUTPATIENT_CLINIC_OR_DEPARTMENT_OTHER)
Admission: EM | Admit: 2020-08-05 | Discharge: 2020-08-05 | Disposition: A | Discharge status: Home / Self Care | Payer: Medicare Other | Attending: Emergency Medicine | Admitting: Emergency Medicine

## 2020-08-05 DIAGNOSIS — F606 Avoidant personality disorder: Secondary | ICD-10-CM | POA: Insufficient documentation

## 2020-08-05 DIAGNOSIS — Z79899 Other long term (current) drug therapy: Secondary | ICD-10-CM | POA: Diagnosis not present

## 2020-08-05 DIAGNOSIS — R300 Dysuria: Secondary | ICD-10-CM | POA: Diagnosis present

## 2020-08-05 DIAGNOSIS — Z96652 Presence of left artificial knee joint: Secondary | ICD-10-CM | POA: Insufficient documentation

## 2020-08-05 DIAGNOSIS — N3090 Cystitis, unspecified without hematuria: Secondary | ICD-10-CM

## 2020-08-05 DIAGNOSIS — Z96642 Presence of left artificial hip joint: Secondary | ICD-10-CM | POA: Diagnosis not present

## 2020-08-05 DIAGNOSIS — I10 Essential (primary) hypertension: Secondary | ICD-10-CM | POA: Insufficient documentation

## 2020-08-05 LAB — URINALYSIS, MICROSCOPIC (REFLEX)

## 2020-08-05 LAB — URINALYSIS, ROUTINE W REFLEX MICROSCOPIC
Bilirubin Urine: NEGATIVE
Glucose, UA: NEGATIVE mg/dL
Hgb urine dipstick: NEGATIVE
Ketones, ur: NEGATIVE mg/dL
Nitrite: NEGATIVE
Protein, ur: NEGATIVE mg/dL
Specific Gravity, Urine: 1.015 (ref 1.005–1.030)
pH: 5.5 (ref 5.0–8.0)

## 2020-08-05 MED ORDER — CEPHALEXIN 250 MG PO CAPS
500.0000 mg | ORAL_CAPSULE | ORAL | Status: AC
  Administered 2020-08-05: 500 mg via ORAL
  Filled 2020-08-05: qty 2

## 2020-08-05 MED ORDER — CEPHALEXIN 500 MG PO CAPS: 500.0000 mg | ORAL_CAPSULE | Freq: Two times a day (BID) | ORAL | 0 refills | Status: DC

## 2020-08-05 NOTE — ED Triage Notes (Signed)
Pt states she was at a cookout today and has felt very cold, and "shaky inside". Does endorse some burning with urination x 3 days. VSS on arrival. Pt ambulatory to room.

## 2020-08-05 NOTE — ED Provider Notes (Signed)
[facility] EMERGENCY DEPARTMENT Provider Note   CSN: [id number] Arrival date & time: 08/05/20  0132     History Chief Complaint  Patient presents with  . Dysuria    Vanessa Blair is a 70 y.o. female.  The history is provided by the patient.  Dysuria Pain quality:  Burning Pain severity:  Mild Onset quality:  Gradual Duration:  3 days Timing:  Intermittent Progression:  Unchanged Chronicity:  New Relieved by:  Nothing Worsened by:  Nothing Ineffective treatments:  None tried Urinary symptoms: no discolored urine   Associated symptoms: no abdominal pain, no fever, no flank pain, no genital lesions, no nausea, no vaginal discharge and no vomiting   Risk factors: no hx of pyelonephritis and no renal disease   Patient reports feeling cold and shaky inside.  No f/c/r.  No cough, no congestion.  No fevers, no external shaking.  No n/v/d.  No SOB, no CP, no abdominal pain, no flank pain, no rashes on the skin.       Past Medical History:  Diagnosis Date  . Anemia   . Arthritis   . Hypercholesteremia   . Hypertension     Patient Active Problem List   Diagnosis Date Noted  . OA (osteoarthritis) of knee 07/18/2019  . Primary osteoarthritis of left knee 07/18/2019    Past Surgical History:  Procedure Laterality Date  . BREAST SURGERY    . TOTAL HIP ARTHROPLASTY    . 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 TUBAL LIGATION       OB History   No obstetric history on file.     History reviewed. No pertinent family history.  Social History   Tobacco Use  . Smoking status: Never Smoker  . Smokeless tobacco: Never Used  Vaping Use  . Vaping Use: Never used  Substance Use Topics  . Alcohol use: Never  . Drug use: Never    Home Medications Prior to Admission medications   Medication Sig Start Date End Date Taking?  Authorizing Provider  ALPRAZolam (XANAX) 0.25 MG tablet Take 0.25 mg by mouth at bedtime. 06/13/19   [provider]  calcium carbonate (OS-CAL - DOSED IN MG OF ELEMENTAL CALCIUM) 1250 (500 Ca) MG tablet Take 1 tablet by mouth daily with breakfast.    [provider]  cholecalciferol (VITAMIN D3) 25 MCG (1000 UNIT) tablet Take 1,000 Units by mouth daily.    [provider]  esomeprazole (NEXIUM) 40 MG capsule Take 40 mg by mouth daily. 06/05/19   [provider]  fluticasone (FLONASE) 50 MCG/ACT nasal spray Place 1 spray into both nostrils daily as needed for allergies. 12/01/16   [provider]  gabapentin (NEURONTIN) 300 MG capsule Take 1 capsule (300 mg total) by mouth 3 (three) times daily. Take a 300 mg capsule three times a day for two weeks following surgery.Then take a 300 mg capsule two times a day for two weeks. Then take a 300 mg capsule once a day for two weeks. Then discontinue the Gabapentin. 07/20/19   Tommie Ard, PA-C  lisinopril-hydrochlorothiazide (ZESTORETIC) 20-25 MG tablet Take 1 tablet by mouth daily. 06/05/19   [provider]  meclizine (ANTIVERT) 25 MG tablet Take 1 tablet (25 mg total) by mouth 3 (three) times daily as needed for dizziness. 10/17/19   Melene Plan, DO  methocarbamol (ROBAXIN) 500  MG tablet Take 1 tablet (500 mg total) by mouth every 6 (six) hours as needed for muscle spasms. 07/20/19   Tommie Ard, PA-C  metoprolol tartrate (LOPRESSOR) 50 MG tablet Take 50 mg by mouth 2 (two) times daily. 06/05/19   [provider]  Multiple Vitamin (MULTIVITAMIN WITH MINERALS) TABS tablet Take 1 tablet by mouth daily.    [provider]  ondansetron (ZOFRAN) 4 MG tablet Take 1 tablet (4 mg total) by mouth every 6 (six) hours as needed for nausea. 07/19/19   Edmisten, Kristie L, PA  oxyCODONE (OXY IR/ROXICODONE) 5 MG immediate release tablet Take 1-2 tablets (5-10 mg total) by mouth every 6 (six) hours as  needed for severe pain. 07/20/19   Tommie Ard, PA-C  simvastatin (ZOCOR) 20 MG tablet Take 20 mg by mouth at bedtime. 02/08/19   [provider]  tobramycin (TOBREX) 0.3 % ophthalmic solution Place 1 drop into the left eye See admin instructions. Instill one drop into the left eye four times daily the day of and the day after eye injection 06/20/19   [provider]  traMADol (ULTRAM) 50 MG tablet Take 1-2 tablets (50-100 mg total) by mouth every 6 (six) hours as needed for moderate pain. 07/20/19   Tommie Ard, PA-C  vitamin B-12 (CYANOCOBALAMIN) 1000 MCG tablet Take 1,000 mcg by mouth daily.    [provider]    Allergies    Vicodin [hydrocodone-acetaminophen]  Review of Systems   Review of Systems  Constitutional: Negative for fever.  HENT: Negative for congestion.   Eyes: Negative for visual disturbance.  Respiratory: Negative for shortness of breath.   Cardiovascular: Negative for chest pain.  Gastrointestinal: Negative for abdominal pain, nausea and vomiting.  Genitourinary: Positive for dysuria. Negative for flank pain and vaginal discharge.  Musculoskeletal: Negative for arthralgias.  Neurological: Negative for dizziness.  Psychiatric/Behavioral: Negative for agitation.  All other systems reviewed and are negative.   Physical Exam Updated Vital Signs BP 138/86 (BP Location: Right Arm)   Pulse 74   Temp 98.1 F (36.7 C) (Oral)   Resp 17   Ht 5\' 9"  (1.753 m)   Wt 108.9 kg   SpO2 100%   BMI 35.44 kg/m   Physical Exam Vitals and nursing note reviewed.  Constitutional:      General: She is not in acute distress.    Appearance: Normal appearance.  HENT:     Head: Normocephalic and atraumatic.     Nose: Nose normal.  Eyes:     Conjunctiva/sclera: Conjunctivae normal.     Pupils: Pupils are equal, round, and reactive to light.  Cardiovascular:     Rate and Rhythm: Normal rate and regular rhythm.     Pulses: Normal pulses.      Heart sounds: Normal heart sounds.  Pulmonary:     Effort: Pulmonary effort is normal.     Breath sounds: Normal breath sounds.  Abdominal:     General: Abdomen is flat. Bowel sounds are normal.     Palpations: Abdomen is soft.     Tenderness: There is no abdominal tenderness. There is no guarding.  Musculoskeletal:        General: Normal range of motion.     Cervical back: Normal range of motion and neck supple.  Skin:    General: Skin is warm and dry.     Capillary Refill: Capillary refill takes less than 2 seconds.  Neurological:     General: No focal deficit  present.     Mental Status: She is alert and oriented to person, place, and time.     Deep Tendon Reflexes: Reflexes normal.  Psychiatric:        Mood and Affect: Mood is anxious.     ED Results / Procedures / Treatments   Labs (all labs ordered are listed, but only abnormal results are displayed) Results for orders placed or performed during the hospital encounter of 08/05/20  Urinalysis, Routine w reflex microscopic Urine, Clean Catch  Result Value Ref Range   Color, Urine YELLOW YELLOW   APPearance CLEAR CLEAR   Specific Gravity, Urine 1.015 1.005 - 1.030   pH 5.5 5.0 - 8.0   Glucose, UA NEGATIVE NEGATIVE mg/dL   Hgb urine dipstick NEGATIVE NEGATIVE   Bilirubin Urine NEGATIVE NEGATIVE   Ketones, ur NEGATIVE NEGATIVE mg/dL   Protein, ur NEGATIVE NEGATIVE mg/dL   Nitrite NEGATIVE NEGATIVE   Leukocytes,Ua SMALL (A) NEGATIVE  Urinalysis, Microscopic (reflex)  Result Value Ref Range   RBC / HPF 0-5 0 - 5 RBC/hpf   WBC, UA 0-5 0 - 5 WBC/hpf   Bacteria, UA RARE (A) NONE SEEN   Squamous Epithelial / LPF 0-5 0 - 5   No results found.  Radiology No results found.  Procedures Procedures   Medications Ordered in ED Medications  cephALEXin (KEFLEX) capsule 500 mg (500 mg Oral Given 08/05/20 0228)    ED Course  I have reviewed the triage vital signs and the nursing notes.  Pertinent labs & imaging results  that were available during my care of the patient were reviewed by me and considered in my medical decision making (see chart for details).    Normal vitals and exam, except patient appears very anxious.  I suspect this is all anxiety. I will treat the urine.  No indication for labs or imaging at this time.    JODINE MUCHMORE was evaluated in Emergency Department on 08/05/2020 for the symptoms described in the history of present illness. She was evaluated in the context of the global COVID-19 pandemic, which necessitated consideration that the patient might be at risk for infection with the SARS-CoV-2 virus that causes COVID-19. Institutional protocols and algorithms that pertain to the evaluation of patients at risk for COVID-19 are in a state of rapid change based on information released by regulatory bodies including the CDC and federal and state organizations. These policies and algorithms were followed during the patient's care in the ED.  Final Clinical Impression(s) / ED Diagnoses Return for intractable cough, coughing up blood, fevers >100.4 unrelieved by medication, shortness of breath, intractable vomiting, chest pain, shortness of breath, weakness, numbness, changes in speech, facial asymmetry, abdominal pain, passing out, Inability to tolerate liquids or food, cough, altered mental status or any concerns. No signs of systemic illness or infection. The patient is nontoxic-appearing on exam and vital signs are within normal limits.  I have reviewed the triage vital signs and the nursing notes. Pertinent labs & imaging results that were available during my care of the patient were reviewed by me and considered in my medical decision making (see chart for details). After history, exam, and medical workup I feel the patient has been appropriately medically screened and is safe for discharge home. Pertinent diagnoses were discussed with the patient. Patient was given return precautions.   Solomia Harrell,  Ellyssa Zagal, MD 08/05/20 [phone]

## 2020-08-07 LAB — URINE CULTURE

## 2020-09-20 ENCOUNTER — Ambulatory Visit: Payer: Medicare Other | Attending: Orthopedic Surgery | Admitting: Physical Therapy

## 2020-09-20 ENCOUNTER — Other Ambulatory Visit: Payer: Self-pay

## 2020-09-20 ENCOUNTER — Encounter: Payer: Self-pay | Admitting: Physical Therapy

## 2020-09-20 DIAGNOSIS — R252 Cramp and spasm: Secondary | ICD-10-CM

## 2020-09-20 DIAGNOSIS — R293 Abnormal posture: Secondary | ICD-10-CM | POA: Diagnosis present

## 2020-09-20 DIAGNOSIS — M546 Pain in thoracic spine: Secondary | ICD-10-CM | POA: Insufficient documentation

## 2020-09-20 DIAGNOSIS — R600 Localized edema: Secondary | ICD-10-CM | POA: Insufficient documentation

## 2020-09-20 DIAGNOSIS — M25562 Pain in left knee: Secondary | ICD-10-CM | POA: Insufficient documentation

## 2020-09-20 DIAGNOSIS — M6281 Muscle weakness (generalized): Secondary | ICD-10-CM | POA: Insufficient documentation

## 2020-09-20 NOTE — Therapy (Signed)
[facility] [place] [facility] [address] [place], [place], [address] Phone: [phone]   Fax:  [phone]  Physical Therapy Evaluation  Patient Details  Name: Vanessa Blair MRN: [id number] Date of Birth: [birth year] Referring Provider (PT): Shon Baton   Encounter Date: 09/20/2020   PT End of Session - 09/20/20 1351     Visit Number 1    Date for PT Re-Evaluation 12/13/20    PT Start Time 1215    PT Stop Time 1252    PT Time Calculation (min) 37 min    Activity Tolerance Patient tolerated treatment well    Behavior During Therapy [facility] for tasks assessed/performed             Past Medical History:  Diagnosis Date   Anemia    Arthritis    Hypercholesteremia    Hypertension     Past Surgical History:  Procedure Laterality Date   BREAST SURGERY     TOTAL HIP ARTHROPLASTY     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    Subjective Assessment - 09/20/20 1220     Subjective Pt reports that she has been having thoracic pain with radiating pain into B UT L>R. Pt reports occasional tingling in L UE. Pt denies radiating pain into LUE. Pt states difficult to identify aggravating factors; pain occurs intermittently. Pt states she had xray of thoracic spine where they identified old T11 compression fracture and mild scoliosis. Has trouble with upright sitting.    Pertinent History R THA ~20 yrs ago, L TKA    Limitations Lifting;House hold activities    Diagnostic tests xrays    Patient Stated Goals get rid of the pain; feel better overall    Currently in Pain? No/denies    Pain Score 0-No pain   states can up to 8/10   Pain Location Thoracic    Pain Orientation Mid    Pain Descriptors / Indicators Dull;Aching    Pain Type Chronic pain    Pain Onset  More than a month ago    Pain Frequency Intermittent    Aggravating Factors  difficulty identifying aggravating factors    Pain Relieving Factors rest, voltarin gel, heat, baths                [facility] PT Assessment - 09/20/20 0001       Assessment   Medical Diagnosis thoracic pain    Referring Provider (PT) Brooks    Hand Dominance Right    Next MD Visit --   call back prn   Prior Therapy PT for L TKA last year      Precautions   Precautions None      Restrictions   Weight Bearing Restrictions No      Balance Screen   Has the patient fallen in the past 6 months No    Has the patient had a decrease in activity level because of a fear of falling?  No    Is the patient reluctant to leave their home because of a fear of falling?  No      Home Environment   Additional Comments 1 step to enter home; reports no trouble with stair      Prior Function   Level of Independence Independent  Vocation Retired    Leisure Careers information officer Tests   Functional tests Sit to Stand      Sit to Stand   Comments [facility]      Posture/Postural Control   Posture/Postural Control Postural limitations    Postural Limitations Rounded Shoulders;Forward head      ROM / Strength   AROM / PROM / Strength AROM;Strength      AROM   Overall AROM Comments shoulder AROM WFL, cervical rotation/lat flexion limited 25% otherwise [facility]      Strength   Overall Strength Comments BUE 4+/5; postural weakness      Palpation   Spinal mobility hypomobility cervical/thoracic spine, mild scoliosis noted (R shoulder slightly elevated above L with normal sitting posture), excessive FHP and increased thoracic kyphosis    Palpation comment tender to palpation B UT/periscapulars/cervical paraspinals                        Objective measurements completed on examination: See above findings.               PT Education - 09/20/20  1351     Education Details Pt educated on POC and HEP    Person(s) Educated Patient    Methods Explanation;Demonstration;Handout    Comprehension Verbalized understanding;Returned demonstration              PT Short Term Goals - 09/20/20 1359       PT SHORT TERM GOAL #1   Title Pt will be independent with HEP    Time 2    Period Weeks    Status New    Target Date 10/04/20               PT Long Term Goals - 09/20/20 1359       PT LONG TERM GOAL #1   Title Pt will report 50% reduction in thoracic pain    Time 6    Period Weeks    Status New    Target Date 11/01/20      PT LONG TERM GOAL #2   Title Pt will demo understanding of posture/body mechanics    Time 6    Period Weeks    Status New    Target Date 11/01/20      PT LONG TERM GOAL #3   Title Pt will report ability to complete all ADLs with no increase in thoracic pain    Time 6    Period Weeks    Status New    Target Date 11/01/20                    Plan - 09/20/20 1354     Clinical Impression Statement Pt presents to clinic with reports of chronic thoracic pain present for years and worsening intermittently, no known MOI. Pt xrays demo old T11 compression fx and pt reports mild scoliosis. With seated posture, pt demos signficant FHP, thoracic kyphosis, and R shoulder elevated slightly above L. Pt demos limitations in cervical rotation/lat flexion, weakness of postural muscles, and tightness/tenderness of B UT/thoracic parapinals/cervical paraspinals L>R. Pt would benefit from postural education, strengthening of postural mm, and scapular stabilization to reduce pain and return to PLOF.    Personal Factors and Comorbidities Age;Comorbidity 2    Comorbidities arthritis, HTN    Examination-Activity Limitations Bend;Carry;Lift    Examination-Participation Restrictions Meal Prep;Cleaning;Community  Activity;Shop;Laundry    Stability/Clinical Decision Making Stable/Uncomplicated    Clinical  Decision Making Low    Rehab Potential Good    PT Frequency 2x / week    PT Duration 6 weeks    PT Treatment/Interventions ADLs/Self Care Home Management;Cryotherapy;Electrical Stimulation;Therapeutic activities;Therapeutic exercise;Balance training;Neuromuscular re-education;Manual techniques;Patient/family education;Passive range of motion;Moist Heat;Iontophoresis 4mg /ml Dexamethasone;Dry needling    PT Next Visit Plan review/progress HEP, scap stab/postural strengthening, manual/modalities as indicated    Consulted and Agree with Plan of Care Patient             Patient will benefit from skilled therapeutic intervention in order to improve the following deficits and impairments:  Decreased range of motion, Decreased activity tolerance, Pain, Hypomobility, Impaired flexibility, Decreased strength, Increased muscle spasms, Impaired UE functional use, Postural dysfunction  Visit Diagnosis: Pain in thoracic spine  Cramp and spasm  Muscle weakness (generalized)  Abnormal posture     Problem List Patient Active Problem List   Diagnosis Date Noted   OA (osteoarthritis) of knee 07/18/2019   Primary osteoarthritis of left knee 07/18/2019   09/17/2019, PT, DPT Lysle Rubens Leilyn Frayre 09/20/2020, 2:02 PM  [facility] [place] [facility] [address] [place], [place], [place] Phone: [phone]   Fax:  [phone]  Name: PORTIA WISDOM MRN: [name] Date of Birth: June 28, 1950

## 2020-09-20 NOTE — Patient Instructions (Signed)
Access Code: [email or web address] URL: [email or web address] Date: 09/20/2020 Prepared by: Lysle Rubens  Exercises Standing Shoulder Row with Anchored Resistance - 1 x daily - 7 x weekly - 3 sets - 10 reps Shoulder Extension with Resistance - 1 x daily - 7 x weekly - 3 sets - 10 reps Shoulder External Rotation and Scapular Retraction with Resistance - 1 x daily - 7 x weekly - 3 sets - 10 reps Seated Upper Trapezius Stretch - 1 x daily - 7 x weekly - 3 sets - 2 reps - 15 sec hold Seated Levator Scapulae Stretch - 1 x daily - 7 x weekly - 3 sets - 2 reps - 15 sec hold

## 2020-09-26 ENCOUNTER — Other Ambulatory Visit: Payer: Self-pay

## 2020-09-26 ENCOUNTER — Ambulatory Visit: Payer: Medicare Other | Admitting: Physical Therapy

## 2020-09-26 ENCOUNTER — Encounter: Payer: Self-pay | Admitting: Physical Therapy

## 2020-09-26 DIAGNOSIS — R252 Cramp and spasm: Secondary | ICD-10-CM

## 2020-09-26 DIAGNOSIS — M546 Pain in thoracic spine: Secondary | ICD-10-CM

## 2020-09-26 DIAGNOSIS — R293 Abnormal posture: Secondary | ICD-10-CM

## 2020-09-26 DIAGNOSIS — M6281 Muscle weakness (generalized): Secondary | ICD-10-CM

## 2020-09-26 NOTE — Therapy (Signed)
[facility] [place] [facility] [address] [place], [place], [address] Phone: [phone]   Fax:  [phone]  Physical Therapy Treatment  Patient Details  Name: Vanessa Blair MRN: [id number] Date of Birth: [birth year] Referring Provider (PT): Shon Baton   Encounter Date: 09/26/2020   PT End of Session - 09/26/20 1511     Visit Number 2    Date for PT Re-Evaluation 12/13/20    PT Start Time 1432    PT Stop Time 1513    PT Time Calculation (min) 41 min    Activity Tolerance Patient tolerated treatment well    Behavior During Therapy [facility] for tasks assessed/performed             Past Medical History:  Diagnosis Date   Anemia    Arthritis    Hypercholesteremia    Hypertension     Past Surgical History:  Procedure Laterality Date   BREAST SURGERY     TOTAL HIP ARTHROPLASTY     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   Subjective Assessment - 09/26/20 1435     Subjective Doing ok, No having much pain.    Currently in Pain? No/denies                               [facility] Adult PT Treatment/Exercise - 09/26/20 0001       Exercises   Exercises Neck;Shoulder      Neck Exercises: Machines for Strengthening   UBE (Upper Arm Bike) L1 x 2.5 min each      Neck Exercises: Theraband   Rows 20 reps;Red      Neck Exercises: Seated   Neck Retraction 20 reps      Shoulder Exercises: Seated   External Rotation Both;20 reps    Theraband Level (Shoulder External Rotation) Level 2 (Red)      Shoulder Exercises: Standing   Flexion 20 reps;Weights;Both   2#   ABduction Both;10 reps;Weights   1#   Shoulder Elevation 10 reps;Standing;Both   2 sets with 4#     Manual Therapy   Manual Therapy Soft tissue mobilization    Manual  therapy comments --   L rhomboids                     PT Short Term Goals - 09/20/20 1359       PT SHORT TERM GOAL #1   Title Pt will be independent with HEP    Time 2    Period Weeks    Status New    Target Date 10/04/20               PT Long Term Goals - 09/20/20 1359       PT LONG TERM GOAL #1   Title Pt will report 50% reduction in thoracic pain    Time 6    Period Weeks    Status New    Target Date 11/01/20      PT LONG TERM GOAL #2   Title Pt will demo understanding of posture/body mechanics    Time 6    Period Weeks    Status New    Target Date 11/01/20  PT LONG TERM GOAL #3   Title Pt will report ability to complete all ADLs with no increase in thoracic pain    Time 6    Period Weeks    Status New    Target Date 11/01/20                   Plan - 09/26/20 1512     Clinical Impression Statement Pt tolerated an initial progression to TE well. Pt has forward head and rounded shoulders. Some UE fatigue noted with standing flexion and abduction. Postural cues required with seated rows. Some dense muscle tissues noted in rhomboid area on the L side.    Personal Factors and Comorbidities Age;Comorbidity 2    Examination-Activity Limitations Bend;Carry;Lift    Examination-Participation Restrictions Meal Prep;Cleaning;Community Activity;Shop;Laundry    Stability/Clinical Decision Making Stable/Uncomplicated    Rehab Potential Good    PT Frequency 2x / week    PT Duration 6 weeks    PT Treatment/Interventions ADLs/Self Care Home Management;Cryotherapy;Electrical Stimulation;Therapeutic activities;Therapeutic exercise;Balance training;Neuromuscular re-education;Manual techniques;Patient/family education;Passive range of motion;Moist Heat;Iontophoresis 4mg /ml Dexamethasone;Dry needling    PT Next Visit Plan scap stab/postural strengthening, manual/modalities as indicated             Patient will benefit from skilled therapeutic  intervention in order to improve the following deficits and impairments:  Decreased range of motion, Decreased activity tolerance, Pain, Hypomobility, Impaired flexibility, Decreased strength, Increased muscle spasms, Impaired UE functional use, Postural dysfunction  Visit Diagnosis: Pain in thoracic spine  Muscle weakness (generalized)  Abnormal posture  Cramp and spasm     Problem List Patient Active Problem List   Diagnosis Date Noted   OA (osteoarthritis) of knee 07/18/2019   Primary osteoarthritis of left knee 07/18/2019    09/17/2019 09/26/2020, 3:17 PM  [facility] [place] [facility] [address] [place], [place], [place] Phone: [phone]   Fax:  [phone]  Name: Vanessa Blair MRN: [name] Date of Birth: January 11, 1951

## 2020-10-01 ENCOUNTER — Ambulatory Visit: Payer: Medicare Other | Admitting: Physical Therapy

## 2020-10-01 ENCOUNTER — Other Ambulatory Visit: Payer: Self-pay

## 2020-10-01 ENCOUNTER — Ambulatory Visit: Payer: Medicare Other | Admitting: Rehabilitative and Restorative Service Providers"

## 2020-10-02 ENCOUNTER — Ambulatory Visit: Payer: Medicare Other | Admitting: Physical Therapy

## 2020-10-02 ENCOUNTER — Encounter: Payer: Self-pay | Admitting: Physical Therapy

## 2020-10-02 DIAGNOSIS — M6281 Muscle weakness (generalized): Secondary | ICD-10-CM

## 2020-10-02 DIAGNOSIS — M546 Pain in thoracic spine: Secondary | ICD-10-CM

## 2020-10-02 DIAGNOSIS — R252 Cramp and spasm: Secondary | ICD-10-CM

## 2020-10-02 DIAGNOSIS — R293 Abnormal posture: Secondary | ICD-10-CM

## 2020-10-02 NOTE — Patient Instructions (Signed)

## 2020-10-02 NOTE — Therapy (Signed)
[facility] [place] [facility] [address] [place], [place], [address] Phone: [phone]   Fax:  [phone]  Physical Therapy Treatment  Patient Details  Name: Vanessa Blair MRN: [id number] Date of Birth: [birth year] Referring Provider (PT): Shon Baton   Encounter Date: 10/02/2020   PT End of Session - 10/02/20 1131     Visit Number 3    Date for PT Re-Evaluation 12/13/20    PT Start Time 1047    PT Stop Time 1126    PT Time Calculation (min) 39 min    Activity Tolerance Patient tolerated treatment well    Behavior During Therapy [facility] for tasks assessed/performed             Past Medical History:  Diagnosis Date   Anemia    Arthritis    Hypercholesteremia    Hypertension     Past Surgical History:  Procedure Laterality Date   BREAST SURGERY     TOTAL HIP ARTHROPLASTY     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   Subjective Assessment - 10/02/20 1053     Subjective Pt reports doing well with no pain today.    Currently in Pain? No/denies    Pain Score 0-No pain                               [facility] Adult PT Treatment/Exercise - 10/02/20 0001       Neck Exercises: Machines for Strengthening   UBE (Upper Arm Bike) L2 x 3 min each    Cybex Row 20# 2x10    Cybex Chest Press 5# 2x10    Lat Pull 20# 2x10    Other Machines for Strengthening standing shoulder extension 5# 2x10      Neck Exercises: Standing   Other Standing Exercises 1# flex/ext/IR x10   cues to avoid compensation/shoulder elevation with IR   Other Standing Exercises standing flexion with ball   2x10 against wall with cues to keep shoulders and head against wall     Neck Exercises: Stretches   Other Neck Stretches standing open book thoracic rotation  at wall x5 B                      PT Short Term Goals - 10/02/20 1133       PT SHORT TERM GOAL #1   Title Pt will be independent with HEP    Time 2    Period Weeks    Status Achieved    Target Date 10/04/20               PT Long Term Goals - 09/20/20 1359       PT LONG TERM GOAL #1   Title Pt will report 50% reduction in thoracic pain    Time 6    Period Weeks    Status New    Target Date 11/01/20      PT LONG TERM GOAL #2   Title Pt will demo understanding of posture/body mechanics    Time 6    Period Weeks    Status New    Target Date 11/01/20      PT LONG TERM GOAL #3  Title Pt will report ability to complete all ADLs with no increase in thoracic pain    Time 6    Period Weeks    Status New    Target Date 11/01/20                   Plan - 10/02/20 1131     Clinical Impression Statement Pt tolerated progression of TE well with no c/o increased pain. Progressed to machine interventions; cues for posture/form. Cues to avoid shoulder elevation and cuing to avoid excess thoracic kyphosis with most standing ex's. Did note increased fatigue by end of session. Discussed DN and provided information handout; follow up on pt interest next rx. Continue to progress to tolerance.    PT Treatment/Interventions ADLs/Self Care Home Management;Cryotherapy;Electrical Stimulation;Therapeutic activities;Therapeutic exercise;Balance training;Neuromuscular re-education;Manual techniques;Patient/family education;Passive range of motion;Moist Heat;Iontophoresis 4mg /ml Dexamethasone;Dry needling    PT Next Visit Plan scap stab/postural strengthening, manual/modalities as indicated    Consulted and Agree with Plan of Care Patient             Patient will benefit from skilled therapeutic intervention in order to improve the following deficits and impairments:  Decreased range of motion, Decreased activity tolerance, Pain, Hypomobility, Impaired flexibility,  Decreased strength, Increased muscle spasms, Impaired UE functional use, Postural dysfunction  Visit Diagnosis: Pain in thoracic spine  Muscle weakness (generalized)  Abnormal posture  Cramp and spasm     Problem List Patient Active Problem List   Diagnosis Date Noted   OA (osteoarthritis) of knee 07/18/2019   Primary osteoarthritis of left knee 07/18/2019   09/17/2019, PT, DPT Lysle Rubens Dnyla Antonetti 10/02/2020, 11:33 AM  [facility] [place] [facility] [address] [place], [place], [place] Phone: [phone]   Fax:  [phone]  Name: Vanessa Blair MRN: [name] Date of Birth: Jul 09, 1950

## 2020-10-03 ENCOUNTER — Other Ambulatory Visit: Payer: Self-pay

## 2020-10-03 ENCOUNTER — Ambulatory Visit: Payer: Medicare Other | Admitting: Physical Therapy

## 2020-10-03 ENCOUNTER — Encounter: Payer: Self-pay | Admitting: Physical Therapy

## 2020-10-03 DIAGNOSIS — M6281 Muscle weakness (generalized): Secondary | ICD-10-CM

## 2020-10-03 DIAGNOSIS — M546 Pain in thoracic spine: Secondary | ICD-10-CM

## 2020-10-03 DIAGNOSIS — R600 Localized edema: Secondary | ICD-10-CM

## 2020-10-03 DIAGNOSIS — M25562 Pain in left knee: Secondary | ICD-10-CM

## 2020-10-03 DIAGNOSIS — R293 Abnormal posture: Secondary | ICD-10-CM

## 2020-10-03 DIAGNOSIS — R252 Cramp and spasm: Secondary | ICD-10-CM

## 2020-10-03 NOTE — Therapy (Signed)
[facility] [place] [facility] [address] [place], [place], [address] Phone: [phone]   Fax:  [phone]  Physical Therapy Treatment  Patient Details  Name: Vanessa Blair MRN: [id number] Date of Birth: [birth year] Referring Provider (PT): Shon Baton   Encounter Date: 10/03/2020   PT End of Session - 10/03/20 1514     Visit Number 4    Date for PT Re-Evaluation 12/13/20    PT Start Time 0230    PT Stop Time 0311    PT Time Calculation (min) 41 min    Activity Tolerance Patient tolerated treatment well    Behavior During Therapy [facility] for tasks assessed/performed             Past Medical History:  Diagnosis Date   Anemia    Arthritis    Hypercholesteremia    Hypertension     Past Surgical History:  Procedure Laterality Date   BREAST SURGERY     TOTAL HIP ARTHROPLASTY     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   Subjective Assessment - 10/03/20 1431     Subjective Pt is doing well no pain right now but she did earlier in her R shoulder that was a 7/10. Her pain happens at night when she lays on her side.    Currently in Pain? No/denies                               [facility] Adult PT Treatment/Exercise - 10/03/20 0001       Neck Exercises: Machines for Strengthening   UBE (Upper Arm Bike) L2 x 3 min each    Cybex Row 20# 2x12      Shoulder Exercises: Supine   Other Supine Exercises star gazer stretch 2x30 sec    Other Supine Exercises supine march red theraband 2x10      Shoulder Exercises: Seated   Retraction 20 reps;Strengthening;Both    Horizontal ABduction Both;20 reps;Theraband    Theraband Level (Shoulder Horizontal ABduction) Level 2 (Red)    Other Seated Exercises sitting on dyna disc diagnals 2# 2x10     Other Seated Exercises sit to stands yellow ball OHP 2x10      Shoulder Exercises: Standing   Flexion Both;20 reps;Weights    Shoulder Flexion Weight (lbs) 3#    ABduction Both;20 reps;Weights   3#   Other Standing Exercises trunk ext with yellow ball to wall, yellow ball twist towards wall 2x10      Manual Therapy   Manual Therapy Soft tissue mobilization    Manual therapy comments Rhomboids, upper traps                      PT Short Term Goals - 10/02/20 1133       PT SHORT TERM GOAL #1   Title Pt will be independent with HEP    Time 2    Period Weeks    Status Achieved    Target Date 10/04/20               PT Long Term Goals - 09/20/20 1359       PT LONG TERM GOAL #1   Title Pt will report 50% reduction  in thoracic pain    Time 6    Period Weeks    Status New    Target Date 11/01/20      PT LONG TERM GOAL #2   Title Pt will demo understanding of posture/body mechanics    Time 6    Period Weeks    Status New    Target Date 11/01/20      PT LONG TERM GOAL #3   Title Pt will report ability to complete all ADLs with no increase in thoracic pain    Time 6    Period Weeks    Status New    Target Date 11/01/20                   Plan - 10/03/20 1512     Clinical Impression Statement Pt tolerated exercises well. She had shoulder pain earlier in the day. Continued to progress exercises with cueing for posture and decreasing shouler hike. Soft tissue mobilization done to help decrease tightness in rhomboid and upper trap area.    PT Treatment/Interventions ADLs/Self Care Home Management;Cryotherapy;Electrical Stimulation;Therapeutic activities;Therapeutic exercise;Balance training;Neuromuscular re-education;Manual techniques;Patient/family education;Passive range of motion;Moist Heat;Iontophoresis 4mg /ml Dexamethasone;Dry needling    PT Next Visit Plan scap stab/postural strengthening, manual/modalities as indicated    Consulted and Agree  with Plan of Care Patient             Patient will benefit from skilled therapeutic intervention in order to improve the following deficits and impairments:  Decreased range of motion, Decreased activity tolerance, Pain, Hypomobility, Impaired flexibility, Decreased strength, Increased muscle spasms, Impaired UE functional use, Postural dysfunction  Visit Diagnosis: Pain in thoracic spine  Muscle weakness (generalized)  Abnormal posture  Cramp and spasm  Localized edema  Acute pain of left knee     Problem List Patient Active Problem List   Diagnosis Date Noted   OA (osteoarthritis) of knee 07/18/2019   Primary osteoarthritis of left knee 07/18/2019    09/17/2019, SPTA 10/03/2020, 3:15 PM  [facility] [place] [facility] [address] [place], [place], [place] Phone: [phone]   Fax:  [phone]  Name: ASHANA TULLO MRN: [name] Date of Birth: [birth year]

## 2020-10-09 ENCOUNTER — Ambulatory Visit: Payer: Medicare Other | Admitting: Physical Therapy

## 2020-10-11 ENCOUNTER — Ambulatory Visit: Payer: Medicare Other | Admitting: Physical Therapy

## 2020-10-15 ENCOUNTER — Ambulatory Visit: Payer: Medicare Other | Attending: Orthopedic Surgery | Admitting: Physical Therapy

## 2020-10-15 ENCOUNTER — Other Ambulatory Visit: Payer: Self-pay

## 2020-10-15 ENCOUNTER — Encounter: Payer: Self-pay | Admitting: Physical Therapy

## 2020-10-15 DIAGNOSIS — R600 Localized edema: Secondary | ICD-10-CM | POA: Insufficient documentation

## 2020-10-15 DIAGNOSIS — M25562 Pain in left knee: Secondary | ICD-10-CM | POA: Diagnosis present

## 2020-10-15 DIAGNOSIS — M546 Pain in thoracic spine: Secondary | ICD-10-CM | POA: Insufficient documentation

## 2020-10-15 DIAGNOSIS — R293 Abnormal posture: Secondary | ICD-10-CM | POA: Insufficient documentation

## 2020-10-15 DIAGNOSIS — M6281 Muscle weakness (generalized): Secondary | ICD-10-CM | POA: Diagnosis present

## 2020-10-15 DIAGNOSIS — R252 Cramp and spasm: Secondary | ICD-10-CM | POA: Diagnosis present

## 2020-10-15 NOTE — Therapy (Signed)
[facility] [place] [facility] [address] [place], [place], [address] Phone: [phone]   Fax:  [phone]  Physical Therapy Treatment  Patient Details  Name: DENESE MENTINK MRN: [id number] Date of Birth: July 22, 1950 Referring Provider (PT): Shon Baton   Encounter Date: 10/15/2020   PT End of Session - 10/15/20 1224     Visit Number 5    Date for PT Re-Evaluation 12/13/20    PT Start Time 1132    PT Stop Time 1214    PT Time Calculation (min) 42 min    Activity Tolerance Patient tolerated treatment well    Behavior During Therapy [facility] for tasks assessed/performed             Past Medical History:  Diagnosis Date   Anemia    Arthritis    Hypercholesteremia    Hypertension     Past Surgical History:  Procedure Laterality Date   BREAST SURGERY     TOTAL HIP ARTHROPLASTY     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   Subjective Assessment - 10/15/20 1131     Subjective Pt doing well today with no pain    Currently in Pain? No/denies                               [facility] Adult PT Treatment/Exercise - 10/15/20 0001       Neck Exercises: Machines for Strengthening   UBE (Upper Arm Bike) L2 x 2 min each    Nustep L5 x 6 min    Cybex Row 20# 2x12    Cybex Chest Press 5# 2x12    Lat Pull 20# 2x12    Other Machines for Strengthening standing shoulder extension 5# 2x10      Neck Exercises: Standing   Other Standing Exercises 2# flex/ext x10    Other Standing Exercises shoulder rolls 2# x10 each way, standing i's/y's/t's 2x5 2#      Neck Exercises: Seated   Other Seated Exercise STS 3x5 with yellow ball chest press                      PT Short Term Goals - 10/02/20 1133       PT SHORT TERM GOAL #1    Title Pt will be independent with HEP    Time 2    Period Weeks    Status Achieved    Target Date 10/04/20               PT Long Term Goals - 09/20/20 1359       PT LONG TERM GOAL #1   Title Pt will report 50% reduction in thoracic pain    Time 6    Period Weeks    Status New    Target Date 11/01/20      PT LONG TERM GOAL #2   Title Pt will demo understanding of posture/body mechanics    Time 6    Period Weeks    Status New    Target Date 11/01/20      PT LONG TERM GOAL #3   Title Pt will report ability to complete all ADLs with no increase in thoracic pain  Time 6    Period Weeks    Status New    Target Date 11/01/20                   Plan - 10/15/20 1227     Clinical Impression Statement Pt returns to PT following 1 week gap d/t sinus infection. Tolerated ex's well today but does report increased soreness d/t inactivity/tightness in back this past week. Cues to avoid shoulder elevation with ex's. Cues for form with standing i's/t's/y's. Pt reporting relief in tightness with continued exercise. Discussed continuation with PT for another few weeks working toward d/c with updated HEP.    PT Treatment/Interventions ADLs/Self Care Home Management;Cryotherapy;Electrical Stimulation;Therapeutic activities;Therapeutic exercise;Balance training;Neuromuscular re-education;Manual techniques;Patient/family education;Passive range of motion;Moist Heat;Iontophoresis 4mg /ml Dexamethasone;Dry needling    PT Next Visit Plan scap stab/postural strengthening, manual/modalities as indicated    Consulted and Agree with Plan of Care Patient             Patient will benefit from skilled therapeutic intervention in order to improve the following deficits and impairments:  Decreased range of motion, Decreased activity tolerance, Pain, Hypomobility, Impaired flexibility, Decreased strength, Increased muscle spasms, Impaired UE functional use, Postural dysfunction  Visit  Diagnosis: Pain in thoracic spine  Muscle weakness (generalized)  Abnormal posture  Cramp and spasm     Problem List Patient Active Problem List   Diagnosis Date Noted   OA (osteoarthritis) of knee 07/18/2019   Primary osteoarthritis of left knee 07/18/2019   09/17/2019, PT, DPT Lysle Rubens Edinson Domeier 10/15/2020, 12:31 PM  [facility] [place] [facility] [place] [address] [place], [place], [place] Phone: [phone]   Fax:  [phone]  Name: DELORAS REICHARD MRN: [name] Date of Birth: 1950/08/20

## 2020-10-17 ENCOUNTER — Other Ambulatory Visit: Payer: Self-pay

## 2020-10-17 ENCOUNTER — Ambulatory Visit: Payer: Medicare Other | Admitting: Physical Therapy

## 2020-10-17 ENCOUNTER — Encounter: Payer: Self-pay | Admitting: Physical Therapy

## 2020-10-17 DIAGNOSIS — M6281 Muscle weakness (generalized): Secondary | ICD-10-CM

## 2020-10-17 DIAGNOSIS — M546 Pain in thoracic spine: Secondary | ICD-10-CM | POA: Diagnosis not present

## 2020-10-17 DIAGNOSIS — R600 Localized edema: Secondary | ICD-10-CM

## 2020-10-17 DIAGNOSIS — M25562 Pain in left knee: Secondary | ICD-10-CM

## 2020-10-17 DIAGNOSIS — R293 Abnormal posture: Secondary | ICD-10-CM

## 2020-10-17 DIAGNOSIS — R252 Cramp and spasm: Secondary | ICD-10-CM

## 2020-10-17 NOTE — Therapy (Signed)
[facility] [place] [facility] [address] [place], [place], [address] Phone: [phone]   Fax:  [phone]  Physical Therapy Treatment  Patient Details  Name: Vanessa Blair MRN: [id number] Date of Birth: 1950/09/26 Referring Provider (PT): Shon Baton   Encounter Date: 10/17/2020   PT End of Session - 10/17/20 1148     Visit Number 6    Date for PT Re-Evaluation 12/13/20    PT Start Time 1057    PT Stop Time 1140    PT Time Calculation (min) 43 min    Activity Tolerance Patient tolerated treatment well    Behavior During Therapy [facility] for tasks assessed/performed             Past Medical History:  Diagnosis Date   Anemia    Arthritis    Hypercholesteremia    Hypertension     Past Surgical History:  Procedure Laterality Date   BREAST SURGERY     TOTAL HIP ARTHROPLASTY     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   Subjective Assessment - 10/17/20 1100     Subjective Pt reports she is doing well. She was cleaning ceiling fans earlier today.    Currently in Pain? Yes    Pain Score 5     Pain Location Thoracic                               [facility] Adult PT Treatment/Exercise - 10/17/20 0001       Self-Care   Self-Care ADL's;Posture   Educated about posture while cleaning and moving around, and use of hip hinge.     Neck Exercises: Machines for Strengthening   UBE (Upper Arm Bike) L2 x each    Cybex Row 20# 2x12    Cybex Chest Press 10# 2x10    Other Machines for Strengthening standing shoulder extension 5# 2x10    Other Machines for Strengthening AR press 10# x10      Neck Exercises: Standing   Other Standing Exercises trunk extension against the wall with yellow ball 2x10,w's against wall 2x10    Other  Standing Exercises 3# flex/abd 2x10      Shoulder Exercises: Seated   Retraction 10 reps;Both      Shoulder Exercises: Standing   Horizontal ABduction Both;20 reps    Theraband Level (Shoulder Horizontal ABduction) Level 1 (Yellow)                      PT Short Term Goals - 10/02/20 1133       PT SHORT TERM GOAL #1   Title Pt will be independent with HEP    Time 2    Period Weeks    Status Achieved    Target Date 10/04/20               PT Long Term Goals - 10/17/20 1145       PT LONG TERM GOAL #1   Title Pt will report 50% reduction in thoracic pain    Baseline 90 AROM    Time 6    Status On-going      PT LONG TERM GOAL #2   Title Pt will demo understanding  of posture/body mechanics    Baseline 3 deg    Time 6    Period Weeks    Status On-going      PT LONG TERM GOAL #3   Title Pt will report ability to complete all ADLs with no increase in thoracic pain    Time 6    Period Weeks    Status On-going      PT LONG TERM GOAL #4   Title Pt will demonstrate TUG without AD <13 sec    Baseline 15.59 sec    Time 8    Status Achieved      PT LONG TERM GOAL #5   Title Pt will demonstrate <3cm difference in swelling between R and L knee    Baseline >5cm difference    Time 8    Period Weeks    Status On-going                   Plan - 10/17/20 1140     Clinical Impression Statement Pt tolerated progression of all exercises well today. She has some increased pain today due to cleaning ceiling fans prior to treatment. Pt continues to have rounded shoulders thorughout exercises and required cues for posture as well a decrease shoulder hiking. Educated importance of body mechanics while cleaning and moving throughout the day. Pt reported feeling well following treatment.    PT Treatment/Interventions ADLs/Self Care Home Management;Cryotherapy;Electrical Stimulation;Therapeutic activities;Therapeutic exercise;Balance training;Neuromuscular  re-education;Manual techniques;Patient/family education;Passive range of motion;Moist Heat;Iontophoresis 4mg /ml Dexamethasone;Dry needling    PT Next Visit Plan scap stab/postural strengthening, manual/modalities as indicated             Patient will benefit from skilled therapeutic intervention in order to improve the following deficits and impairments:  Decreased range of motion, Decreased activity tolerance, Pain, Hypomobility, Impaired flexibility, Decreased strength, Increased muscle spasms, Impaired UE functional use, Postural dysfunction  Visit Diagnosis: Pain in thoracic spine  Muscle weakness (generalized)  Abnormal posture  Cramp and spasm  Localized edema  Acute pain of left knee     Problem List Patient Active Problem List   Diagnosis Date Noted   OA (osteoarthritis) of knee 07/18/2019   Primary osteoarthritis of left knee 07/18/2019    09/17/2019, SPTA 10/17/2020, 11:49 AM  [facility] [place] [facility] [address] [place], [place], [place] Phone: [phone]   Fax:  [phone]  Name: Vanessa Blair MRN: Vanessa Blair Date of Birth: [birth year]

## 2020-10-22 ENCOUNTER — Other Ambulatory Visit: Payer: Self-pay

## 2020-10-22 ENCOUNTER — Encounter: Payer: Self-pay | Admitting: Physical Therapy

## 2020-10-22 ENCOUNTER — Ambulatory Visit: Payer: Medicare Other | Admitting: Physical Therapy

## 2020-10-22 DIAGNOSIS — M6281 Muscle weakness (generalized): Secondary | ICD-10-CM

## 2020-10-22 DIAGNOSIS — M25562 Pain in left knee: Secondary | ICD-10-CM

## 2020-10-22 DIAGNOSIS — R293 Abnormal posture: Secondary | ICD-10-CM

## 2020-10-22 DIAGNOSIS — R600 Localized edema: Secondary | ICD-10-CM

## 2020-10-22 DIAGNOSIS — M546 Pain in thoracic spine: Secondary | ICD-10-CM

## 2020-10-22 DIAGNOSIS — R252 Cramp and spasm: Secondary | ICD-10-CM

## 2020-10-22 NOTE — Therapy (Signed)
[facility] [place] [facility] [address] [place], [place], [address] Phone: [phone]   Fax:  [phone]  Physical Therapy Treatment  Patient Details  Name: Vanessa Blair MRN: [id number] Date of Birth: 1950-05-26 Referring Provider (PT): Shon Baton   Encounter Date: 10/22/2020   PT End of Session - 10/22/20 1225     Visit Number 7    Date for PT Re-Evaluation 12/13/20    PT Start Time 1143    PT Stop Time 1222    PT Time Calculation (min) 39 min    Activity Tolerance Patient tolerated treatment well    Behavior During Therapy [facility] for tasks assessed/performed             Past Medical History:  Diagnosis Date   Anemia    Arthritis    Hypercholesteremia    Hypertension     Past Surgical History:  Procedure Laterality Date   BREAST SURGERY     TOTAL HIP ARTHROPLASTY     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   Subjective Assessment - 10/22/20 1145     Subjective Pt reports she is doing well.    Currently in Pain? Yes    Pain Score 5     Pain Location Shoulder    Pain Orientation Right                               [facility] Adult PT Treatment/Exercise - 10/22/20 0001       Neck Exercises: Machines for Strengthening   Cybex Row 20# 2x10    Lat Pull 20# 2x10    Other Machines for Strengthening AR 10# x10, CW/CW circle x10      Neck Exercises: Standing   Other Standing Exercises trunk extension against the wall with yellow ball 2x10,w's against wall 2x10, snow angel against wall 2x10 2#, red tband 3 way scap stab x8      Neck Exercises: Seated   Other Seated Exercise STS 2x10 on airex yellow ball OHP, chest press    Other Seated Exercise horizontal abduction green tband x10      Shoulder Exercises:  ROM/Strengthening   UBE (Upper Arm Bike) L 2 x 3 min each way                      PT Short Term Goals - 10/02/20 1133       PT SHORT TERM GOAL #1   Title Pt will be independent with HEP    Time 2    Period Weeks    Status Achieved    Target Date 10/04/20               PT Long Term Goals - 10/22/20 1224       PT LONG TERM GOAL #1   Title Pt will report 50% reduction in thoracic pain    Baseline 90 AROM    Time 6    Period Weeks    Status On-going      PT LONG TERM GOAL #2   Title Pt will demo understanding of posture/body mechanics    Baseline 3 deg    Time 6    Period Weeks  Status On-going      PT LONG TERM GOAL #3   Title Pt will report ability to complete all ADLs with no increase in thoracic pain    Baseline ambulating with SBQC    Time 6    Period Weeks      PT LONG TERM GOAL #4   Title Pt will demonstrate TUG without AD <13 sec    Baseline 15.59 sec    Time 8    Status Achieved      PT LONG TERM GOAL #5   Title Pt will demonstrate <3cm difference in swelling between R and L knee    Baseline >5cm difference    Time 8    Status On-going                   Plan - 10/22/20 1222     Clinical Impression Statement Pt tolerated all exercsies well. She continues to have rounded shoulers throughout requiring cues for posture and decreasing shoulder hiking. Continued to strengthen scapular stabilizers and pt reported having a decrease in shoulder pain following treatment.    PT Treatment/Interventions ADLs/Self Care Home Management;Cryotherapy;Electrical Stimulation;Therapeutic activities;Therapeutic exercise;Balance training;Neuromuscular re-education;Manual techniques;Patient/family education;Passive range of motion;Moist Heat;Iontophoresis 4mg /ml Dexamethasone;Dry needling    PT Next Visit Plan scap stab/postural strengthening, manual/modalities as indicated             Patient will benefit from skilled therapeutic  intervention in order to improve the following deficits and impairments:  Decreased range of motion, Decreased activity tolerance, Pain, Hypomobility, Impaired flexibility, Decreased strength, Increased muscle spasms, Impaired UE functional use, Postural dysfunction  Visit Diagnosis: Pain in thoracic spine  Muscle weakness (generalized)  Abnormal posture  Cramp and spasm  Localized edema  Acute pain of left knee     Problem List Patient Active Problem List   Diagnosis Date Noted   OA (osteoarthritis) of knee 07/18/2019   Primary osteoarthritis of left knee 07/18/2019    09/17/2019, SPTA 10/22/2020, 12:46 PM  [facility] [place] [facility] [address] [place], [place], [place] Phone: [phone]   Fax:  [phone]  Name: Vanessa Blair MRN: [name] Date of Birth: Oct 24, 1950

## 2020-10-24 ENCOUNTER — Ambulatory Visit: Payer: Medicare Other | Admitting: Physical Therapy

## 2020-10-25 ENCOUNTER — Ambulatory Visit: Payer: Medicare Other | Admitting: Physical Therapy

## 2020-10-29 ENCOUNTER — Encounter: Payer: Self-pay | Admitting: Physical Therapy

## 2020-10-29 ENCOUNTER — Other Ambulatory Visit: Payer: Self-pay

## 2020-10-29 ENCOUNTER — Ambulatory Visit: Payer: Medicare Other | Admitting: Physical Therapy

## 2020-10-29 DIAGNOSIS — R293 Abnormal posture: Secondary | ICD-10-CM

## 2020-10-29 DIAGNOSIS — M546 Pain in thoracic spine: Secondary | ICD-10-CM | POA: Diagnosis not present

## 2020-10-29 DIAGNOSIS — R252 Cramp and spasm: Secondary | ICD-10-CM

## 2020-10-29 DIAGNOSIS — M6281 Muscle weakness (generalized): Secondary | ICD-10-CM

## 2020-10-29 NOTE — Therapy (Signed)
[facility] [place] [facility] [address] [place], [place], [address] Phone: [phone]   Fax:  [phone]  Physical Therapy Treatment  Patient Details  Name: ELLIOT Blair MRN: [id number] Date of Birth: [birth year] Referring Provider (PT): Shon Baton   Encounter Date: 10/29/2020   PT End of Session - 10/29/20 1303     Visit Number 8    Date for PT Re-Evaluation 12/13/20    PT Start Time 1216    PT Stop Time 1300    PT Time Calculation (min) 44 min    Activity Tolerance Patient tolerated treatment well    Behavior During Therapy [facility] for tasks assessed/performed             Past Medical History:  Diagnosis Date   Anemia    Arthritis    Hypercholesteremia    Hypertension     Past Surgical History:  Procedure Laterality Date   BREAST SURGERY     TOTAL HIP ARTHROPLASTY     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   Subjective Assessment - 10/29/20 1216     Subjective Pt reports increased R shoulder and thoracic pain/stiffness today    Currently in Pain? Yes    Pain Score 7     Pain Location Shoulder    Pain Orientation Right                               [facility] Adult PT Treatment/Exercise - 10/29/20 0001       Neck Exercises: Machines for Strengthening   UBE (Upper Arm Bike) L2 x each    Nustep L5 x 5 min    Cybex Row 20# 2x10    Cybex Chest Press 10# 2x10    Lat Pull 20# 2x10    Other Machines for Strengthening standing shoulder extension 5# 2x10    Other Machines for Strengthening AR press 10# x10 B      Neck Exercises: Seated   Other Seated Exercise STS 1x10                      PT Short Term Goals - 10/02/20 1133       PT SHORT TERM GOAL #1   Title Pt will be independent with HEP     Time 2    Period Weeks    Status Achieved    Target Date 10/04/20               PT Long Term Goals - 10/22/20 1224       PT LONG TERM GOAL #1   Title Pt will report 50% reduction in thoracic pain    Baseline 90 AROM    Time 6    Period Weeks    Status On-going      PT LONG TERM GOAL #2   Title Pt will demo understanding of posture/body mechanics    Baseline 3 deg    Time 6    Period Weeks    Status On-going      PT LONG TERM GOAL #3   Title Pt will report ability to complete all ADLs with no increase in thoracic pain    Baseline ambulating with [facility]  Time 6    Period Weeks      PT LONG TERM GOAL #4   Title Pt will demonstrate TUG without AD <13 sec    Baseline 15.59 sec    Time 8    Status Achieved      PT LONG TERM GOAL #5   Title Pt will demonstrate <3cm difference in swelling between R and L knee    Baseline >5cm difference    Time 8    Status On-going                   Plan - 10/29/20 1303     Clinical Impression Statement Pt presents to clinic with c/o increased pain in LB/thoracic region following walking/wearing heels this weekend along with increased R shoulder pain. After TE, states decreased pain levels and feels reduced stiffness. Cues throughout for upright posture. Education on maintaining postural neutral throughout her day.    PT Treatment/Interventions ADLs/Self Care Home Management;Cryotherapy;Electrical Stimulation;Therapeutic activities;Therapeutic exercise;Balance training;Neuromuscular re-education;Manual techniques;Patient/family education;Passive range of motion;Moist Heat;Iontophoresis 4mg /ml Dexamethasone;Dry needling    PT Next Visit Plan potential to decrease freq to 1x/week if indicated. scap stab/postural strengthening, manual/modalities as indicated    Consulted and Agree with Plan of Care Patient             Patient will benefit from skilled therapeutic intervention in order to improve the following deficits  and impairments:  Decreased range of motion, Decreased activity tolerance, Pain, Hypomobility, Impaired flexibility, Decreased strength, Increased muscle spasms, Impaired UE functional use, Postural dysfunction  Visit Diagnosis: Pain in thoracic spine  Muscle weakness (generalized)  Abnormal posture  Cramp and spasm     Problem List Patient Active Problem List   Diagnosis Date Noted   OA (osteoarthritis) of knee 07/18/2019   Primary osteoarthritis of left knee 07/18/2019   09/17/2019, PT, DPT Lysle Rubens Alyssamae Klinck 10/29/2020, 1:06 PM  [facility] [place] [facility] [address] [place], [place], [place] Phone: [phone]   Fax:  [phone]  Name: Vanessa Blair MRN: [name] Date of Birth: April 15, 1950

## 2020-10-31 ENCOUNTER — Ambulatory Visit: Payer: Medicare Other | Admitting: Physical Therapy

## 2020-11-05 ENCOUNTER — Encounter: Payer: Self-pay | Admitting: Physical Therapy

## 2020-11-05 ENCOUNTER — Other Ambulatory Visit: Payer: Self-pay

## 2020-11-05 ENCOUNTER — Ambulatory Visit: Payer: Medicare Other | Attending: Orthopedic Surgery | Admitting: Physical Therapy

## 2020-11-05 DIAGNOSIS — M6281 Muscle weakness (generalized): Secondary | ICD-10-CM

## 2020-11-05 DIAGNOSIS — M25562 Pain in left knee: Secondary | ICD-10-CM | POA: Insufficient documentation

## 2020-11-05 DIAGNOSIS — R600 Localized edema: Secondary | ICD-10-CM | POA: Insufficient documentation

## 2020-11-05 DIAGNOSIS — R252 Cramp and spasm: Secondary | ICD-10-CM | POA: Diagnosis present

## 2020-11-05 DIAGNOSIS — M546 Pain in thoracic spine: Secondary | ICD-10-CM | POA: Insufficient documentation

## 2020-11-05 DIAGNOSIS — R293 Abnormal posture: Secondary | ICD-10-CM | POA: Diagnosis present

## 2020-11-05 NOTE — Therapy (Signed)
[facility] [address]. [place], [place], [address] Phone: [phone]   Fax:  [phone]  Physical Therapy Progress Note  Patient Details  Name: Vanessa LINDERS MRN: [id number] Date of Birth: 1950/09/24 Referring Provider (PT): Shon Baton   Progress Note Reporting Period 09/20/20 to 11/05/20  See note below for Objective Data and Assessment of Progress/Goals.      Encounter Date: 11/05/2020   PT End of Session - 11/05/20 1151     Visit Number 9    Date for PT Re-Evaluation 12/13/20    PT Start Time 1104    PT Stop Time 1145    PT Time Calculation (min) 41 min    Activity Tolerance Patient tolerated treatment well    Behavior During Therapy WFL for tasks assessed/performed             Past Medical History:  Diagnosis Date   Anemia    Arthritis    Hypercholesteremia    Hypertension     Past Surgical History:  Procedure Laterality Date   BREAST SURGERY     TOTAL HIP ARTHROPLASTY     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   Subjective Assessment - 11/05/20 1107     Subjective Can feel the R shoulder and thoracic pain mostly at night. Feels that she is trying her best to improve her posture. 50% improvement. Still has pain with heavy lifting and bending her back.    Pertinent History R THA ~20 yrs ago, L TKA    Diagnostic tests xrays    Patient Stated Goals get rid of the pain; feel better overall    Currently in Pain? Yes    Pain Score 8     Pain Location Back    Pain Orientation Upper;Mid    Pain Descriptors / Indicators Dull    Pain Type Chronic pain                               [facility] Adult PT Treatment/Exercise - 11/05/20 0001       Neck Exercises: Machines for Strengthening   UBE  (Upper Arm Bike) L2 x each      Neck Exercises: Standing   Other Standing Exercises thoracic extension at wall 10x to tolerance; R/L open book stretch at wall 10x each   cueing for proper set up/form     Neck Exercises: Seated   Other Seated Exercise thoracic extension over back of chair 2x10x 3" hold   10x each with/withour airex under bottom     Shoulder Exercises: Seated   Horizontal ABduction Strengthening;Both;10 reps;Theraband    Theraband Level (Shoulder Horizontal ABduction) Level 2 (Red)    Horizontal ABduction Limitations limited ROM    External Rotation Strengthening;Both;10 reps;Theraband    Theraband Level (Shoulder External Rotation) Level 2 (Red)    External Rotation Limitations cues to avoid over-retraction      Shoulder Exercises: Stretch   Corner Stretch 2 reps;30 seconds                    PT Education - 11/05/20 1150     Education Details update to HEP; discussion on objective progress and remaining impairments  Person(s) Educated Patient    Methods Explanation;Demonstration;Tactile cues;Verbal cues;Handout    Comprehension Verbalized understanding;Returned demonstration              PT Short Term Goals - 11/05/20 1155       PT SHORT TERM GOAL #1   Title Pt will be independent with HEP    Time 2    Period Weeks    Status Achieved    Target Date 10/04/20               PT Long Term Goals - 11/05/20 1155       PT LONG TERM GOAL #1   Title Pt will report 50% reduction in thoracic pain    Baseline 90 AROM    Time 6    Period Weeks    Status Achieved      PT LONG TERM GOAL #2   Title Pt will demo understanding of posture/body mechanics    Baseline 3 deg    Time 6    Period Weeks    Status Partially Met   reports improvement in posture, but still demonstrate rounded and forward head posture     PT LONG TERM GOAL #3   Title Pt will report ability to complete all ADLs with no increase in thoracic pain    Baseline  ambulating with SBQC    Time 6    Period Weeks    Status On-going   reports having to take 2 days to clean house d/t pain     PT LONG TERM GOAL #4   Title Pt will demonstrate TUG without AD <13 sec    Baseline 15.59 sec    Time 8    Status Achieved                   Plan - 11/05/20 1151     Clinical Impression Statement Patient arrived to session without new complaints. Reporting 50% improvement in her pain since initial eval. Notes that pain is worse at night, heavy lifting, and when bending her spine. Notes improvement in her awareness of her posture. However, notes that it still takes her 2 days to clean the house d/t pain, whereas it used to take her only 1 day. Patient performed gentle thoracic mobility work with good tolerance despite limited ROM. Periscapular strengthening was also well-tolerated but required cues to avoid over-retraction of scapulae. Patient reported understanding of HEP update and noted "my back feels better" at end of session. Patient is progressing well towards goals. Would benefit from additional skilled PT services to address remaining goals.    Comorbidities arthritis, HTN    PT Treatment/Interventions ADLs/Self Care Home Management;Cryotherapy;Electrical Stimulation;Therapeutic activities;Therapeutic exercise;Balance training;Neuromuscular re-education;Manual techniques;Patient/family education;Passive range of motion;Moist Heat;Iontophoresis 4mg /ml Dexamethasone;Dry needling    PT Next Visit Plan potential to decrease freq to 1x/week if indicated. scap stab/postural strengthening, manual/modalities as indicated    Consulted and Agree with Plan of Care Patient             Patient will benefit from skilled therapeutic intervention in order to improve the following deficits and impairments:  Decreased range of motion, Decreased activity tolerance, Pain, Hypomobility, Impaired flexibility, Decreased strength, Increased muscle spasms, Impaired UE  functional use, Postural dysfunction  Visit Diagnosis: Pain in thoracic spine  Muscle weakness (generalized)  Abnormal posture  Cramp and spasm     Problem List Patient Active Problem List   Diagnosis Date Noted   OA (osteoarthritis) of knee 07/18/2019  Primary osteoarthritis of left knee 07/18/2019     Janene Harvey, PT, DPT 11/05/20 11:59 AM    [place]. [place], [place], [address] Phone: [phone]   Fax:  [phone]  Name: GEORGENIA SALIM MRN: [id number] Date of Birth: [birth year]

## 2020-11-07 ENCOUNTER — Encounter: Payer: Self-pay | Admitting: Physical Therapy

## 2020-11-07 ENCOUNTER — Other Ambulatory Visit: Payer: Self-pay

## 2020-11-07 ENCOUNTER — Ambulatory Visit: Payer: Medicare Other | Admitting: Physical Therapy

## 2020-11-07 DIAGNOSIS — M546 Pain in thoracic spine: Secondary | ICD-10-CM | POA: Diagnosis not present

## 2020-11-07 DIAGNOSIS — R293 Abnormal posture: Secondary | ICD-10-CM

## 2020-11-07 DIAGNOSIS — M6281 Muscle weakness (generalized): Secondary | ICD-10-CM

## 2020-11-07 NOTE — Therapy (Signed)
[place]. [place], [place], [address] Phone: [phone]   Fax:  [phone]  Physical Therapy Treatment  Patient Details  Name: Vanessa Blair MRN: [id number] Date of Birth: May 02, 1950 Referring Provider (PT): Rolena Infante   Encounter Date: 11/07/2020   PT End of Session - 11/07/20 1225     Visit Number 10    Date for PT Re-Evaluation 12/13/20    PT Start Time 1143    PT Stop Time 1226    PT Time Calculation (min) 43 min    Activity Tolerance Patient tolerated treatment well    Behavior During Therapy [facility] for tasks assessed/performed             Past Medical History:  Diagnosis Date   Anemia    Arthritis    Hypercholesteremia    Hypertension     Past Surgical History:  Procedure Laterality Date   BREAST SURGERY     TOTAL HIP ARTHROPLASTY     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   Subjective Assessment - 11/07/20 1147     Subjective "Doing all right" some pain in the upper shoulders    Currently in Pain? Yes    Pain Score 8     Pain Location Back    Pain Orientation Upper                               [facility] Adult PT Treatment/Exercise - 11/07/20 0001       Neck Exercises: Machines for Strengthening   UBE (Upper Arm Bike) L2 x 16min each    Cybex Row 20# 2x10    Lat Pull 20# 2x10      Shoulder Exercises: Seated   Horizontal ABduction Strengthening;Both;10 reps;Theraband    Theraband Level (Shoulder Horizontal ABduction) Level 2 (Red)    External Rotation Strengthening;Both;10 reps;Theraband    Theraband Level (Shoulder External Rotation) Level 2 (Red)      Shoulder Exercises: Standing   Flexion Strengthening;Both;Weights;20 reps    Other Standing Exercises shrugs 4lb x10       Manual Therapy   Manual Therapy Soft tissue mobilization    Soft tissue mobilization Upper traps into rhomboids                      PT Short Term Goals - 11/05/20 1155       PT SHORT TERM GOAL #1   Title Pt will be independent with HEP    Time 2    Period Weeks    Status Achieved    Target Date 10/04/20               PT Long Term Goals - 11/05/20 1155       PT LONG TERM GOAL #1   Title Pt will report 50% reduction in thoracic pain    Baseline 90 AROM    Time 6    Period Weeks    Status Achieved      PT LONG TERM GOAL #2   Title Pt will demo understanding of posture/body mechanics    Baseline 3 deg    Time 6    Period Weeks    Status Partially  Met   reports improvement in posture, but still demonstrate rounded and forward head posture     PT LONG TERM GOAL #3   Title Pt will report ability to complete all ADLs with no increase in thoracic pain    Baseline ambulating with SBQC    Time 6    Period Weeks    Status On-going   reports having to take 2 days to clean house d/t pain     PT LONG TERM GOAL #4   Title Pt will demonstrate TUG without AD <13 sec    Baseline 15.59 sec    Time 8    Status Achieved                   Plan - 11/07/20 1225     Clinical Impression Statement Pt did really well today. Positive response to STM evident bu decrease pain and increase tissue elasticity. Pt has forward head and rounded shoulders, requiring cues to correct posture throughout session. Tactile cue to elbows needed with external rotation.    Personal Factors and Comorbidities Age;Comorbidity 2    Examination-Activity Limitations Bend;Carry;Lift    Examination-Participation Restrictions Meal Prep;Cleaning;Community Activity;Shop;Laundry    Stability/Clinical Decision Making Stable/Uncomplicated    Rehab Potential Good    PT Frequency 2x / week    PT Duration 6 weeks    PT Treatment/Interventions ADLs/Self Care Home  Management;Cryotherapy;Electrical Stimulation;Therapeutic activities;Therapeutic exercise;Balance training;Neuromuscular re-education;Manual techniques;Patient/family education;Passive range of motion;Moist Heat;Iontophoresis 4mg /ml Dexamethasone;Dry needling    PT Next Visit Plan potential to decrease freq to 1x/week if indicated. scap stab/postural strengthening, manual/modalities as indicated             Patient will benefit from skilled therapeutic intervention in order to improve the following deficits and impairments:  Decreased range of motion, Decreased activity tolerance, Pain, Hypomobility, Impaired flexibility, Decreased strength, Increased muscle spasms, Impaired UE functional use, Postural dysfunction  Visit Diagnosis: Muscle weakness (generalized)  Abnormal posture  Pain in thoracic spine     Problem List Patient Active Problem List   Diagnosis Date Noted   OA (osteoarthritis) of knee 07/18/2019   Primary osteoarthritis of left knee 07/18/2019    Scot Jun 11/07/2020, 12:28 PM  [place]. [place], [place], [address] Phone: [phone]   Fax:  [phone]  Name: Vanessa Blair MRN: [id number] Date of Birth: 07/11/1950

## 2020-11-13 ENCOUNTER — Encounter: Payer: Self-pay | Admitting: Physical Therapy

## 2020-11-13 ENCOUNTER — Other Ambulatory Visit: Payer: Self-pay

## 2020-11-13 ENCOUNTER — Ambulatory Visit: Payer: Medicare Other | Admitting: Physical Therapy

## 2020-11-13 DIAGNOSIS — R252 Cramp and spasm: Secondary | ICD-10-CM

## 2020-11-13 DIAGNOSIS — R293 Abnormal posture: Secondary | ICD-10-CM

## 2020-11-13 DIAGNOSIS — M546 Pain in thoracic spine: Secondary | ICD-10-CM

## 2020-11-13 DIAGNOSIS — M6281 Muscle weakness (generalized): Secondary | ICD-10-CM

## 2020-11-13 DIAGNOSIS — R600 Localized edema: Secondary | ICD-10-CM

## 2020-11-13 NOTE — Therapy (Signed)
[place]. [place], [place], [address] Phone: [phone]   Fax:  [phone]  Physical Therapy Treatment  Patient Details  Name: Vanessa Blair MRN: [id number] Date of Birth: [birth year] Referring Provider (PT): Rolena Infante   Encounter Date: 11/13/2020   PT End of Session - 11/13/20 1341     Visit Number 11    Date for PT Re-Evaluation 12/13/20    PT Start Time 1300    PT Stop Time 1342    PT Time Calculation (min) 42 min             Past Medical History:  Diagnosis Date   Anemia    Arthritis    Hypercholesteremia    Hypertension     Past Surgical History:  Procedure Laterality Date   BREAST SURGERY     TOTAL HIP ARTHROPLASTY     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   Subjective Assessment - 11/13/20 1300     Subjective "Good"    Pertinent History R THA ~20 yrs ago, L TKA    Currently in Pain? No/denies                               [facility] Adult PT Treatment/Exercise - 11/13/20 0001       Exercises   Exercises Neck      Neck Exercises: Machines for Strengthening   UBE (Upper Arm Bike) L2 x 85mn each    Cybex Row 20# 2x10    Lat Pull 20# 2x10      Neck Exercises: Standing   Other Standing Exercises Shoulder flex & abd 2lb 2x10      Neck Exercises: Seated   Other Seated Exercise OHP yellow ball 2x10      Shoulder Exercises: Seated   Horizontal ABduction Strengthening;Both;10 reps;Theraband    Theraband Level (Shoulder Horizontal ABduction) Level 2 (Red)    External Rotation Strengthening;Both;10 reps;Theraband    Theraband Level (Shoulder External Rotation) Level 2 (Red)      Manual Therapy   Manual Therapy Soft tissue mobilization    Soft tissue mobilization Upper  traps into rhomboids                      PT Short Term Goals - 11/05/20 1155       PT SHORT TERM GOAL #1   Title Pt will be independent with HEP    Time 2    Period Weeks    Status Achieved    Target Date 10/04/20               PT Long Term Goals - 11/05/20 1155       PT LONG TERM GOAL #1   Title Pt will report 50% reduction in thoracic pain    Baseline 90 AROM    Time 6    Period Weeks    Status Achieved      PT LONG TERM GOAL #2   Title Pt will demo understanding of posture/body mechanics    Baseline 3 deg    Time 6    Period Weeks    Status Partially Met   reports improvement in posture, but still demonstrate  rounded and forward head posture     PT LONG TERM GOAL #3   Title Pt will report ability to complete all ADLs with no increase in thoracic pain    Baseline ambulating with SBQC    Time 6    Period Weeks    Status On-going   reports having to take 2 days to clean house d/t pain     PT LONG TERM GOAL #4   Title Pt will demonstrate TUG without AD <13 sec    Baseline 15.59 sec    Time 8    Status Achieved                   Plan - 11/13/20 1342     Clinical Impression Statement Pt is progressing well reporting decrease pain. No issue with today's interventions. some fatigue noted with horizontal shoulder abduction. Good tissue elasticity noted with STM. Tactile cue to keep shoulder back with seated rows. Cues to reinforce posture throughout, pt has forward head and rounded shoulders.    Personal Factors and Comorbidities Age;Comorbidity 2    Comorbidities arthritis, HTN    Examination-Activity Limitations Bend;Carry;Lift    Examination-Participation Restrictions Meal Prep;Cleaning;Community Activity;Shop;Laundry    Stability/Clinical Decision Making Stable/Uncomplicated    Rehab Potential Good    PT Frequency 2x / week    PT Duration 6 weeks    PT Treatment/Interventions ADLs/Self Care Home Management;Cryotherapy;Electrical  Stimulation;Therapeutic activities;Therapeutic exercise;Balance training;Neuromuscular re-education;Manual techniques;Patient/family education;Passive range of motion;Moist Heat;Iontophoresis 56m/ml Dexamethasone;Dry needling    PT Next Visit Plan potential to decrease freq to 1x/week if indicated. scap stab/postural strengthening, manual/modalities as indicated             Patient will benefit from skilled therapeutic intervention in order to improve the following deficits and impairments:  Decreased range of motion, Decreased activity tolerance, Pain, Hypomobility, Impaired flexibility, Decreased strength, Increased muscle spasms, Impaired UE functional use, Postural dysfunction  Visit Diagnosis: Muscle weakness (generalized)  Abnormal posture  Pain in thoracic spine  Cramp and spasm  Localized edema     Problem List Patient Active Problem List   Diagnosis Date Noted   OA (osteoarthritis) of knee 07/18/2019   Primary osteoarthritis of left knee 07/18/2019    RScot Jun8/12/2020, 1:45 PM  [place] [address]Phone: [phone]  Fax:  [phone] Name: Vanessa WINSTANLEYMRN: [id number] of Birth: 526-Mar-1952

## 2020-11-14 ENCOUNTER — Encounter: Payer: Self-pay | Admitting: Physical Therapy

## 2020-11-14 ENCOUNTER — Ambulatory Visit: Payer: Medicare Other | Admitting: Physical Therapy

## 2020-11-14 DIAGNOSIS — M546 Pain in thoracic spine: Secondary | ICD-10-CM | POA: Diagnosis not present

## 2020-11-14 DIAGNOSIS — M6281 Muscle weakness (generalized): Secondary | ICD-10-CM

## 2020-11-14 DIAGNOSIS — R293 Abnormal posture: Secondary | ICD-10-CM

## 2020-11-14 NOTE — Therapy (Signed)
[place]. [place], [place], [address] Phone: [phone]   Fax:  [phone]  Physical Therapy Treatment  Patient Details  Name: Vanessa Blair MRN: [id number] Date of Birth: October 20, 1950 Referring Provider (PT): Rolena Infante   Encounter Date: 11/14/2020   PT End of Session - 11/14/20 1344     Visit Number 12    Date for PT Re-Evaluation 12/13/20    PT Start Time 1300    PT Stop Time 1344    PT Time Calculation (min) 44 min    Activity Tolerance Patient tolerated treatment well    Behavior During Therapy [facility] for tasks assessed/performed             Past Medical History:  Diagnosis Date   Anemia    Arthritis    Hypercholesteremia    Hypertension     Past Surgical History:  Procedure Laterality Date   BREAST SURGERY     TOTAL HIP ARTHROPLASTY     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   Subjective Assessment - 11/14/20 1304     Subjective "I am doing good "    Currently in Pain? Yes    Pain Score 3     Pain Location Back    Pain Orientation Upper                               [facility] Adult PT Treatment/Exercise - 11/14/20 0001       Neck Exercises: Machines for Strengthening   UBE (Upper Arm Bike) L2.2 x 20min each    Cybex Row 20# 2x10    Lat Pull 20# 2x10      Neck Exercises: Standing   Other Standing Exercises Shoulder flex & abd 2lb 2x10      Neck Exercises: Seated   Neck Retraction 20 reps;3 secs    Other Seated Exercise OHP yellow ball 2x10    Other Seated Exercise S2S chest press yellow ball 2x5      Shoulder Exercises: Seated   Row Strengthening;Both;20 reps;Theraband    Theraband Level (Shoulder Row) Level 3 (Green)      Manual Therapy   Manual Therapy Soft tissue  mobilization    Soft tissue mobilization Upper traps into rhomboids                      PT Short Term Goals - 11/05/20 1155       PT SHORT TERM GOAL #1   Title Pt will be independent with HEP    Time 2    Period Weeks    Status Achieved    Target Date 10/04/20               PT Long Term Goals - 11/14/20 1336       PT LONG TERM GOAL #2   Title Pt will demo understanding of posture/body mechanics    Status Partially Met      PT LONG TERM GOAL #3   Title Pt will report ability to complete all ADLs with no increase in thoracic pain    Status Partially Met   low back fatigue     PT LONG TERM GOAL #4  Title Pt will demonstrate TUG without AD <13 sec    Status Achieved                   Plan - 11/14/20 1344     Clinical Impression Statement Pt continues to do well and reports improvement overall since starting therapy. cue not to allow shoulders to protract needed during seated rows. Some UE fatigue reported with shoulder flexion and abduction. Some core weakens noted with sit to stand and chest press.    Personal Factors and Comorbidities Age;Comorbidity 2    Comorbidities arthritis, HTN    Examination-Activity Limitations Bend;Carry;Lift    Examination-Participation Restrictions Meal Prep;Cleaning;Community Activity;Shop;Laundry    Stability/Clinical Decision Making Stable/Uncomplicated    Rehab Potential Good    PT Frequency 2x / week    PT Duration 6 weeks    PT Treatment/Interventions ADLs/Self Care Home Management;Cryotherapy;Electrical Stimulation;Therapeutic activities;Therapeutic exercise;Balance training;Neuromuscular re-education;Manual techniques;Patient/family education;Passive range of motion;Moist Heat;Iontophoresis 66m/ml Dexamethasone;Dry needling    PT Next Visit Plan potential to decrease freq to 1x/week if indicated. scap stab/postural strengthening, manual/modalities as indicated             Patient will benefit from  skilled therapeutic intervention in order to improve the following deficits and impairments:  Decreased range of motion, Decreased activity tolerance, Pain, Hypomobility, Impaired flexibility, Decreased strength, Increased muscle spasms, Impaired UE functional use, Postural dysfunction  Visit Diagnosis: Muscle weakness (generalized)  Pain in thoracic spine  Abnormal posture     Problem List Patient Active Problem List   Diagnosis Date Noted   OA (osteoarthritis) of knee 07/18/2019   Primary osteoarthritis of left knee 07/18/2019    RScot Jun8/01/2021, 1:48 PM  [place] [address]Phone: [phone]  Fax:  [phone] Name: Vanessa DINIMRN: [id number] of Birth: [birth year]

## 2020-11-19 ENCOUNTER — Encounter: Payer: Self-pay | Admitting: Rehabilitative and Restorative Service Providers"

## 2020-11-19 ENCOUNTER — Other Ambulatory Visit: Payer: Self-pay

## 2020-11-19 ENCOUNTER — Ambulatory Visit: Payer: Medicare Other | Admitting: Rehabilitative and Restorative Service Providers"

## 2020-11-19 DIAGNOSIS — R293 Abnormal posture: Secondary | ICD-10-CM

## 2020-11-19 DIAGNOSIS — R252 Cramp and spasm: Secondary | ICD-10-CM

## 2020-11-19 DIAGNOSIS — M6281 Muscle weakness (generalized): Secondary | ICD-10-CM

## 2020-11-19 DIAGNOSIS — M546 Pain in thoracic spine: Secondary | ICD-10-CM | POA: Diagnosis not present

## 2020-11-19 NOTE — Therapy (Signed)
[place]. [place], [place], [address] Phone: [phone]   Fax:  [phone]  Physical Therapy Treatment  Patient Details  Name: Vanessa Blair MRN: [id number] Date of Birth: 02-25-1951 Referring Provider (PT): Rolena Infante   Encounter Date: 11/19/2020   PT End of Session - 11/19/20 1105     Visit Number 13    Date for PT Re-Evaluation 12/13/20    PT Start Time 1100    PT Stop Time 1140    PT Time Calculation (min) 40 min    Activity Tolerance Patient tolerated treatment well    Behavior During Therapy [facility] for tasks assessed/performed             Past Medical History:  Diagnosis Date   Anemia    Arthritis    Hypercholesteremia    Hypertension     Past Surgical History:  Procedure Laterality Date   BREAST SURGERY     TOTAL HIP ARTHROPLASTY     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   Subjective Assessment - 11/19/20 1103     Subjective I had a nice weekend.    Patient Stated Goals get rid of the pain; feel better overall    Currently in Pain? Yes    Pain Score 3     Pain Location Back    Pain Orientation Upper    Pain Descriptors / Indicators Dull;Aching                               [facility] Adult PT Treatment/Exercise - 11/19/20 0001       Neck Exercises: Machines for Strengthening   UBE (Upper Arm Bike) L2.2 x 35min each    Cybex Row 20# 2x10    Lat Pull 20# 2x10    Other Machines for Strengthening standing shoulder extension 5# 2x10    Other Machines for Strengthening AR press 10# x10 B      Neck Exercises: Standing   Other Standing Exercises Shoulder flex & abd 2lb 2x10   cuing to maintain core stability     Neck Exercises: Seated   Neck Retraction 20 reps;3 secs    Other  Seated Exercise S2S chest press yellow ball x10, then S2S OHP yellow ball x10      Shoulder Exercises: Seated   Row Strengthening;Both;20 reps;Theraband    Theraband Level (Shoulder Row) Level 3 (Green)      Manual Therapy   Manual Therapy Soft tissue mobilization;Myofascial release    Manual therapy comments in sitting    Soft tissue mobilization Upper traps into rhomboids    Myofascial Release manual TP release to rhomboids and upper trap                      PT Short Term Goals - 11/05/20 1155       PT SHORT TERM GOAL #1   Title Pt will be independent with HEP    Time 2    Period Weeks    Status Achieved    Target Date 10/04/20               PT Long Term Goals - 11/19/20 1153  PT LONG TERM GOAL #2   Title Pt will demo understanding of posture/body mechanics    Status Partially Met      PT LONG TERM GOAL #3   Title Pt will report ability to complete all ADLs with no increase in thoracic pain    Status Partially Met                   Plan - 11/19/20 1150     Clinical Impression Statement Vanessa Blair is making great progress towards goal related activities.  She continues to have tight musculature and trigger points in upper traps and rhomboids.  Pt requires cuing throughout session for maintaining core stability and improved posture.  She tolerated exercises well and required only minimal seated recovery periods during session.  She continues to require skilled PT to progress towards goal related activiites.    PT Treatment/Interventions ADLs/Self Care Home Management;Cryotherapy;Electrical Stimulation;Therapeutic activities;Therapeutic exercise;Balance training;Neuromuscular re-education;Manual techniques;Patient/family education;Passive range of motion;Moist Heat;Iontophoresis 4mg /ml Dexamethasone;Dry needling    PT Next Visit Plan potential to decrease freq to 1x/week if indicated. scap stab/postural strengthening, manual/modalities as  indicated    Consulted and Agree with Plan of Care Patient             Patient will benefit from skilled therapeutic intervention in order to improve the following deficits and impairments:  Decreased range of motion, Decreased activity tolerance, Pain, Hypomobility, Impaired flexibility, Decreased strength, Increased muscle spasms, Impaired UE functional use, Postural dysfunction  Visit Diagnosis: Muscle weakness (generalized)  Pain in thoracic spine  Abnormal posture  Cramp and spasm     Problem List Patient Active Problem List   Diagnosis Date Noted   OA (osteoarthritis) of knee 07/18/2019   Primary osteoarthritis of left knee 07/18/2019    Juel Burrow, PT, DPT 11/19/2020, 11:56 AM  [place]. [place], [place], [address] Phone: [phone]   Fax:  [phone]  Name: Vanessa Blair MRN: [id number] Date of Birth: June 25, 1950

## 2020-11-21 ENCOUNTER — Other Ambulatory Visit: Payer: Self-pay

## 2020-11-21 ENCOUNTER — Ambulatory Visit: Payer: Medicare Other | Admitting: Physical Therapy

## 2020-11-21 ENCOUNTER — Ambulatory Visit: Payer: Medicare Other | Admitting: Rehabilitative and Restorative Service Providers"

## 2020-11-21 ENCOUNTER — Encounter: Payer: Self-pay | Admitting: Physical Therapy

## 2020-11-21 DIAGNOSIS — M546 Pain in thoracic spine: Secondary | ICD-10-CM

## 2020-11-21 DIAGNOSIS — R600 Localized edema: Secondary | ICD-10-CM

## 2020-11-21 DIAGNOSIS — R252 Cramp and spasm: Secondary | ICD-10-CM

## 2020-11-21 DIAGNOSIS — R293 Abnormal posture: Secondary | ICD-10-CM

## 2020-11-21 DIAGNOSIS — M6281 Muscle weakness (generalized): Secondary | ICD-10-CM

## 2020-11-21 NOTE — Therapy (Signed)
[place]. [place], [place], [address] Phone: [phone]   Fax:  [phone]  Physical Therapy Treatment  Patient Details  Name: Vanessa Blair MRN: [id number] Date of Birth: 1950/06/23 Referring Provider (PT): Rolena Infante   Encounter Date: 11/21/2020   PT End of Session - 11/21/20 1225     Visit Number 14    Date for PT Re-Evaluation 12/13/20    PT Start Time 1143    PT Stop Time 1225    PT Time Calculation (min) 42 min    Activity Tolerance Patient tolerated treatment well    Behavior During Therapy [facility] for tasks assessed/performed             Past Medical History:  Diagnosis Date   Anemia    Arthritis    Hypercholesteremia    Hypertension     Past Surgical History:  Procedure Laterality Date   BREAST SURGERY     TOTAL HIP ARTHROPLASTY     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   Subjective Assessment - 11/21/20 1147     Subjective "I feel all right"    Currently in Pain? Yes    Pain Score 3     Pain Location Back                               [facility] Adult PT Treatment/Exercise - 11/21/20 0001       Neck Exercises: Machines for Strengthening   UBE (Upper Arm Bike) L2.3 x 38min each    Cybex Row 25# 2x10    Lat Pull 25# 2x10    Other Machines for Strengthening standing shoulder extension 5# 2x10    Other Machines for Strengthening AR press 10# x10 B      Neck Exercises: Seated   Neck Retraction 20 reps;3 secs    Other Seated Exercise S2S chest press yellow ball x10, then S2S OHP yellow ball x10      Shoulder Exercises: Seated   Row Strengthening;Both;20 reps;Theraband    Theraband Level (Shoulder Row) Level 3 (Green)    External Rotation Strengthening;Both;10  reps;Theraband    Theraband Level (Shoulder External Rotation) Level 2 (Red)      Manual Therapy   Manual Therapy Soft tissue mobilization;Myofascial release    Manual therapy comments in sitting    Soft tissue mobilization Upper traps into rhomboids    Myofascial Release manual TP release to rhomboids and upper trap                      PT Short Term Goals - 11/05/20 1155       PT SHORT TERM GOAL #1   Title Pt will be independent with HEP    Time 2    Period Weeks    Status Achieved    Target Date 10/04/20               PT Long Term Goals - 11/19/20 1153       PT LONG TERM GOAL #2   Title Pt will demo understanding of posture/body mechanics    Status Partially Met      PT LONG TERM GOAL #3   Title  Pt will report ability to complete all ADLs with no increase in thoracic pain    Status Partially Met                   Plan - 11/21/20 1225     Clinical Impression Statement No issues completing today's interventions. She enters clinic with low pain rating without increase during interventions. Some tightness remains in her upper traps and rhomboids.  Positive response to STM, Postural cues needed with sit  stands not allow shoulders to protract with chest press    Personal Factors and Comorbidities Age;Comorbidity 2    Comorbidities arthritis, HTN    Examination-Activity Limitations Bend;Carry;Lift    Examination-Participation Restrictions Meal Prep;Cleaning;Community Activity;Shop;Laundry    Stability/Clinical Decision Making Stable/Uncomplicated    Rehab Potential Good    PT Frequency 2x / week    PT Duration 6 weeks    PT Treatment/Interventions ADLs/Self Care Home Management;Cryotherapy;Electrical Stimulation;Therapeutic activities;Therapeutic exercise;Balance training;Neuromuscular re-education;Manual techniques;Patient/family education;Passive range of motion;Moist Heat;Iontophoresis 4mg /ml Dexamethasone;Dry needling    PT Next Visit Plan  potential to decrease freq to 1x/week if indicated. scap stab/postural strengthening, manual/modalities as indicated             Patient will benefit from skilled therapeutic intervention in order to improve the following deficits and impairments:  Decreased range of motion, Decreased activity tolerance, Pain, Hypomobility, Impaired flexibility, Decreased strength, Increased muscle spasms, Impaired UE functional use, Postural dysfunction  Visit Diagnosis: Muscle weakness (generalized)  Pain in thoracic spine  Abnormal posture  Cramp and spasm  Localized edema     Problem List Patient Active Problem List   Diagnosis Date Noted   OA (osteoarthritis) of knee 07/18/2019   Primary osteoarthritis of left knee 07/18/2019    Scot Jun 11/21/2020, 12:29 PM  [place]. [place], [place], [address] Phone: [phone]   Fax:  [phone]  Name: Vanessa Blair MRN: [id number] Date of Birth: 02-Mar-1951

## 2020-11-26 ENCOUNTER — Other Ambulatory Visit: Payer: Self-pay

## 2020-11-26 ENCOUNTER — Ambulatory Visit: Payer: Medicare Other | Admitting: Physical Therapy

## 2020-11-26 ENCOUNTER — Encounter: Payer: Self-pay | Admitting: Physical Therapy

## 2020-11-26 DIAGNOSIS — M546 Pain in thoracic spine: Secondary | ICD-10-CM

## 2020-11-26 DIAGNOSIS — R252 Cramp and spasm: Secondary | ICD-10-CM

## 2020-11-26 DIAGNOSIS — M6281 Muscle weakness (generalized): Secondary | ICD-10-CM

## 2020-11-26 DIAGNOSIS — R600 Localized edema: Secondary | ICD-10-CM

## 2020-11-26 NOTE — Therapy (Signed)
[place]. [place], [place], [address] Phone: [phone]   Fax:  [phone]  Physical Therapy Treatment  Patient Details  Name: Vanessa Blair MRN: [id number] Date of Birth: 17-Jan-1951 Referring Provider (PT): Rolena Infante   Encounter Date: 11/26/2020   PT End of Session - 11/26/20 1142     Visit Number 15    Date for PT Re-Evaluation 12/13/20    PT Start Time 1100    PT Stop Time 1142    PT Time Calculation (min) 42 min    Activity Tolerance Patient tolerated treatment well    Behavior During Therapy [facility] for tasks assessed/performed             Past Medical History:  Diagnosis Date   Anemia    Arthritis    Hypercholesteremia    Hypertension     Past Surgical History:  Procedure Laterality Date   BREAST SURGERY     TOTAL HIP ARTHROPLASTY     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   Subjective Assessment - 11/26/20 1105     Subjective Doing ok    Currently in Pain? Yes    Pain Score 3     Pain Location Back                               [facility] Adult PT Treatment/Exercise - 11/26/20 0001       Neck Exercises: Machines for Strengthening   UBE (Upper Arm Bike) L2 x 5min each    Cybex Row 25# 2x10    Lat Pull 25# 2x10      Neck Exercises: Standing   Other Standing Exercises Shoulder flex & abd 2lb 2x10      Shoulder Exercises: Seated   Row Strengthening;Both;20 reps;Theraband    Theraband Level (Shoulder Row) Level 3 (Green)    Other Seated Exercises Horizontal abd green 2x10      Shoulder Exercises: Standing   Extension Strengthening;20 reps;Weights                      PT Short Term Goals - 11/05/20 1155       PT SHORT TERM GOAL #1   Title Pt will be  independent with HEP    Time 2    Period Weeks    Status Achieved    Target Date 10/04/20               PT Long Term Goals - 11/26/20 1109       PT LONG TERM GOAL #1   Title Pt will report 50% reduction in thoracic pain    Status Achieved      PT LONG TERM GOAL #2   Title Pt will demo understanding of posture/body mechanics    Status Partially Met      PT LONG TERM GOAL #3   Title Pt will report ability to complete all ADLs with no increase in thoracic pain    Status Partially Met                   Plan - 11/26/20 1143     Clinical Impression Statement Pt did well  completing today's  interventions.  Tactile cu for posture needed with seated rows and lats. Cues to squeeze shoulder bladed together with horizontal abduction. Pt reports some discomfort in her T spine when her spine begins to curve from scoliosis. Improved mobility reported post treatment session.    Comorbidities arthritis, HTN    Examination-Activity Limitations Bend;Carry;Lift    Examination-Participation Restrictions Meal Prep;Cleaning;Community Activity;Shop;Laundry    Stability/Clinical Decision Making Stable/Uncomplicated    Rehab Potential Good    PT Frequency 2x / week    PT Duration 6 weeks    PT Treatment/Interventions ADLs/Self Care Home Management;Cryotherapy;Electrical Stimulation;Therapeutic activities;Therapeutic exercise;Balance training;Neuromuscular re-education;Manual techniques;Patient/family education;Passive range of motion;Moist Heat;Iontophoresis 38m/ml Dexamethasone;Dry needling    PT Next Visit Plan . scap stab/postural strengthening, manual/modalities as indicated             Patient will benefit from skilled therapeutic intervention in order to improve the following deficits and impairments:  Decreased range of motion, Decreased activity tolerance, Pain, Hypomobility, Impaired flexibility, Decreased strength, Increased muscle spasms, Impaired UE functional use,  Postural dysfunction  Visit Diagnosis: Pain in thoracic spine  Muscle weakness (generalized)  Localized edema  Cramp and spasm     Problem List Patient Active Problem List   Diagnosis Date Noted   OA (osteoarthritis) of knee 07/18/2019   Primary osteoarthritis of left knee 07/18/2019    RScot Jun8/22/2022, 11:50 AM  [place] [address]Phone: [phone]  Fax:  [phone] Name: Vanessa HANGERMRN: [id number] of Birth: [birth year]

## 2020-11-28 ENCOUNTER — Ambulatory Visit: Payer: Medicare Other | Admitting: Physical Therapy

## 2020-11-29 ENCOUNTER — Encounter: Payer: Self-pay | Admitting: Physical Therapy

## 2020-11-29 ENCOUNTER — Other Ambulatory Visit: Payer: Self-pay

## 2020-11-29 ENCOUNTER — Ambulatory Visit: Payer: Medicare Other | Admitting: Physical Therapy

## 2020-11-29 DIAGNOSIS — M546 Pain in thoracic spine: Secondary | ICD-10-CM | POA: Diagnosis not present

## 2020-11-29 DIAGNOSIS — M6281 Muscle weakness (generalized): Secondary | ICD-10-CM

## 2020-11-29 DIAGNOSIS — R600 Localized edema: Secondary | ICD-10-CM

## 2020-11-29 NOTE — Therapy (Signed)
[place]. [place], [place], [address] Phone: [phone]   Fax:  [phone]  Physical Therapy Treatment  Patient Details  Name: Vanessa Blair MRN: [id number] Date of Birth: 1950-07-22 Referring Provider (PT): Rolena Infante   Encounter Date: 11/29/2020   PT End of Session - 11/29/20 1424     Visit Number 16    Date for PT Re-Evaluation 12/13/20    PT Start Time 1345    PT Stop Time 1426    PT Time Calculation (min) 41 min    Activity Tolerance Patient tolerated treatment well    Behavior During Therapy [facility] for tasks assessed/performed             Past Medical History:  Diagnosis Date   Anemia    Arthritis    Hypercholesteremia    Hypertension     Past Surgical History:  Procedure Laterality Date   BREAST SURGERY     TOTAL HIP ARTHROPLASTY     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   Subjective Assessment - 11/29/20 1352     Subjective Doing ok, just sore    Currently in Pain? Yes    Pain Score 3     Pain Location Back    Pain Descriptors / Indicators Sore                [facility] PT Assessment - 11/29/20 0001       AROM   Overall AROM Comments WFL                           [facility] Adult PT Treatment/Exercise - 11/29/20 0001       Neck Exercises: Machines for Strengthening   UBE (Upper Arm Bike) L2 x 2 min each    Nustep L5 x 6 min    Cybex Row 25# 2x12    Lat Pull 25# 2x12    Other Machines for Strengthening AR press 10# x10 B      Neck Exercises: Standing   Other Standing Exercises Shoulder flex & abd 2lb 2x10      Neck Exercises: Seated   Other Seated Exercise OHP yellow ball 2x10      Shoulder Exercises: Seated   External Rotation Strengthening;Both;10  reps;Theraband    Theraband Level (Shoulder External Rotation) Level 3 (Green)      Shoulder Exercises: Standing   Extension Strengthening;20 reps;Weights    Theraband Level (Shoulder Extension) Level 3 (Green)                      PT Short Term Goals - 11/05/20 1155       PT SHORT TERM GOAL #1   Title Pt will be independent with HEP    Time 2    Period Weeks    Status Achieved    Target Date 10/04/20               PT Long Term Goals - 11/26/20 1109       PT LONG TERM GOAL #1   Title Pt will report 50% reduction in thoracic pain    Status Achieved      PT LONG TERM GOAL #2   Title Pt  will demo understanding of posture/body mechanics    Status Partially Met      PT LONG TERM GOAL #3   Title Pt will report ability to complete all ADLs with no increase in thoracic pain    Status Partially Met                   Plan - 11/29/20 1425     Clinical Impression Statement Less pain reported overall. Pt tolerated interventions with increase weight and or reps. Tactile cue for scapular retraction needed with shoulder external rotation. No Reports of increase pain. Cue to prevent forward trunk flexion with standing shoulder extensions    Personal Factors and Comorbidities Age;Comorbidity 2    Comorbidities arthritis, HTN    Examination-Activity Limitations Bend;Carry;Lift    Examination-Participation Restrictions Meal Prep;Cleaning;Community Activity;Shop;Laundry    Stability/Clinical Decision Making Stable/Uncomplicated    Rehab Potential Good    PT Frequency 2x / week    PT Duration 6 weeks    PT Treatment/Interventions ADLs/Self Care Home Management;Cryotherapy;Electrical Stimulation;Therapeutic activities;Therapeutic exercise;Balance training;Neuromuscular re-education;Manual techniques;Patient/family education;Passive range of motion;Moist Heat;Iontophoresis 46m/ml Dexamethasone;Dry needling    PT Next Visit Plan . scap stab/postural strengthening,  manual/modalities as indicated             Patient will benefit from skilled therapeutic intervention in order to improve the following deficits and impairments:  Decreased range of motion, Decreased activity tolerance, Pain, Hypomobility, Impaired flexibility, Decreased strength, Increased muscle spasms, Impaired UE functional use, Postural dysfunction  Visit Diagnosis: Muscle weakness (generalized)  Localized edema  Pain in thoracic spine     Problem List Patient Active Problem List   Diagnosis Date Noted   OA (osteoarthritis) of knee 07/18/2019   Primary osteoarthritis of left knee 07/18/2019    RScot Jun8/25/2022, 2:27 PM  [place] [address]Phone: [phone]  Fax:  [phone] Name: Vanessa HORIUCHIMRN: [id number] of Birth: 514-Nov-1952

## 2020-12-03 ENCOUNTER — Ambulatory Visit: Payer: Medicare Other | Admitting: Physical Therapy

## 2020-12-03 ENCOUNTER — Other Ambulatory Visit: Payer: Self-pay

## 2020-12-03 ENCOUNTER — Encounter: Payer: Self-pay | Admitting: Physical Therapy

## 2020-12-03 DIAGNOSIS — R252 Cramp and spasm: Secondary | ICD-10-CM

## 2020-12-03 DIAGNOSIS — M6281 Muscle weakness (generalized): Secondary | ICD-10-CM

## 2020-12-03 DIAGNOSIS — M546 Pain in thoracic spine: Secondary | ICD-10-CM | POA: Diagnosis not present

## 2020-12-03 DIAGNOSIS — R600 Localized edema: Secondary | ICD-10-CM

## 2020-12-03 NOTE — Therapy (Signed)
[place]. [place], [place], [address] Phone: [phone]   Fax:  [phone]  Physical Therapy Treatment  Patient Details  Name: Vanessa Blair MRN: [id number] Date of Birth: [birth year] Referring Provider (PT): Rolena Infante   Encounter Date: 12/03/2020   PT End of Session - 12/03/20 1140     Visit Number 17    Date for PT Re-Evaluation 12/13/20    PT Start Time 1100    PT Stop Time 1143    PT Time Calculation (min) 43 min    Activity Tolerance Patient tolerated treatment well    Behavior During Therapy [facility] for tasks assessed/performed             Past Medical History:  Diagnosis Date   Anemia    Arthritis    Hypercholesteremia    Hypertension     Past Surgical History:  Procedure Laterality Date   BREAST SURGERY     TOTAL HIP ARTHROPLASTY     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   Subjective Assessment - 12/03/20 1105     Subjective "Ok"    Currently in Pain? Yes    Pain Score 3     Pain Location Back    Pain Orientation Upper                               [facility] Adult PT Treatment/Exercise - 12/03/20 0001       Neck Exercises: Machines for Strengthening   UBE (Upper Arm Bike) L2.3 x 2 min each    Nustep L5 x 6 min    Cybex Row 35# 2x10    Lat Pull 35# 2x10      Neck Exercises: Seated   Neck Retraction 20 reps;3 secs    Other Seated Exercise OHP blue ball 2x10      Shoulder Exercises: Seated   External Rotation Strengthening;Both;10 reps;Theraband    Theraband Level (Shoulder External Rotation) Level 3 (Green)    Other Seated Exercises Horizontal abd green 2x10      Shoulder Exercises: Standing   Extension Strengthening;20 reps;Weights;Both    Extension Weight (lbs) 5                       PT Short Term Goals - 11/05/20 1155       PT SHORT TERM GOAL #1   Title Pt will be independent with HEP    Time 2    Period Weeks    Status Achieved    Target Date 10/04/20               PT Long Term Goals - 11/26/20 1109       PT LONG TERM GOAL #1   Title Pt will report 50% reduction in thoracic pain    Status Achieved      PT LONG TERM GOAL #2   Title Pt will demo understanding of posture/body mechanics    Status Partially Met      PT LONG TERM GOAL #3   Title Pt will report ability to complete all ADLs with no increase in thoracic pain    Status Partially Met  Plan - 12/03/20 1141     Clinical Impression Statement Pt continues to do well overall but she does report upper back pain between shoulder blades. Pt report [place] at the location her spine starts to curve form scoliosis. She continues to need cues for upright posture performing shoulder extensions. Increase resistance tolerated with rows and lats.    Personal Factors and Comorbidities Age;Comorbidity 2    Comorbidities arthritis, HTN    Examination-Activity Limitations Bend;Carry;Lift    Examination-Participation Restrictions Meal Prep;Cleaning;Community Activity;Shop;Laundry    Stability/Clinical Decision Making Stable/Uncomplicated    Rehab Potential Good    PT Frequency 2x / week    PT Duration 6 weeks    PT Treatment/Interventions ADLs/Self Care Home Management;Cryotherapy;Electrical Stimulation;Therapeutic activities;Therapeutic exercise;Balance training;Neuromuscular re-education;Manual techniques;Patient/family education;Passive range of motion;Moist Heat;Iontophoresis 4mg /ml Dexamethasone;Dry needling    PT Next Visit Plan . scap stab/postural strengthening, manual/modalities as indicated             Patient will benefit from skilled therapeutic intervention in order to improve the following deficits and impairments:  Decreased range  of motion, Decreased activity tolerance, Pain, Hypomobility, Impaired flexibility, Decreased strength, Increased muscle spasms, Impaired UE functional use, Postural dysfunction  Visit Diagnosis: Localized edema  Muscle weakness (generalized)  Pain in thoracic spine  Cramp and spasm     Problem List Patient Active Problem List   Diagnosis Date Noted   OA (osteoarthritis) of knee 07/18/2019   Primary osteoarthritis of left knee 07/18/2019    Scot Jun 12/03/2020, 11:43 AM  [place]. [place], [place], [address] Phone: [phone]   Fax:  [phone]  Name: Vanessa Blair MRN: [id number] Date of Birth: [birth year]

## 2020-12-05 ENCOUNTER — Encounter: Payer: Self-pay | Admitting: Rehabilitative and Restorative Service Providers"

## 2020-12-05 ENCOUNTER — Other Ambulatory Visit: Payer: Self-pay

## 2020-12-05 ENCOUNTER — Ambulatory Visit: Payer: Medicare Other | Admitting: Rehabilitative and Restorative Service Providers"

## 2020-12-05 DIAGNOSIS — M25562 Pain in left knee: Secondary | ICD-10-CM

## 2020-12-05 DIAGNOSIS — M546 Pain in thoracic spine: Secondary | ICD-10-CM

## 2020-12-05 DIAGNOSIS — R252 Cramp and spasm: Secondary | ICD-10-CM

## 2020-12-05 DIAGNOSIS — M6281 Muscle weakness (generalized): Secondary | ICD-10-CM

## 2020-12-05 DIAGNOSIS — R293 Abnormal posture: Secondary | ICD-10-CM

## 2020-12-05 DIAGNOSIS — R600 Localized edema: Secondary | ICD-10-CM

## 2020-12-05 NOTE — Therapy (Signed)
[place]. [place], [place], [address] Phone: [phone]   Fax:  [phone]  Physical Therapy Treatment  Patient Details  Name: Vanessa Blair MRN: [id number] Date of Birth: [birth year] Referring Provider (PT): Rolena Infante   Encounter Date: 12/05/2020   PT End of Session - 12/05/20 1103     Visit Number 18    Date for PT Re-Evaluation 12/13/20    PT Start Time 1100    PT Stop Time 1140    PT Time Calculation (min) 40 min    Activity Tolerance Patient tolerated treatment well    Behavior During Therapy [facility] for tasks assessed/performed             Past Medical History:  Diagnosis Date   Anemia    Arthritis    Hypercholesteremia    Hypertension     Past Surgical History:  Procedure Laterality Date   BREAST SURGERY     TOTAL HIP ARTHROPLASTY     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   Subjective Assessment - 12/05/20 1102     Subjective Pt reports being okay    Patient Stated Goals get rid of the pain; feel better overall    Currently in Pain? Yes    Pain Score 4     Pain Location Back    Pain Orientation Upper    Pain Descriptors / Indicators Sore                               [facility] Adult PT Treatment/Exercise - 12/05/20 0001       Ambulation/Gait   Gait Comments Pt amb outside large back parking lot loop independently, some dyspnea with walking back to clinic when going up slight incline.      Neck Exercises: Machines for Strengthening   UBE (Upper Arm Bike) L2.3 x 2 min each    Nustep L5 x 6 min    Cybex Row 35# 2x10    Lat Pull 35# 2x10    Other Machines for Strengthening AR press 10# x10 B      Neck Exercises: Seated   Neck Retraction 20 reps;3 secs    Other Seated  Exercise OHP blue ball 2x10      Manual Therapy   Manual Therapy Soft tissue mobilization;Myofascial release    Manual therapy comments in sitting    Soft tissue mobilization Upper traps into rhomboids    Myofascial Release manual TP release to rhomboids and upper trap                      PT Short Term Goals - 11/05/20 1155       PT SHORT TERM GOAL #1   Title Pt will be independent with HEP    Time 2    Period Weeks    Status Achieved    Target Date 10/04/20               PT Long Term Goals - 12/05/20 1212       PT LONG TERM GOAL #2   Title Pt will demo understanding of posture/body mechanics    Status Partially Met      PT  LONG TERM GOAL #3   Title Pt will report ability to complete all ADLs with no increase in thoracic pain    Status Partially Met                   Plan - 12/05/20 1211     Clinical Impression Statement Ms Brick continues to do well with ther ex. At completion of session, pt reports decreased pain to 2/10.  She states decreased pain following ambulation outside and only required min cuing for improved posture with outside ambulation.  She continues to progress with goal related activities.    PT Treatment/Interventions ADLs/Self Care Home Management;Cryotherapy;Electrical Stimulation;Therapeutic activities;Therapeutic exercise;Balance training;Neuromuscular re-education;Manual techniques;Patient/family education;Passive range of motion;Moist Heat;Iontophoresis 4mg /ml Dexamethasone;Dry needling    PT Next Visit Plan . scap stab/postural strengthening, manual/modalities as indicated    Consulted and Agree with Plan of Care Patient             Patient will benefit from skilled therapeutic intervention in order to improve the following deficits and impairments:  Decreased range of motion, Decreased activity tolerance, Pain, Hypomobility, Impaired flexibility, Decreased strength, Increased muscle spasms, Impaired UE functional  use, Postural dysfunction  Visit Diagnosis: Pain in thoracic spine  Cramp and spasm  Abnormal posture  Muscle weakness (generalized)  Localized edema  Acute pain of left knee     Problem List Patient Active Problem List   Diagnosis Date Noted   OA (osteoarthritis) of knee 07/18/2019   Primary osteoarthritis of left knee 07/18/2019    Juel Burrow, PT, DPT 12/05/2020, 12:15 PM  [place]. [place], [place], [address] Phone: [phone]   Fax:  [phone]  Name: BRENDA SAMANO MRN: [id number] Date of Birth: 27-Oct-1950

## 2020-12-17 ENCOUNTER — Ambulatory Visit: Payer: Medicare Other | Attending: Orthopedic Surgery | Admitting: Physical Therapy

## 2020-12-17 ENCOUNTER — Other Ambulatory Visit: Payer: Self-pay

## 2020-12-17 ENCOUNTER — Encounter: Payer: Self-pay | Admitting: Physical Therapy

## 2020-12-17 DIAGNOSIS — M546 Pain in thoracic spine: Secondary | ICD-10-CM | POA: Diagnosis present

## 2020-12-17 DIAGNOSIS — M6281 Muscle weakness (generalized): Secondary | ICD-10-CM | POA: Diagnosis present

## 2020-12-17 DIAGNOSIS — R293 Abnormal posture: Secondary | ICD-10-CM | POA: Diagnosis present

## 2020-12-17 DIAGNOSIS — R252 Cramp and spasm: Secondary | ICD-10-CM | POA: Insufficient documentation

## 2020-12-17 DIAGNOSIS — M25562 Pain in left knee: Secondary | ICD-10-CM | POA: Insufficient documentation

## 2020-12-17 NOTE — Therapy (Signed)
[place]. [place], [place], [address] Phone: [phone]   Fax:  [phone]  Physical Therapy Treatment  Patient Details  Name: Vanessa Blair MRN: [id number] Date of Birth: 03-14-1951 Referring Provider (PT): Rolena Infante   Encounter Date: 12/17/2020   PT End of Session - 12/17/20 1228     Visit Number 19    Date for PT Re-Evaluation 12/13/20    PT Start Time 9244    PT Stop Time 1229    PT Time Calculation (min) 44 min    Activity Tolerance Patient tolerated treatment well    Behavior During Therapy [facility] for tasks assessed/performed             Past Medical History:  Diagnosis Date   Anemia    Arthritis    Hypercholesteremia    Hypertension     Past Surgical History:  Procedure Laterality Date   BREAST SURGERY     TOTAL HIP ARTHROPLASTY     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   Subjective Assessment - 12/17/20 1149     Subjective Some tightness in but upper shoulders    Currently in Pain? Yes    Pain Score 4     Pain Location Shoulder    Pain Orientation Right;Left;Upper                [facility] PT Assessment - 12/17/20 0001       AROM   Overall AROM Comments WFL      Strength   Overall Strength Comments BUE 4+/5; postural weakness                           [facility] Adult PT Treatment/Exercise - 12/17/20 0001       Neck Exercises: Machines for Strengthening   UBE (Upper Arm Bike) L2.3 x 2 min each    Cybex Row 35# 2x10      Neck Exercises: Seated   Other Seated Exercise OHP blue ball 2x10      Shoulder Exercises: Seated   External Rotation Strengthening;Both;10 reps;Theraband    Theraband Level (Shoulder External Rotation) Level 3 (Green)      Shoulder Exercises:  Standing   Horizontal ABduction Strengthening;Both;20 reps;Theraband    Theraband Level (Shoulder Horizontal ABduction) Level 3 (Green)    Extension Strengthening;20 reps;Weights;Both    Extension Weight (lbs) 5      Manual Therapy   Manual Therapy Soft tissue mobilization;Myofascial release    Manual therapy comments in sitting    Soft tissue mobilization Upper traps into rhomboids    Myofascial Release manual TP release to rhomboids and upper trap                       PT Short Term Goals - 11/05/20 1155       PT SHORT TERM GOAL #1   Title Pt will be independent with HEP    Time 2    Period Weeks    Status Achieved    Target Date 10/04/20               PT Long Term Goals - 12/17/20 1200       PT LONG TERM GOAL #  1   Title Pt will report 50% reduction in thoracic pain    Status Achieved      PT LONG TERM GOAL #2   Title Pt will demo understanding of posture/body mechanics    Status Partially Met      PT LONG TERM GOAL #3   Title Pt will report ability to complete all ADLs with no increase in thoracic pain    Status Partially Met      PT LONG TERM GOAL #4   Title Pt will demonstrate TUG without AD <13 sec    Status Achieved                   Plan - 12/17/20 1229     Clinical Impression Statement Pt enters clinic reporting increase tightness in both upper shoulders. She suspects that this comes form cleaning her house. Pt stated it usually takes her two day to clean but this particular time she did all in one day, requiring multiple rest breaks due to UE fatigue. Overall ROM remains well but continues to have some postural weakness. Pt did report an ease I mobility after completing the interventions. Positive response to MT.    Personal Factors and Comorbidities Age;Comorbidity 2    Comorbidities arthritis, HTN    Examination-Activity Limitations Bend;Carry;Lift    Examination-Participation Restrictions Meal Prep;Cleaning;Community  Activity;Shop;Laundry    Stability/Clinical Decision Making Stable/Uncomplicated    Rehab Potential Good    PT Frequency 2x / week    PT Duration 6 weeks    PT Treatment/Interventions ADLs/Self Care Home Management;Cryotherapy;Electrical Stimulation;Therapeutic activities;Therapeutic exercise;Balance training;Neuromuscular re-education;Manual techniques;Patient/family education;Passive range of motion;Moist Heat;Iontophoresis 4mg /ml Dexamethasone;Dry needling    PT Next Visit Plan . scap stab/postural strengthening, manual/modalities as indicated             Patient will benefit from skilled therapeutic intervention in order to improve the following deficits and impairments:  Decreased range of motion, Decreased activity tolerance, Pain, Hypomobility, Impaired flexibility, Decreased strength, Increased muscle spasms, Impaired UE functional use, Postural dysfunction  Visit Diagnosis: Cramp and spasm  Abnormal posture  Pain in thoracic spine  Muscle weakness (generalized)     Problem List Patient Active Problem List   Diagnosis Date Noted   OA (osteoarthritis) of knee 07/18/2019   Primary osteoarthritis of left knee 07/18/2019    Scot Jun, PTA 12/17/2020, 12:32 PM  [place]. [place], [place], [address] Phone: [phone]   Fax:  [phone]  Name: Vanessa Blair MRN: [id number] Date of Birth: Mar 07, 1951

## 2020-12-20 ENCOUNTER — Encounter: Payer: Self-pay | Admitting: Physical Therapy

## 2020-12-20 ENCOUNTER — Ambulatory Visit: Payer: Medicare Other | Admitting: Physical Therapy

## 2020-12-20 ENCOUNTER — Other Ambulatory Visit: Payer: Self-pay

## 2020-12-20 DIAGNOSIS — M6281 Muscle weakness (generalized): Secondary | ICD-10-CM

## 2020-12-20 DIAGNOSIS — R293 Abnormal posture: Secondary | ICD-10-CM

## 2020-12-20 DIAGNOSIS — R252 Cramp and spasm: Secondary | ICD-10-CM | POA: Diagnosis not present

## 2020-12-20 DIAGNOSIS — M546 Pain in thoracic spine: Secondary | ICD-10-CM

## 2020-12-20 NOTE — Therapy (Addendum)
[place]. [place], [place], [address] Phone: [phone]   Fax:  [phone] Progress Note Reporting Period 11/13/20 to 12/20/20 for visits 11-20   See note below for Objective Data and Assessment of Progress/Goals.     Physical Therapy Treatment  Patient Details  Name: Vanessa Blair MRN: [id number] Date of Birth: 10/27/1950 Referring Provider (PT): Rolena Infante   Encounter Date: 12/20/2020   PT End of Session - 12/20/20 1425     Visit Number 20    PT Start Time 5726    PT Stop Time 1425    PT Time Calculation (min) 40 min    Activity Tolerance Patient tolerated treatment well    Behavior During Therapy WFL for tasks assessed/performed             Past Medical History:  Diagnosis Date   Anemia    Arthritis    Hypercholesteremia    Hypertension     Past Surgical History:  Procedure Laterality Date   BREAST SURGERY     TOTAL HIP ARTHROPLASTY     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   Subjective Assessment - 12/20/20 1344     Subjective "I feel all right"    Currently in Pain? Yes    Pain Score 3     Pain Location Back                               [facility] Adult PT Treatment/Exercise - 12/20/20 0001       Neck Exercises: Machines for Strengthening   UBE (Upper Arm Bike) L2.4 x 2 min each    Nustep L5 x 6 min    Cybex Row 35# 2x10    Lat Pull 35# 2x10    Other Machines for Strengthening Lumbar Ext black band 2x10      Neck Exercises: Seated   Other Seated Exercise OHP blue ball 2x10    Other Seated Exercise Horiz Abs green 2x10      Shoulder Exercises: Standing   Extension Strengthening;20 reps;Both;Theraband    Theraband Level (Shoulder Extension) Level 3 (Green)    Row  Strengthening;Both;20 reps;Theraband    Theraband Level (Shoulder Row) Level 3 (Green)      Shoulder Exercises: Stretch   Other Shoulder Stretches Forward flex with bball 2x10                       PT Short Term Goals - 11/05/20 1155       PT SHORT TERM GOAL #1   Title Pt will be independent with HEP    Time 2    Period Weeks    Status Achieved    Target Date 10/04/20               PT Long Term Goals - 12/20/20 1425       PT LONG TERM GOAL #1   Title Pt will report 50% reduction in thoracic pain    Status Achieved      PT LONG TERM GOAL #2   Title Pt will demo understanding of posture/body mechanics    Status Partially Met      PT LONG TERM  GOAL #3   Title Pt will report ability to complete all ADLs with no increase in thoracic pain                   Plan - 12/20/20 1426     Clinical Impression Statement Pt has a forward head and rounded shoulders. She reports a decrease activity tolerance at home doing ADL. She continues to have a fluctuation in mid thoracic pain between her shoulder blades where her scoliosis starts. Tactile cue to prevent postural sway needed with seated rows. Cues needed for core engagement with shoulder extensions.    Personal Factors and Comorbidities Age;Comorbidity 2    Comorbidities arthritis, HTN    Examination-Activity Limitations Bend;Carry;Lift    Examination-Participation Restrictions Meal Prep;Cleaning;Community Activity;Shop;Laundry    Stability/Clinical Decision Making Stable/Uncomplicated    Rehab Potential Good    PT Frequency 2x / week    PT Next Visit Plan . scap stab/postural strengthening, manual/modalities as indicated             Patient will benefit from skilled therapeutic intervention in order to improve the following deficits and impairments:  Decreased range of motion, Decreased activity tolerance, Pain, Hypomobility, Impaired flexibility, Decreased strength, Increased muscle spasms,  Impaired UE functional use, Postural dysfunction  Visit Diagnosis: Abnormal posture  Pain in thoracic spine  Cramp and spasm  Muscle weakness (generalized)     Problem List Patient Active Problem List   Diagnosis Date Noted   OA (osteoarthritis) of knee 07/18/2019   Primary osteoarthritis of left knee 07/18/2019    Scot Jun, PTA 12/20/2020, 2:30 PM  [place]. [place], [place], [address] Phone: [phone]   Fax:  [phone]  Name: Vanessa Blair MRN: [id number] Date of Birth: [birth year]

## 2020-12-25 ENCOUNTER — Encounter: Payer: Medicare Other | Admitting: Physical Therapy

## 2020-12-26 ENCOUNTER — Encounter: Payer: Self-pay | Admitting: Rehabilitative and Restorative Service Providers"

## 2020-12-26 ENCOUNTER — Other Ambulatory Visit: Payer: Self-pay

## 2020-12-26 ENCOUNTER — Ambulatory Visit: Payer: Medicare Other | Admitting: Rehabilitative and Restorative Service Providers"

## 2020-12-26 DIAGNOSIS — R252 Cramp and spasm: Secondary | ICD-10-CM | POA: Diagnosis not present

## 2020-12-26 DIAGNOSIS — R293 Abnormal posture: Secondary | ICD-10-CM

## 2020-12-26 DIAGNOSIS — M546 Pain in thoracic spine: Secondary | ICD-10-CM

## 2020-12-26 DIAGNOSIS — M6281 Muscle weakness (generalized): Secondary | ICD-10-CM

## 2020-12-26 DIAGNOSIS — M25562 Pain in left knee: Secondary | ICD-10-CM

## 2020-12-26 NOTE — Therapy (Signed)
[place]. [place], [place], [address] Phone: [phone]   Fax:  [phone]  Physical Therapy Treatment  Patient Details  Name: Vanessa Blair MRN: [id number] Date of Birth: 1951-03-11 Referring Provider (PT): Rolena Infante   Encounter Date: 12/26/2020   PT End of Session - 12/26/20 1200     Visit Number 21    Date for PT Re-Evaluation 01/11/21    PT Start Time 2725    PT Stop Time 1225    PT Time Calculation (min) 40 min    Activity Tolerance Patient tolerated treatment well    Behavior During Therapy [facility] for tasks assessed/performed             Past Medical History:  Diagnosis Date   Anemia    Arthritis    Hypercholesteremia    Hypertension     Past Surgical History:  Procedure Laterality Date   BREAST SURGERY     TOTAL HIP ARTHROPLASTY     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   Subjective Assessment - 12/26/20 1159     Subjective Pt states that she is okay.    Patient Stated Goals get rid of the pain; feel better overall    Currently in Pain? Yes    Pain Score 3     Pain Location Back    Pain Orientation Upper    Pain Descriptors / Indicators Sore    Pain Type Chronic pain                [facility] PT Assessment - 12/26/20 0001       Assessment   Medical Diagnosis thoracic pain    Referring Provider (PT) Brooks    Hand Dominance Right    Next MD Visit as needed      Balance Screen   Has the patient fallen in the past 6 months No    Has the patient had a decrease in activity level because of a fear of falling?  No    Is the patient reluctant to leave their home because of a fear of falling?  No      Home Environment   Living Environment Private residence    Living Arrangements Alone     Type of [place] to enter    [place] One level      Prior Function   Level of [place] Retired    Leisure gardening      Observation/Other Assessments   Focus on Therapeutic Outcomes (FOTO)  52%      AROM   Overall AROM Comments WFL      Strength   Overall Strength Comments BUE 4+/5; postural weakness                           [facility] Adult PT Treatment/Exercise - 12/26/20 0001       Neck Exercises: Machines for Strengthening   UBE (Upper Arm Bike) L2.4 x 2 min each    Nustep L5 x 6 min    Cybex Row 35# 2x10    Lat Pull 35# 2x10    Other Machines for Strengthening Lumbar Ext black band 2x10  Neck Exercises: Seated   Other Seated Exercise OHP blue ball 2x10    Other Seated Exercise Horiz Abs green 2x10      Shoulder Exercises: Standing   Extension Strengthening;Both;20 reps    Extension Weight (lbs) 5                       PT Short Term Goals - 11/05/20 1155       PT SHORT TERM GOAL #1   Title Pt will be independent with HEP    Time 2    Period Weeks    Status Achieved    Target Date 10/04/20               PT Long Term Goals - 12/26/20 1234       PT LONG TERM GOAL #1   Title Pt will report 50% reduction in thoracic pain    Status Achieved      PT LONG TERM GOAL #2   Title Pt will demo understanding of posture/body mechanics    Status Partially Met      PT LONG TERM GOAL #3   Title Pt will report ability to complete all ADLs with no increase in thoracic pain    Status Partially Met      PT LONG TERM GOAL #4   Title Pt will demonstrate TUG without AD <13 sec    Status Achieved      PT LONG TERM GOAL #5   Title Pt will demonstrate <3cm difference in swelling between R and L knee    Status On-going                   Plan - 12/26/20 1227     Clinical Impression Statement Ms Magana is making progress towards goal related activities.  She has  improved on her FOTO score since it was last assessed and overall is having decreased pain.  She continues to progress with ther ex and does not have increased pain with activities. Pt continues to require cuing for posture and body mechanics and still have forward head posture with rounded shoulders. She has not yet met all functional goals and would benefit from continued skilled PT of 2x/wk for 4 weeks to progress towards goal related activities.    Personal Factors and Comorbidities Age;Comorbidity 2    Comorbidities arthritis, HTN    Examination-Activity Limitations Bend;Carry;Lift    Examination-Participation Restrictions Meal Prep;Cleaning;Community Activity;Shop;Laundry    Stability/Clinical Decision Making Stable/Uncomplicated    Clinical Decision Making Low    Rehab Potential Good    PT Frequency 2x / week    PT Duration 4 weeks    PT Treatment/Interventions ADLs/Self Care Home Management;Cryotherapy;Electrical Stimulation;Therapeutic activities;Therapeutic exercise;Balance training;Neuromuscular re-education;Manual techniques;Patient/family education;Passive range of motion;Moist Heat;Iontophoresis 4mg /ml Dexamethasone;Dry needling    PT Next Visit Plan . scap stab/postural strengthening, manual/modalities as indicated    Consulted and Agree with Plan of Care Patient             Patient will benefit from skilled therapeutic intervention in order to improve the following deficits and impairments:  Decreased range of motion, Decreased activity tolerance, Pain, Hypomobility, Impaired flexibility, Decreased strength, Increased muscle spasms, Impaired UE functional use, Postural dysfunction  Visit Diagnosis: Abnormal posture - Plan: PT plan of care cert/re-cert  Pain in thoracic spine - Plan: PT plan of care cert/re-cert  Cramp and spasm - Plan: PT plan of care cert/re-cert  Muscle weakness (generalized) - Plan: PT plan  of care cert/re-cert  Acute pain of left knee - Plan: PT  plan of care cert/re-cert     Problem List Patient Active Problem List   Diagnosis Date Noted   OA (osteoarthritis) of knee 07/18/2019   Primary osteoarthritis of left knee 07/18/2019    Juel Burrow, PT, DPT 12/26/2020, 12:38 PM  [place]. [place], [place], [address] Phone: [phone]   Fax:  [phone]  Name: LATRISE BOWLAND MRN: [id number] Date of Birth: [birth year]

## 2020-12-31 ENCOUNTER — Other Ambulatory Visit: Payer: Self-pay

## 2020-12-31 ENCOUNTER — Ambulatory Visit: Payer: Medicare Other | Admitting: Physical Therapy

## 2020-12-31 ENCOUNTER — Encounter: Payer: Self-pay | Admitting: Physical Therapy

## 2020-12-31 DIAGNOSIS — M546 Pain in thoracic spine: Secondary | ICD-10-CM

## 2020-12-31 DIAGNOSIS — R293 Abnormal posture: Secondary | ICD-10-CM

## 2020-12-31 DIAGNOSIS — R252 Cramp and spasm: Secondary | ICD-10-CM

## 2020-12-31 NOTE — Therapy (Signed)
[place] [place], [place], [address] Phone: [phone]   Fax:  [phone]  Physical Therapy Treatment  Patient Details  Name: Vanessa Blair MRN: [id number] Date of Birth: 1950/09/25 Referring Provider (PT): Rolena Infante   Encounter Date: 12/31/2020   PT End of Session - 12/31/20 1558     Visit Number 22    Date for PT Re-Evaluation 01/11/21    PT Start Time 1515    PT Stop Time 1559    PT Time Calculation (min) 44 min    Activity Tolerance Patient tolerated treatment well    Behavior During Therapy 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   Subjective Assessment - 12/31/20 1517     Subjective "A little tired today, been doing laundry and vacuuming"    Currently in Pain? Yes    Pain Score 3     Pain Orientation Upper                               [facility] Adult PT Treatment/Exercise - 12/31/20 0001       Neck Exercises: Machines for Strengthening   UBE (Upper Arm Bike) L2.4 x 2 min each    Nustep L5 x 6 min    Cybex Row 35# 2x10    Lat Pull 35# 2x10    Other Machines for Strengthening Lumbar Ext black band 2x10    Other Machines for Strengthening Chest press 10lb 2x10      Shoulder Exercises: Seated   External Rotation Strengthening;Both;10 reps;Theraband    Theraband Level (Shoulder External Rotation) Level 3 (Green)    Other Seated Exercises Horizontal abd red  2x15      Shoulder Exercises: Standing   Other Standing Exercises Tricep  Ext 20lb 2x10                       PT Short Term Goals - 11/05/20 1155       PT SHORT TERM GOAL #1   Title Pt will be independent with HEP    Time 2    Period Weeks    Status Achieved    Target Date 10/04/20               PT Long Term Goals - 12/26/20 1234       PT LONG TERM GOAL #1   Title Pt will report 50% reduction in thoracic pain    Status Achieved      PT LONG TERM GOAL #2   Title Pt will demo understanding of posture/body mechanics    Status Partially Met      PT LONG TERM GOAL #3   Title Pt will report ability to complete all ADLs with no increase in thoracic pain    Status Partially Met      PT LONG  TERM GOAL #4   Title Pt will demonstrate TUG without AD <13 sec    Status Achieved      PT LONG TERM GOAL #5   Title Pt will demonstrate <3cm difference in swelling between R and L knee    Status On-going                   Plan - 12/31/20 1559     Clinical Impression Statement Pt continues to progress overall with exercise interventions. She did required tactile cue to prevent posterior trunk leaning with sated rows. Cue given throughout for postural correction when standing at rest. Pt reports that she cold feel the muscle sin her back today with external rotation. Cue needed to keep arms down with triceps extensions.    Personal Factors and Comorbidities Age;Comorbidity 2    Comorbidities arthritis, HTN    Examination-Activity Limitations Bend;Carry;Lift    Examination-Participation Restrictions Meal Prep;Cleaning;Community Activity;Shop;Laundry    Stability/Clinical Decision Making Stable/Uncomplicated    Rehab Potential Good    PT Frequency 2x / week    PT Treatment/Interventions ADLs/Self Care Home Management;Cryotherapy;Electrical Stimulation;Therapeutic activities;Therapeutic exercise;Balance training;Neuromuscular re-education;Manual techniques;Patient/family education;Passive range of motion;Moist Heat;Iontophoresis 5m/ml  Dexamethasone;Dry needling    PT Next Visit Plan . scap stab/postural strengthening, manual/modalities as indicated             Patient will benefit from skilled therapeutic intervention in order to improve the following deficits and impairments:  Decreased range of motion, Decreased activity tolerance, Pain, Hypomobility, Impaired flexibility, Decreased strength, Increased muscle spasms, Impaired UE functional use, Postural dysfunction  Visit Diagnosis: Abnormal posture  Pain in thoracic spine  Cramp and spasm     Problem List Patient Active Problem List   Diagnosis Date Noted   OA (osteoarthritis) of knee 07/18/2019   Primary osteoarthritis of left knee 07/18/2019    RScot Jun PTA 12/31/2020, 4:08 PM  [place] [address]Phone: [phone]  Fax:  [phone] Name: Vanessa BERTAGNOLLIMRN: [id number] of Birth: [birth year]

## 2021-01-02 ENCOUNTER — Other Ambulatory Visit: Payer: Self-pay

## 2021-01-02 ENCOUNTER — Encounter: Payer: Self-pay | Admitting: Physical Therapy

## 2021-01-02 ENCOUNTER — Ambulatory Visit: Payer: Medicare Other | Admitting: Physical Therapy

## 2021-01-02 DIAGNOSIS — M6281 Muscle weakness (generalized): Secondary | ICD-10-CM

## 2021-01-02 DIAGNOSIS — M546 Pain in thoracic spine: Secondary | ICD-10-CM

## 2021-01-02 DIAGNOSIS — R252 Cramp and spasm: Secondary | ICD-10-CM

## 2021-01-02 DIAGNOSIS — R293 Abnormal posture: Secondary | ICD-10-CM

## 2021-01-02 NOTE — Therapy (Signed)
[facility] [address] [place], [place], [address] Phone: [phone]   Fax:  [phone]  Physical Therapy Treatment  Patient Details  Name: Vanessa Blair MRN: [id number] Date of Birth: Oct 07, 1950 Referring Provider (PT): Shon Baton   Encounter Date: 01/02/2021   PT End of Session - 01/02/21 1223     Visit Number 23    Date for PT Re-Evaluation 01/11/21    PT Start Time 1150    PT Stop Time 1230    PT Time Calculation (min) 40 min    Activity Tolerance Patient tolerated treatment well    Behavior During Therapy [facility] for tasks assessed/performed             Past Medical History:  Diagnosis Date   Anemia    Arthritis    Hypercholesteremia    Hypertension     Past Surgical History:  Procedure Laterality Date   BREAST SURGERY     TOTAL HIP ARTHROPLASTY     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   Subjective Assessment - 01/02/21 1153     Subjective "Im all right"    Currently in Pain? Yes    Pain Score 3     Pain Location Back    Pain Orientation Upper                               [facility] Adult PT Treatment/Exercise - 01/02/21 0001       Ambulation/Gait   Gait Comments Pt amb outside around back front island  independently, some dyspnea with walking back to clinic when going up slight incline.      Neck Exercises: Machines for Strengthening   UBE (Upper Arm Bike) L2.4 x 2 min each    Nustep L5 x 6 min      Neck Exercises: Standing   Other Standing Exercises Shoulder flex & Abd 2lb 2x10      Shoulder Exercises: Seated   Other Seated Exercises S2S w/ OHP yellow ball 2x10      Shoulder Exercises: Standing   Extension Strengthening;Both;20 reps    Extension Weight (lbs) 5    Row  Strengthening;Both;Weights;20 reps    Row Weight (lbs) 10                       PT Short Term Goals - 11/05/20 1155       PT SHORT TERM GOAL #1   Title Pt will be independent with HEP    Time 2    Period Weeks    Status Achieved    Target Date 10/04/20               PT Long Term Goals - 12/26/20 1234       PT LONG TERM GOAL #1   Title Pt will report 50% reduction in thoracic pain    Status Achieved      PT LONG TERM GOAL #2   Title Pt will demo understanding of posture/body mechanics    Status Partially Met      PT LONG TERM GOAL #3   Title Pt will report ability to complete all ADLs with no increase in thoracic pain  Status Partially Met      PT LONG TERM GOAL #4   Title Pt will demonstrate TUG without AD <13 sec    Status Achieved      PT LONG TERM GOAL #5   Title Pt will demonstrate <3cm difference in swelling between R and L knee    Status On-going                   Plan - 01/02/21 1225     Clinical Impression Statement Pt ~ 5 minutes late for today's session. Added more functional interventions today. Postural cue needed with outdoor ambulation and with sit to stands. Both interventions does cause pt to fatigue. Cue needed for core engagement with shoulder extensions and rows.    Personal Factors and Comorbidities Age;Comorbidity 2    Comorbidities arthritis, HTN    Examination-Activity Limitations Bend;Carry;Lift    Examination-Participation Restrictions Meal Prep;Cleaning;Community Activity;Shop;Laundry    Stability/Clinical Decision Making Stable/Uncomplicated    Rehab Potential Good    PT Frequency 2x / week    PT Duration 4 weeks    PT Treatment/Interventions ADLs/Self Care Home Management;Cryotherapy;Electrical Stimulation;Therapeutic activities;Therapeutic exercise;Balance training;Neuromuscular re-education;Manual techniques;Patient/family education;Passive range of motion;Moist Heat;Iontophoresis 4mg /ml Dexamethasone;Dry  needling    PT Next Visit Plan . scap stab/postural strengthening, manual/modalities as indicated             Patient will benefit from skilled therapeutic intervention in order to improve the following deficits and impairments:  Decreased range of motion, Decreased activity tolerance, Pain, Hypomobility, Impaired flexibility, Decreased strength, Increased muscle spasms, Impaired UE functional use, Postural dysfunction  Visit Diagnosis: Pain in thoracic spine  Muscle weakness (generalized)  Abnormal posture  Cramp and spasm     Problem List Patient Active Problem List   Diagnosis Date Noted   OA (osteoarthritis) of knee 07/18/2019   Primary osteoarthritis of left knee 07/18/2019    Scot Jun, PTA 01/02/2021, 12:30 PM  [place]. [place], [place], [address] Phone: [phone]   Fax:  [phone]  Name: Vanessa Blair MRN: [id number] Date of Birth: 1950/07/08

## 2021-01-07 ENCOUNTER — Encounter: Payer: Self-pay | Admitting: Physical Therapy

## 2021-01-07 ENCOUNTER — Other Ambulatory Visit: Payer: Self-pay

## 2021-01-07 ENCOUNTER — Ambulatory Visit: Payer: Medicare Other | Attending: Orthopedic Surgery | Admitting: Physical Therapy

## 2021-01-07 DIAGNOSIS — M546 Pain in thoracic spine: Secondary | ICD-10-CM | POA: Diagnosis present

## 2021-01-07 DIAGNOSIS — R252 Cramp and spasm: Secondary | ICD-10-CM | POA: Diagnosis present

## 2021-01-07 DIAGNOSIS — M6281 Muscle weakness (generalized): Secondary | ICD-10-CM | POA: Diagnosis present

## 2021-01-07 DIAGNOSIS — R293 Abnormal posture: Secondary | ICD-10-CM | POA: Diagnosis present

## 2021-01-07 NOTE — Therapy (Signed)
[place]. 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   Subjective Assessment - 01/07/21 1149     Subjective "All right"    Currently in Pain? Yes    Pain Score 3     Pain Location Back    Pain Orientation Upper                               [facility] Adult PT Treatment/Exercise - 01/07/21 0001       Neck Exercises: Machines for Strengthening   UBE (Upper Arm Bike) L1.5 x 2 min each    Cybex Row 35# 2x10    Lat Pull 35# 2x10    Other Machines for Strengthening Chest press 10lb x10      Neck Exercises: Standing   Other Standing Exercises Shoulder flex & Abd 2lb 2x10      Neck Exercises: Seated   Other Seated Exercise OHP blue ball 2x10    Other Seated Exercise Horiz Abs green 2x10      Shoulder Exercises: Seated   Other Seated Exercises S2S w/ yellow ball 2x10      Shoulder Exercises: Standing   Extension Strengthening;Both;20 reps    Extension Weight (lbs) 5    Row Strengthening;Both;Weights;20 reps    Row Weight (lbs) 10                       PT Short Term Goals - 11/05/20 1155       PT SHORT TERM GOAL #1   Title Pt will be independent with HEP    Time 2     Period Weeks    Status Achieved    Target Date 10/04/20               PT Long Term Goals - 12/26/20 1234       PT LONG TERM GOAL #1   Title Pt will report 50% reduction in thoracic pain    Status Achieved      PT LONG TERM GOAL #2   Title Pt will demo understanding of posture/body mechanics    Status Partially Met      PT LONG TERM GOAL #3   Title Pt will report ability to complete all ADLs with no increase in thoracic pain    Status Partially Met      PT LONG TERM GOAL #4   Title Pt will demonstrate TUG without AD <13 sec    Status Achieved      PT LONG TERM  GOAL #5   Title Pt will demonstrate <3cm difference in swelling between R and L knee    Status On-going                   Plan - 01/07/21 1231     Clinical Impression Statement PT ~ 5 minute late. Pt continues to have a low pain rating where her spine begins to curve due to scoliosis. Forward head and rounded shoulders remains. Session focused on posterior chains strengthening to aid with posture. No reports of pain throughout session. Postural cue needed with standing shoulder rows and extensions.    Personal Factors and Comorbidities Age;Comorbidity 2    Examination-Activity Limitations Bend;Carry;Lift    Examination-Participation Restrictions Meal Prep;Cleaning;Community Activity;Shop;Laundry    Stability/Clinical Decision Making Stable/Uncomplicated    Rehab Potential Good    PT Frequency 2x / week    PT Duration 4 weeks    PT Treatment/Interventions ADLs/Self Care Home Management;Cryotherapy;Electrical Stimulation;Therapeutic activities;Therapeutic exercise;Balance training;Neuromuscular re-education;Manual techniques;Patient/family education;Passive range of motion;Moist Heat;Iontophoresis 46m/ml Dexamethasone;Dry needling    PT Next Visit Plan . scap stab/postural strengthening, manual/modalities as indicated possibel DC             Patient will benefit from skilled therapeutic  intervention in order to improve the following deficits and impairments:  Decreased range of motion, Decreased activity tolerance, Pain, Hypomobility, Impaired flexibility, Decreased strength, Increased muscle spasms, Impaired UE functional use, Postural dysfunction  Visit Diagnosis: Muscle weakness (generalized)  Cramp and spasm  Abnormal posture  Pain in thoracic spine     Problem List Patient Active Problem List   Diagnosis Date Noted   OA (osteoarthritis) of knee 07/18/2019   Primary osteoarthritis of left knee 07/18/2019    RScot Jun PTA 01/07/2021, 12:34 PM  [place] [address]Phone: [phone]  Fax:  [phone] Name: Vanessa GOUPILMRN: [id number] of Birth: 5Feb 18, 1952

## 2021-01-09 ENCOUNTER — Encounter: Payer: Self-pay | Admitting: Physical Therapy

## 2021-01-09 ENCOUNTER — Other Ambulatory Visit: Payer: Self-pay

## 2021-01-09 ENCOUNTER — Ambulatory Visit: Payer: Medicare Other | Admitting: Physical Therapy

## 2021-01-09 DIAGNOSIS — R293 Abnormal posture: Secondary | ICD-10-CM

## 2021-01-09 DIAGNOSIS — R252 Cramp and spasm: Secondary | ICD-10-CM

## 2021-01-09 DIAGNOSIS — M6281 Muscle weakness (generalized): Secondary | ICD-10-CM

## 2021-01-09 DIAGNOSIS — M546 Pain in thoracic spine: Secondary | ICD-10-CM

## 2021-01-09 NOTE — Therapy (Signed)
[place]. [place], [place], [address] Phone: [phone]   Fax:  [phone]  Physical Therapy Treatment  Patient Details  Name: Vanessa Blair MRN: [id number] Date of Birth: 1950/07/05 Referring Provider (PT): Rolena Infante   Encounter Date: 01/09/2021   PT End of Session - 01/09/21 1103     Visit Number 25    Date for PT Re-Evaluation 01/11/21    PT Start Time 1015    PT Stop Time 1100    PT Time Calculation (min) 45 min    Activity Tolerance Patient tolerated treatment well    Behavior During Therapy [facility] for tasks assessed/performed             Past Medical History:  Diagnosis Date   Anemia    Arthritis    Hypercholesteremia    Hypertension     Past Surgical History:  Procedure Laterality Date   BREAST SURGERY     TOTAL HIP ARTHROPLASTY     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   Subjective Assessment - 01/09/21 1022     Subjective "I am doing ok"    Pertinent History R THA ~20 yrs ago, L TKA    Limitations Lifting;House hold activities    Currently in Pain? Yes    Pain Score 3     Pain Location Back                               [facility] Adult PT Treatment/Exercise - 01/09/21 0001       Neck Exercises: Machines for Strengthening   UBE (Upper Arm Bike) L2.1 x 2 min each    Nustep L5 x 4 min    Cybex Row 35# 2x10    Lat Pull 35# 2x10    Other Machines for Strengthening Chest press 5lb 2x10      Shoulder Exercises: Standing   Extension Strengthening;Both;20 reps    Extension Weight (lbs) 10    Row Strengthening;Both;Weights;20 reps    Row Weight (lbs) 15    Other Standing Exercises Tricep Ext 20lb 2x10                       PT Short Term Goals - 11/05/20  1155       PT SHORT TERM GOAL #1   Title Pt will be independent with HEP    Time 2    Period Weeks    Status Achieved    Target Date 10/04/20               PT Long Term Goals - 01/09/21 1104       PT LONG TERM GOAL #1   Title Pt will report 50% reduction in thoracic pain    Status Achieved      PT LONG TERM GOAL #2   Title Pt will demo understanding of posture/body mechanics    Status Achieved      PT LONG TERM GOAL #3   Title Pt will report ability to complete all ADLs with no increase in thoracic pain    Status Partially Met      PT LONG TERM GOAL #4   Title Pt will demonstrate  TUG without AD <13 sec    Status Achieved                   Plan - 01/09/21 1105     Clinical Impression Statement Pt has progressed meeting most goals. She reports no functional limitation at home and is pleased with her current functional status. She continues to have a little bit of pain between her shoulder blades were her scoliosis starts. Good strength and ROM throughout session. Tactile cue to maintain good posture with shoulder extensions.    Personal Factors and Comorbidities Age;Comorbidity 2    Comorbidities arthritis, HTN    Examination-Activity Limitations Bend;Carry;Lift    Examination-Participation Restrictions Meal Prep;Cleaning;Community Activity;Shop;Laundry    Stability/Clinical Decision Making Stable/Uncomplicated    Rehab Potential Good    PT Frequency 2x / week    PT Duration 4 weeks    PT Treatment/Interventions ADLs/Self Care Home Management;Cryotherapy;Electrical Stimulation;Therapeutic activities;Therapeutic exercise;Balance training;Neuromuscular re-education;Manual techniques;Patient/family education;Passive range of motion;Moist Heat;Iontophoresis 4mg /ml Dexamethasone;Dry needling    PT Next Visit Plan D/C PT             Patient will benefit from skilled therapeutic intervention in order to improve the following deficits and impairments:   Decreased range of motion, Decreased activity tolerance, Pain, Hypomobility, Impaired flexibility, Decreased strength, Increased muscle spasms, Impaired UE functional use, Postural dysfunction  Visit Diagnosis: Muscle weakness (generalized)  Abnormal posture  Pain in thoracic spine  Cramp and spasm     Problem List Patient Active Problem List   Diagnosis Date Noted   OA (osteoarthritis) of knee 07/18/2019   Primary osteoarthritis of left knee 07/18/2019   PHYSICAL THERAPY DISCHARGE SUMMARY  Visits from Start of Care: 25   Patient agrees to discharge. Patient goals were partially met. Patient is being discharged due to meeting the stated rehab goals.  Scot Jun, PTA 01/09/2021, 11:11 AM  [place]. [place], [place], [address] Phone: [phone]   Fax:  [phone]  Name: Vanessa Blair MRN: [id number] Date of Birth: 02/15/1951

## 2021-05-09 ENCOUNTER — Encounter: Payer: Self-pay | Admitting: Physical Therapy

## 2021-05-09 ENCOUNTER — Ambulatory Visit: Payer: Medicare Other | Attending: Orthopedic Surgery | Admitting: Physical Therapy

## 2021-05-09 ENCOUNTER — Other Ambulatory Visit: Payer: Self-pay

## 2021-05-09 DIAGNOSIS — R293 Abnormal posture: Secondary | ICD-10-CM | POA: Diagnosis present

## 2021-05-09 DIAGNOSIS — M6281 Muscle weakness (generalized): Secondary | ICD-10-CM | POA: Insufficient documentation

## 2021-05-09 DIAGNOSIS — M546 Pain in thoracic spine: Secondary | ICD-10-CM | POA: Diagnosis present

## 2021-05-09 DIAGNOSIS — R252 Cramp and spasm: Secondary | ICD-10-CM | POA: Insufficient documentation

## 2021-05-09 NOTE — Therapy (Signed)
[place]. [place], [place], [address] Phone: [phone]   Fax:  [phone]  Physical Therapy Evaluation  Patient Details  Name: Vanessa Blair MRN: [id number] Date of Birth: [birth year] Referring Provider (PT): Melina Schools   Encounter Date: 05/09/2021   PT End of Session - 05/09/21 1221     Visit Number 1    Number of Visits 9    Date for PT Re-Evaluation 06/06/21    Authorization Type UHC    Authorization Time Period 05/09/21 to 06/06/21    PT Start Time 1028   arrived late   PT Stop Time 1100    PT Time Calculation (min) 32 min    Activity Tolerance Patient tolerated treatment well    Behavior During Therapy [facility] for tasks assessed/performed             Past Medical History:  Diagnosis Date   Anemia    Arthritis    Hypercholesteremia    Hypertension     Past Surgical History:  Procedure Laterality Date   BREAST SURGERY     TOTAL HIP ARTHROPLASTY     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    Subjective Assessment - 05/09/21 1026     Subjective My back is still bothering me, I was here last year and discharged in October. My back never stopped hurting. Dr. Rolena Infante told me "PT needs to be careful with how they are doing pulldowns with you maybe the rotator cuff is getting irritated". I only did my home exercises sometimes after I was discharged.I do feel better when I come to therapy.    Patient Stated Goals get my back feeling better    Currently in Pain? Yes    Pain Score 7    not consistent with behaviors and nonverbal communication   Pain Location Back    Pain Orientation Upper;Left    Pain Descriptors / Indicators Nagging;Radiating    Pain Type Chronic pain    Pain Radiating Towards L UE down  to elbow    Pain Onset Other (comment)   years   Pain Frequency Constant    Aggravating Factors  heavy weights during exercises    Pain Relieving Factors therapy, exercise, relax, stretching back    Effect of Pain on Daily Activities very vague "it bothers me when I lift heavy objects"                [place] PT Assessment - 05/09/21 0001       Assessment   Medical Diagnosis scoliosis    Referring Provider (PT) Dahari Rolena Infante    Onset Date/Surgical Date --   chronic   Next MD Visit Rolena Infante PRN    Prior Therapy PT here last year for similar problem      Precautions   Precautions None      Restrictions   Weight Bearing Restrictions No      Balance Screen   Has the patient fallen in the past 6 months No    Has the patient had a decrease in activity level because of a fear of falling?  No    Is the patient reluctant to leave their home because of a fear of falling?  No  Home Environment   Living Environment Private residence      Prior Function   Level of Independence Independent;Independent with basic ADLs    Vocation Retired    Leisure shopping, going out to eat      Observation/Other Assessments   Focus on Therapeutic Outcomes (FOTO)  did not have time to complete at eval- pt late      Posture/Postural Control   Posture/Postural Control Postural limitations    Postural Limitations Rounded Shoulders;Forward head;Increased thoracic kyphosis    Posture Comments forward head with small dowagers hump around c7, flat lumbar spine; thoracic convex R, lumbar convex L scoliotic curve      ROM / Strength   AROM / PROM / Strength AROM;Strength      AROM   AROM Assessment Site Thoracic;Cervical;Shoulder    Right/Left Shoulder Right;Left    Right Shoulder Flexion --   WNL   Right Shoulder ABduction --   WNL   Right Shoulder Internal Rotation --   FIR T6   Right Shoulder External Rotation --   FER T4   Left Shoulder Flexion --   WNL   Left Shoulder ABduction --   WNL    Left Shoulder Internal Rotation --   FIR T6   Left Shoulder External Rotation --   FER T4   Cervical Flexion WNL    Cervical Extension WNL    Cervical - Right Side Bend moderate limitation    Cervical - Left Side Bend moderate limitation    Cervical - Right Rotation moderate limitation    Cervical - Left Rotation moderate limitation    Thoracic Flexion mild limitation    Thoracic Extension moderate limitation    Thoracic - Right Side Bend moderate limitation    Thoracic - Left Side Bend moderate limitation    Thoracic - Right Rotation moderate limitation    Thoracic - Left Rotation moderate limitation      Strength   Overall Strength Comments grip strength seems a bit weaker L; able to do finger opposition no problem    Strength Assessment Site Shoulder;Elbow;Wrist    Right/Left Shoulder Right;Left    Right Shoulder Flexion 4/5    Right Shoulder Extension 3+/5    Right Shoulder ABduction 4/5    Right Shoulder Internal Rotation 4+/5    Right Shoulder External Rotation 3+/5    Left Shoulder Flexion 3+/5    Left Shoulder Extension 4-/5    Left Shoulder ABduction 3+/5    Left Shoulder Internal Rotation 5/5    Left Shoulder External Rotation 3+/5    Right/Left Elbow Right;Left    Right Elbow Flexion 5/5    Right Elbow Extension 3/5    Left Elbow Flexion 5/5    Left Elbow Extension 3/5    Right/Left Wrist Right;Left      Palpation   Palpation comment B upper traps very tight but not sure, same with cervical paraspinals; does have some TTP in B thoracic paraspinals and some trigger points                        Objective measurements completed on examination: See above findings.                PT Education - 05/09/21 1216     Education Details exam findings, POC, HEP; importance of [place] helping her to find some sort of activity/exercise that she enjoys and can do well long term to prevent pain excerbations  Person(s) Educated Patient    Methods  Explanation;Handout;Demonstration    Comprehension Verbalized understanding;Returned demonstration              PT Short Term Goals - 05/09/21 1235       PT SHORT TERM GOAL #1   Title Will be independent with appropriate progressive HEP    Time 2    Period Weeks    Status New    Target Date 05/23/21      PT SHORT TERM GOAL #2   Title Thoracic and cervical mobility to have improved by 50% in order to reduce pain and improve posture    Time 2    Period Weeks    Status New      PT SHORT TERM GOAL #3   Title Will be able to maintain more upright posture at least 50% of the time in order to improve mechanics and reduce pain    Time 2    Period Weeks    Status New               PT Long Term Goals - 05/09/21 1237       PT LONG TERM GOAL #1   Title MMT to improve by at least 1 grade in all weak groups to reduce pain and improve function    Time 4    Period Weeks    Status New    Target Date 06/06/21      PT LONG TERM GOAL #2   Title Will report pain no worse than 3/10 in order to improve QOL    Time 4    Period Weeks    Status New      PT LONG TERM GOAL #3   Title Will be compliant with independent gym or group exercise based program in order to maintain functional gains and prevent recurrence of severe pain    Time 4    Period Weeks    Status New                    Plan - 05/09/21 1223     Clinical Impression Statement Ms. Koker arrives today doing OK; very vague in her symptoms and in the history of her problem. Per chart review and discussion with prior charting clinicians, sounds like her current symptoms are very similar to course of care before. Examination reveals significant postural impairments, limited cervical and thoracic mobility, impaired functional strength, and general deconditioning. Does not sound like shes kept up with prior HEP, I gave her a new starter set of exercises, we will progress this as she continues to come to  therapy. Will progress as able in hopes of [place] to independent exercise program at end of course of care.    Personal Factors and Comorbidities Age;Fitness;Behavior Pattern;Past/Current Experience;Comorbidity 2;Time since onset of injury/illness/exacerbation;Social Background    Examination-Activity Limitations Bathing;Reach Overhead;Locomotion Level;Transfers;Bend;Sit;Carry;Squat;Stairs;Dressing;Stand;Lift    Examination-Participation Restrictions Cleaning;Occupation;Community Activity;Shop;Medication Management    Stability/Clinical Decision Making Stable/Uncomplicated    Clinical Decision Making Low    Rehab Potential Fair    PT Frequency 2x / week    PT Duration 4 weeks    PT Treatment/Interventions ADLs/Self Care Home Management;Cryotherapy;Electrical Stimulation;Iontophoresis 4mg /ml Dexamethasone;Moist Heat;Ultrasound;Gait training;Stair training;Functional mobility training;Therapeutic activities;Therapeutic exercise;Balance training;Neuromuscular re-education;Patient/family education;Manual techniques;Passive range of motion;Dry needling;Energy conservation;Taping;Spinal Manipulations    PT Next Visit Plan needs to to FOTO (did not have time at eval); general function strengthening and postural training, cervial and thoracic ROM as tolerated  PT Home Exercise Plan [email or web address]    Consulted and Agree with Plan of Care Patient             Patient will benefit from skilled therapeutic intervention in order to improve the following deficits and impairments:  Decreased range of motion, Increased muscle spasms, Impaired UE functional use, Decreased activity tolerance, Impaired perceived functional ability, Pain, Hypomobility, Impaired flexibility, Improper body mechanics, Decreased mobility, Decreased strength, Postural dysfunction  Visit Diagnosis: Muscle weakness (generalized)  Abnormal posture  Pain in thoracic spine  Cramp and spasm     Problem List Patient Active Problem List    Diagnosis Date Noted   OA (osteoarthritis) of knee 07/18/2019   Primary osteoarthritis of left knee 07/18/2019   Ann Lions PT, DPT, PN2   Supplemental Physical Therapist [place]. [place], [place], [address] Phone: [phone]   Fax:  [phone]  Name: Vanessa Blair MRN: [id number] Date of Birth: 12-01-1950

## 2021-05-15 ENCOUNTER — Ambulatory Visit: Payer: Medicare Other | Admitting: Physical Therapy

## 2021-05-21 ENCOUNTER — Other Ambulatory Visit: Payer: Self-pay

## 2021-05-21 ENCOUNTER — Ambulatory Visit: Payer: Medicare Other | Admitting: Physical Therapy

## 2021-05-21 ENCOUNTER — Encounter: Payer: Self-pay | Admitting: Physical Therapy

## 2021-05-21 DIAGNOSIS — R293 Abnormal posture: Secondary | ICD-10-CM

## 2021-05-21 DIAGNOSIS — M6281 Muscle weakness (generalized): Secondary | ICD-10-CM

## 2021-05-21 DIAGNOSIS — M546 Pain in thoracic spine: Secondary | ICD-10-CM

## 2021-05-21 NOTE — Therapy (Signed)
[facility] [address] [place], [place], [address] Phone: [phone]   Fax:  [phone]  Physical Therapy Treatment  Patient Details  Name: Vanessa Blair MRN: [id number] Date of Birth: Sep 30, 1950 Referring Provider (PT): Venita Lick   Encounter Date: 05/21/2021   PT End of Session - 05/21/21 1053     Visit Number 2    Date for PT Re-Evaluation 06/06/21    Authorization Time Period 05/09/21 to 06/06/21    PT Start Time 1015    PT Stop Time 1053    PT Time Calculation (min) 38 min             Past Medical History:  Diagnosis Date   Anemia    Arthritis    Hypercholesteremia    Hypertension     Past Surgical History:  Procedure Laterality Date   BREAST SURGERY     TOTAL HIP ARTHROPLASTY     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   Subjective Assessment - 05/21/21 1019     Subjective Doing ok, a little stiff    Currently in Pain? Yes    Pain Score 7     Pain Location Back                               [facility] Adult PT Treatment/Exercise - 05/21/21 0001       Exercises   Exercises Lumbar;Shoulder      Lumbar Exercises: Aerobic   UBE (Upper Arm Bike) L2 x2 min each    Nustep L5 x 6 min      Lumbar Exercises: Standing   Row Strengthening;20 reps;Theraband    Theraband Level (Row) Level 3 (Green)    Shoulder Extension Strengthening;Both;20 reps;Theraband    Theraband Level (Shoulder Extension) Level 3 (Green)    Other Standing Lumbar Exercises Shrugs 3lb 2x10    Other Standing Lumbar Exercises ER red 2x10      Lumbar Exercises: Seated   Other Seated Lumbar Exercises horiz Abd yellow 2x10      Shoulder Exercises: Standing   Other Standing Exercises Shoulder Flex, Ext,      Manual  Therapy   Manual Therapy Soft tissue mobilization    Soft tissue mobilization Rhomboid area and UT                       PT Short Term Goals - 05/09/21 1235       PT SHORT TERM GOAL #1   Title Will be independent with appropriate progressive HEP    Time 2    Period Weeks    Status New    Target Date 05/23/21      PT SHORT TERM GOAL #2   Title Thoracic and cervical mobility to have improved by 50% in order to reduce pain and improve posture    Time 2    Period Weeks    Status New      PT SHORT TERM GOAL #3   Title Will be able to maintain more upright posture at least 50% of the time in order to improve mechanics and reduce pain    Time 2    Period Weeks  Status New               PT Long Term Goals - 05/09/21 1237       PT LONG TERM GOAL #1   Title MMT to improve by at least 1 grade in all weak groups to reduce pain and improve function    Time 4    Period Weeks    Status New    Target Date 06/06/21      PT LONG TERM GOAL #2   Title Will report pain no worse than 3/10 in order to improve QOL    Time 4    Period Weeks    Status New      PT LONG TERM GOAL #3   Title Will be compliant with independent gym or group exercise based program in order to maintain functional gains and prevent recurrence of severe pain    Time 4    Period Weeks    Status New                   Plan - 05/21/21 1054     Clinical Impression Statement Pt enters clinic reporting some stiffness. Some soreness in the rhomboid area that was improved with MT. Tactile cue to elbows needed with shoulder ER for arm placement. Cue for core engagement needed with standing shoulder extensions. Improved symptoms post treatment.    Personal Factors and Comorbidities Age;Fitness;Behavior Pattern;Past/Current Experience;Comorbidity 2;Time since onset of injury/illness/exacerbation;Social Background    Examination-Activity Limitations Bathing;Reach Overhead;Locomotion  Level;Transfers;Bend;Sit;Carry;Squat;Stairs;Dressing;Stand;Lift    Examination-Participation Restrictions Cleaning;Occupation;Community Activity;Shop;Medication Management    Clinical Decision Making Low    Rehab Potential Fair    PT Frequency 2x / week    PT Treatment/Interventions ADLs/Self Care Home Management;Cryotherapy;Electrical Stimulation;Iontophoresis 4mg /ml Dexamethasone;Moist Heat;Ultrasound;Gait training;Stair training;Functional mobility training;Therapeutic activities;Therapeutic exercise;Balance training;Neuromuscular re-education;Patient/family education;Manual techniques;Passive range of motion;Dry needling;Energy conservation;Taping;Spinal Manipulations    PT Next Visit Plan general function strengthening and postural training, cervial and thoracic ROM as tolerated             Patient will benefit from skilled therapeutic intervention in order to improve the following deficits and impairments:  Decreased range of motion, Increased muscle spasms, Impaired UE functional use, Decreased activity tolerance, Impaired perceived functional ability, Pain, Hypomobility, Impaired flexibility, Improper body mechanics, Decreased mobility, Decreased strength, Postural dysfunction  Visit Diagnosis: Muscle weakness (generalized)  Abnormal posture  Pain in thoracic spine     Problem List Patient Active Problem List   Diagnosis Date Noted   OA (osteoarthritis) of knee 07/18/2019   Primary osteoarthritis of left knee 07/18/2019    Scot Jun, PTA 05/21/2021, 10:56 AM  [place]. [place], [place], [address] Phone: [phone]   Fax:  [phone]  Name: Vanessa Blair MRN: [id number] Date of Birth: Sep 21, 1950

## 2021-05-23 ENCOUNTER — Other Ambulatory Visit: Payer: Self-pay

## 2021-05-23 ENCOUNTER — Ambulatory Visit: Payer: Medicare Other | Admitting: Physical Therapy

## 2021-05-23 DIAGNOSIS — M546 Pain in thoracic spine: Secondary | ICD-10-CM

## 2021-05-23 DIAGNOSIS — R293 Abnormal posture: Secondary | ICD-10-CM

## 2021-05-23 DIAGNOSIS — M6281 Muscle weakness (generalized): Secondary | ICD-10-CM | POA: Diagnosis not present

## 2021-05-23 NOTE — Therapy (Signed)
[place]. [place], [place], [address] Phone: [phone]   Fax:  [phone]  Physical Therapy Treatment  Patient Details  Name: Vanessa Blair MRN: [id number] Date of Birth: 08-12-1950 Referring Provider (PT): Melina Schools   Encounter Date: 05/23/2021   PT End of Session - 05/23/21 0952     Visit Number 3    Number of Visits 9    Date for PT Re-Evaluation 06/06/21    Authorization Type UHC    Authorization Time Period 05/09/21 to 06/06/21    PT Start Time 0930    PT Stop Time 1015    PT Time Calculation (min) 45 min             Past Medical History:  Diagnosis Date   Anemia    Arthritis    Hypercholesteremia    Hypertension     Past Surgical History:  Procedure Laterality Date   BREAST SURGERY     TOTAL HIP ARTHROPLASTY     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   Subjective Assessment - 05/23/21 0930     Subjective doing okay, pain in UT/shld    Currently in Pain? Yes    Pain Score 6     Pain Location Thoracic                               [facility] Adult PT Treatment/Exercise - 05/23/21 0001       Lumbar Exercises: Aerobic   UBE (Upper Arm Bike) L2 x2 min each way    Nustep L5 x 6 min      Lumbar Exercises: Standing   Other Standing Lumbar Exercises ball vs wall 5 x CW and CCW   postural wall angels 10 x. 3# shrugs, backward circles and ext 15 x each   Other Standing Lumbar Exercises cerv retraction with head on ball 10 x then 2# free wt UEex with head on ball   chest press, abd, PNF and row     Shoulder Exercises: Standing   External Rotation Strengthening;Both;20 reps    Theraband Level (Shoulder External Rotation) Level 2 (Red)    Extension Strengthening;Both;20  reps    Theraband Level (Shoulder Extension) Level 3 (Green)    Row Strengthening;Both;20 reps    Theraband Level (Shoulder Row) Level 3 (Green)      Manual Therapy   Manual Therapy Soft tissue mobilization    Soft tissue mobilization Rhomboid area and UT                       PT Short Term Goals - 05/23/21 0953       PT SHORT TERM GOAL #1   Title Will be independent with appropriate progressive HEP    Status Achieved               PT Long Term Goals - 05/09/21 1237       PT LONG TERM GOAL #1   Title MMT to improve by at least 1 grade in all weak groups to reduce pain and improve function    Time 4    Period Weeks    Status New    Target  Date 06/06/21   °  ° PT LONG TERM GOAL #2  ° Title Will report pain no worse than 3/10 in order to improve QOL   ° Time 4   ° Period Weeks   ° Status New   °  ° PT LONG TERM GOAL #3  ° Title Will be compliant with independent gym or group exercise based program in order to maintain functional gains and prevent recurrence of severe pain   ° Time 4   ° Period Weeks   ° Status New   ° °  °  ° °  ° ° ° ° ° ° ° ° Plan - 05/23/21 0953   ° ° Clinical Impression Statement STG #1 met. progressed postural ex with cuing - verb and tactile. stw to tightness and TP in cspine , fwd head with cerv bump noted   ° PT Treatment/Interventions ADLs/Self Care Home Management;Cryotherapy;Electrical Stimulation;Iontophoresis 4mg/ml Dexamethasone;Moist Heat;Ultrasound;Gait training;Stair training;Functional mobility training;Therapeutic activities;Therapeutic exercise;Balance training;Neuromuscular re-education;Patient/family education;Manual techniques;Passive range of motion;Dry needling;Energy conservation;Taping;Spinal Manipulations   ° PT Next Visit Plan general function strengthening and postural training, cervial and thoracic ROM as tolerated   ° °  °  ° °  ° ° °Patient will benefit from skilled therapeutic intervention in order to improve the following  deficits and impairments:  Decreased range of motion, Increased muscle spasms, Impaired UE functional use, Decreased activity tolerance, Impaired perceived functional ability, Pain, Hypomobility, Impaired flexibility, Improper body mechanics, Decreased mobility, Decreased strength, Postural dysfunction ° °Visit Diagnosis: °Muscle weakness (generalized) ° °Abnormal posture ° °Pain in thoracic spine ° ° ° ° °Problem List °Patient Active Problem List  ° Diagnosis Date Noted  ° OA (osteoarthritis) of knee 07/18/2019  ° Primary osteoarthritis of left knee 07/18/2019  ° ° °PAYSEUR,ANGIE, PTA °05/23/2021, 9:55 AM ° °[facility] °[address] °[place], [place], [address] °Phone: [phone]   Fax:  [phone] ° °Name: Vanessa Blair °MRN: [id number] °Date of Birth: 08/01/1950 ° ° ° °

## 2021-05-28 ENCOUNTER — Other Ambulatory Visit: Payer: Self-pay

## 2021-05-28 ENCOUNTER — Encounter: Payer: Self-pay | Admitting: Physical Therapy

## 2021-05-28 ENCOUNTER — Ambulatory Visit: Payer: Medicare Other | Admitting: Physical Therapy

## 2021-05-28 DIAGNOSIS — M6281 Muscle weakness (generalized): Secondary | ICD-10-CM | POA: Diagnosis not present

## 2021-05-28 DIAGNOSIS — R252 Cramp and spasm: Secondary | ICD-10-CM

## 2021-05-28 DIAGNOSIS — R293 Abnormal posture: Secondary | ICD-10-CM

## 2021-05-28 DIAGNOSIS — M546 Pain in thoracic spine: Secondary | ICD-10-CM

## 2021-05-28 NOTE — Therapy (Signed)
[facility]. [place], [place], [address] Phone: [phone]   Fax:  [phone]  Physical Therapy Treatment  Patient Details  Name: Vanessa Blair MRN: [id number] Date of Birth: October 24, 1950 Referring Provider (PT): Melina Schools   Encounter Date: 05/28/2021   PT End of Session - 05/28/21 1140     Visit Number 4    Date for PT Re-Evaluation 06/06/21    Authorization Time Period 05/09/21 to 06/06/21    PT Start Time 1100    PT Stop Time 1141    PT Time Calculation (min) 41 min    Activity Tolerance Patient tolerated treatment well    Behavior During Therapy [facility] for tasks assessed/performed             Past Medical History:  Diagnosis Date   Anemia    Arthritis    Hypercholesteremia    Hypertension     Past Surgical History:  Procedure Laterality Date   BREAST SURGERY     TOTAL HIP ARTHROPLASTY     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   Subjective Assessment - 05/28/21 1102     Subjective Doing pretty good, not really having any pain    Currently in Pain? No/denies                               [facility] Adult PT Treatment/Exercise - 05/28/21 0001       Lumbar Exercises: Aerobic   UBE (Upper Arm Bike) L2 x3 min each way    Nustep L5 x 62min      Lumbar Exercises: Machines for Strengthening   Other Lumbar Machine Exercise Rows 20lb Lats 15lb 2x10      Lumbar Exercises: Standing   Other Standing Lumbar Exercises cerv retraction with head on ball 2x10      Lumbar Exercises: Seated   Other Seated Lumbar Exercises horiz Abd  red 2x10    Other Seated Lumbar Exercises Er Red 2x10      Manual Therapy   Manual Therapy Soft tissue mobilization    Soft tissue mobilization Rhomboid area  and UT                       PT Short Term Goals - 05/23/21 0953       PT SHORT TERM GOAL #1   Title Will be independent with appropriate progressive HEP    Status Achieved               PT Long Term Goals - 05/09/21 1237       PT LONG TERM GOAL #1   Title MMT to improve by at least 1 grade in all weak groups to reduce pain and improve function    Time 4    Period Weeks    Status New    Target Date 06/06/21      PT LONG TERM GOAL #2   Title Will report pain no worse than 3/10 in order to improve QOL    Time 4    Period Weeks    Status New      PT LONG TERM GOAL #3   Title Will be compliant with  independent gym or group exercise based program in order to maintain functional gains and prevent recurrence of severe pain    Time 4    Period Weeks    Status New                   Plan - 05/28/21 1141     Clinical Impression Statement Decrease pain upon entering clinic. Some tissue density noted in R  UT rhomboid area. Postural cue required with the interventions. Cues not to extend shoulder with ER.    Personal Factors and Comorbidities Age;Fitness;Behavior Pattern;Past/Current Experience;Comorbidity 2;Time since onset of injury/illness/exacerbation;Social Background    Examination-Activity Limitations Bathing;Reach Overhead;Locomotion Level;Transfers;Bend;Sit;Carry;Squat;Stairs;Dressing;Stand;Lift    Examination-Participation Restrictions Cleaning;Occupation;Community Activity;Shop;Medication Management    Stability/Clinical Decision Making Stable/Uncomplicated    Rehab Potential Fair    PT Frequency 2x / week    PT Duration 4 weeks    PT Treatment/Interventions ADLs/Self Care Home Management;Cryotherapy;Electrical Stimulation;Iontophoresis 4mg /ml Dexamethasone;Moist Heat;Ultrasound;Gait training;Stair training;Functional mobility training;Therapeutic activities;Therapeutic exercise;Balance training;Neuromuscular re-education;Patient/family  education;Manual techniques;Passive range of motion;Dry needling;Energy conservation;Taping;Spinal Manipulations    PT Next Visit Plan general function strengthening and postural training, cervial and thoracic ROM as tolerated             Patient will benefit from skilled therapeutic intervention in order to improve the following deficits and impairments:  Decreased range of motion, Increased muscle spasms, Impaired UE functional use, Decreased activity tolerance, Impaired perceived functional ability, Pain, Hypomobility, Impaired flexibility, Improper body mechanics, Decreased mobility, Decreased strength, Postural dysfunction  Visit Diagnosis: Muscle weakness (generalized)  Pain in thoracic spine  Cramp and spasm  Abnormal posture     Problem List Patient Active Problem List   Diagnosis Date Noted   OA (osteoarthritis) of knee 07/18/2019   Primary osteoarthritis of left knee 07/18/2019    Scot Jun, PTA 05/28/2021, 11:43 AM  [place]. [place], [place], [address] Phone: [phone]   Fax:  [phone]  Name: Vanessa Blair MRN: [id number] Date of Birth: 1950/05/28

## 2021-05-30 ENCOUNTER — Ambulatory Visit: Payer: Medicare Other | Admitting: Physical Therapy

## 2021-05-30 ENCOUNTER — Other Ambulatory Visit: Payer: Self-pay

## 2021-05-30 ENCOUNTER — Encounter: Payer: Self-pay | Admitting: Physical Therapy

## 2021-05-30 DIAGNOSIS — M6281 Muscle weakness (generalized): Secondary | ICD-10-CM

## 2021-05-30 DIAGNOSIS — M546 Pain in thoracic spine: Secondary | ICD-10-CM

## 2021-05-30 NOTE — Therapy (Signed)
[address]. [place], [place], [address] Phone: [phone]   Fax:  [phone]  Physical Therapy Treatment  Patient Details  Name: Vanessa Blair MRN: [id number] Date of Birth: April 03, 1951 Referring Provider (PT): Melina Schools   Encounter Date: 05/30/2021   PT End of Session - 05/30/21 1222     Visit Number 5    Date for PT Re-Evaluation 06/06/21    Authorization Time Period 05/09/21 to 06/06/21    PT Start Time 0174    PT Stop Time 1225    PT Time Calculation (min) 40 min    Activity Tolerance Patient tolerated treatment well    Behavior During Therapy [facility] for tasks assessed/performed             Past Medical History:  Diagnosis Date   Anemia    Arthritis    Hypercholesteremia    Hypertension     Past Surgical History:  Procedure Laterality Date   BREAST SURGERY     TOTAL HIP ARTHROPLASTY     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   Subjective Assessment - 05/30/21 1147     Subjective "OK" a little sore at the base of her neck    Currently in Pain? No/denies                               [facility] Adult PT Treatment/Exercise - 05/30/21 0001       Lumbar Exercises: Aerobic   Nustep L5 x 6 min      Lumbar Exercises: Machines for Strengthening   Other Lumbar Machine Exercise Rows 20lb Lats 15lb 2x10      Lumbar Exercises: Standing   Shoulder Extension Strengthening;Both;20 reps;Power Engineering geologist Limitations 5    Other Standing Lumbar Exercises cerv retraction with head on ball 2x10      Lumbar Exercises: Seated   Other Seated Lumbar Exercises horiz Abd  red 2x10    Other Seated Lumbar Exercises ER Red 2x10      Manual Therapy   Manual Therapy Soft tissue  mobilization    Soft tissue mobilization Rhomboid area and UT                       PT Short Term Goals - 05/23/21 9449       PT SHORT TERM GOAL #1   Title Will be independent with appropriate progressive HEP    Status Achieved               PT Long Term Goals - 05/30/21 1224       PT LONG TERM GOAL #1   Title MMT to improve by at least 1 grade in all weak groups to reduce pain and improve function    Status On-going      PT LONG TERM GOAL #2   Title Will report pain no worse than 3/10 in order to improve QOL    Status Partially Met      PT LONG TERM GOAL #3   Title Will be compliant with independent gym or group exercise based program in order to maintain functional gains and prevent recurrence of severe pain  Status On-going                   Plan - 05/30/21 1225     Clinical Impression Statement Some stiffness reported at the base of her posterior neck upon entering clinic that improved after warm up and STM. Cues for posture and eccentric control needed with standing shoulder extensions. No reports of pain throughout session. Tactile cue to elbows needed with ER    Personal Factors and Comorbidities Age;Fitness;Behavior Pattern;Past/Current Experience;Comorbidity 2;Time since onset of injury/illness/exacerbation;Social Background    Examination-Activity Limitations Bathing;Reach Overhead;Locomotion Level;Transfers;Bend;Sit;Carry;Squat;Stairs;Dressing;Stand;Lift    Examination-Participation Restrictions Cleaning;Occupation;Community Activity;Shop;Medication Management    Stability/Clinical Decision Making Stable/Uncomplicated    Rehab Potential Fair    PT Frequency 2x / week    PT Duration 2 weeks    PT Treatment/Interventions ADLs/Self Care Home Management;Cryotherapy;Electrical Stimulation;Iontophoresis 4mg /ml Dexamethasone;Moist Heat;Ultrasound;Gait training;Stair training;Functional mobility training;Therapeutic activities;Therapeutic  exercise;Balance training;Neuromuscular re-education;Patient/family education;Manual techniques;Passive range of motion;Dry needling;Energy conservation;Taping;Spinal Manipulations    PT Next Visit Plan general function strengthening and postural training, cervial and thoracic ROM as tolerated             Patient will benefit from skilled therapeutic intervention in order to improve the following deficits and impairments:  Decreased range of motion, Increased muscle spasms, Impaired UE functional use, Decreased activity tolerance, Impaired perceived functional ability, Pain, Hypomobility, Impaired flexibility, Improper body mechanics, Decreased mobility, Decreased strength, Postural dysfunction  Visit Diagnosis: Pain in thoracic spine  Muscle weakness (generalized)     Problem List Patient Active Problem List   Diagnosis Date Noted   OA (osteoarthritis) of knee 07/18/2019   Primary osteoarthritis of left knee 07/18/2019    Scot Jun, PTA 05/30/2021, 12:27 PM  [address]. [place], [place], [address] Phone: [phone]   Fax:  [phone]  Name: Vanessa Blair MRN: [id number] Date of Birth: 1950-10-16

## 2021-06-04 ENCOUNTER — Encounter: Payer: Self-pay | Admitting: Physical Therapy

## 2021-06-04 ENCOUNTER — Ambulatory Visit: Payer: Medicare Other | Admitting: Physical Therapy

## 2021-06-04 ENCOUNTER — Other Ambulatory Visit: Payer: Self-pay

## 2021-06-04 DIAGNOSIS — M546 Pain in thoracic spine: Secondary | ICD-10-CM

## 2021-06-04 DIAGNOSIS — M6281 Muscle weakness (generalized): Secondary | ICD-10-CM

## 2021-06-04 DIAGNOSIS — R252 Cramp and spasm: Secondary | ICD-10-CM

## 2021-06-04 DIAGNOSIS — R293 Abnormal posture: Secondary | ICD-10-CM

## 2021-06-04 NOTE — Therapy (Signed)
[place]. [place], [place], [address] Phone: [phone]   Fax:  [phone]  Physical Therapy Treatment  Patient Details  Name: Vanessa Blair MRN: [id number] Date of Birth: 02/27/1951 Referring Provider (PT): Melina Schools   Encounter Date: 06/04/2021   PT End of Session - 06/04/21 1224     Visit Number 6    Date for PT Re-Evaluation 06/06/21    Authorization Time Period 05/09/21 to 06/06/21    PT Start Time 6222    PT Stop Time 1228    PT Time Calculation (min) 43 min    Activity Tolerance Patient tolerated treatment well    Behavior During Therapy [facility] for tasks assessed/performed             Past Medical History:  Diagnosis Date   Anemia    Arthritis    Hypercholesteremia    Hypertension     Past Surgical History:  Procedure Laterality Date   BREAST SURGERY     TOTAL HIP ARTHROPLASTY     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   Subjective Assessment - 06/04/21 1148     Subjective "I feel all right" L shoulder has been bothering her on the R side    Currently in Pain? No/denies                               [facility] Adult PT Treatment/Exercise - 06/04/21 0001       Lumbar Exercises: Aerobic   UBE (Upper Arm Bike) L2 x3 min each way    Nustep L5 x 6 min      Lumbar Exercises: Standing   Row Strengthening;Power tower;Both;20 reps    Row Limitations 10    Shoulder Extension Strengthening;Both;20 reps;Power Engineering geologist Limitations 5      Lumbar Exercises: Seated   Other Seated Lumbar Exercises horiz Abd  red 2x10    Other Seated Lumbar Exercises ER Red 2x10      Manual Therapy   Manual Therapy Soft tissue mobilization    Soft tissue mobilization Rhomboid  area and UT                       PT Short Term Goals - 05/23/21 9798       PT SHORT TERM GOAL #1   Title Will be independent with appropriate progressive HEP    Status Achieved               PT Long Term Goals - 06/04/21 1224       PT LONG TERM GOAL #1   Title MMT to improve by at least 1 grade in all weak groups to reduce pain and improve function    Status On-going      PT LONG TERM GOAL #2   Title Will report pain no worse than 3/10 in order to improve QOL    Status Partially Met      PT LONG TERM GOAL #3   Title Will be compliant with independent gym or group exercise based program in order to maintain functional gains and prevent recurrence of severe pain    Status Partially Met  Plan - 06/04/21 1224     Clinical Impression Statement Pt did well overall today. she enters with no pain but with reports of some tightness between the shoulder blades. She also stated some tightness in the lateral L shoulder. Subjective complaints improved after MT. Postural cue required with standing shoulder extensions. Cues to keep elbows to side needed with Er.    Personal Factors and Comorbidities Age;Fitness;Behavior Pattern;Past/Current Experience;Comorbidity 2;Time since onset of injury/illness/exacerbation;Social Background    Examination-Activity Limitations Bathing;Reach Overhead;Locomotion Level;Transfers;Bend;Sit;Carry;Squat;Stairs;Dressing;Stand;Lift    Examination-Participation Restrictions Cleaning;Occupation;Community Activity;Shop;Medication Management    Stability/Clinical Decision Making Stable/Uncomplicated    Rehab Potential Fair    PT Frequency 2x / week    PT Duration 2 weeks    PT Treatment/Interventions ADLs/Self Care Home Management;Cryotherapy;Electrical Stimulation;Iontophoresis 4mg /ml Dexamethasone;Moist Heat;Ultrasound;Gait training;Stair training;Functional mobility training;Therapeutic activities;Therapeutic  exercise;Balance training;Neuromuscular re-education;Patient/family education;Manual techniques;Passive range of motion;Dry needling;Energy conservation;Taping;Spinal Manipulations    PT Next Visit Plan general function strengthening and postural training, cervial and thoracic ROM as tolerated             Patient will benefit from skilled therapeutic intervention in order to improve the following deficits and impairments:  Decreased range of motion, Increased muscle spasms, Impaired UE functional use, Decreased activity tolerance, Impaired perceived functional ability, Pain, Hypomobility, Impaired flexibility, Improper body mechanics, Decreased mobility, Decreased strength, Postural dysfunction  Visit Diagnosis: Pain in thoracic spine  Muscle weakness (generalized)  Cramp and spasm  Abnormal posture     Problem List Patient Active Problem List   Diagnosis Date Noted   OA (osteoarthritis) of knee 07/18/2019   Primary osteoarthritis of left knee 07/18/2019    Scot Jun, PTA 06/04/2021, 12:28 PM  [place]. [place], [place], [address] Phone: [phone]   Fax:  [phone]  Name: Vanessa Blair MRN: [id number] Date of Birth: May 09, 1950

## 2021-06-06 ENCOUNTER — Ambulatory Visit: Payer: Medicare Other | Attending: Orthopedic Surgery | Admitting: Physical Therapy

## 2021-06-06 ENCOUNTER — Other Ambulatory Visit: Payer: Self-pay

## 2021-06-06 ENCOUNTER — Encounter: Payer: Self-pay | Admitting: Physical Therapy

## 2021-06-06 DIAGNOSIS — M546 Pain in thoracic spine: Secondary | ICD-10-CM | POA: Diagnosis not present

## 2021-06-06 DIAGNOSIS — M25562 Pain in left knee: Secondary | ICD-10-CM | POA: Diagnosis present

## 2021-06-06 DIAGNOSIS — R293 Abnormal posture: Secondary | ICD-10-CM | POA: Insufficient documentation

## 2021-06-06 DIAGNOSIS — M6281 Muscle weakness (generalized): Secondary | ICD-10-CM | POA: Insufficient documentation

## 2021-06-06 DIAGNOSIS — R252 Cramp and spasm: Secondary | ICD-10-CM | POA: Diagnosis present

## 2021-06-06 NOTE — Therapy (Signed)
[facility] ?[address] ?[place], [place], [address] ?Phone: [phone]   Fax:  [phone] ? ?Physical Therapy Treatment ? ?Patient Details  ?Name: Vanessa Blair ?MRN: [id number] ?Date of Birth: 1951-02-07 ?Referring Provider (PT): Dahari Rolena Infante ? ? ?Encounter Date: 06/06/2021 ? ? PT End of Session - 06/06/21 1141   ? ? Visit Number 7   ? Date for PT Re-Evaluation 06/06/21   ? Authorization Time Period 05/09/21 to 06/06/21   ? PT Start Time 1109   ? PT Stop Time 1144   ? PT Time Calculation (min) 35 min   ? Activity Tolerance Patient tolerated treatment well   ? Behavior During Therapy [facility] for tasks assessed/performed   ? ?  ?  ? ?  ? ? ?Past Medical History:  ?Diagnosis Date  ? Anemia   ? Arthritis   ? Hypercholesteremia   ? Hypertension   ? ? ?Past Surgical History:  ?Procedure Laterality Date  ? BREAST SURGERY    ? TOTAL HIP ARTHROPLASTY    ? 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 Procedure: TOTAL KNEE ARTHROPLASTY;  Surgeon: Gaynelle Arabian, MD;  Location: [facility];  Service: Orthopedics;  Laterality: Left;  ? TUBAL LIGATION    ? ? ?There were no vitals filed for this visit. ? ? Subjective Assessment - 06/06/21 1109   ? ? Subjective "All right" Some pain on the lateral L shoulder   ? Currently in Pain? Yes   ? Pain Score 7    ? Pain Location Shoulder   ? Pain Orientation Left;Lateral   ? ?  ?  ? ?  ? ? ? ? ? ? ? ? ? ? ? ? ? ? ? ? ? ? ? ? [facility] Adult PT Treatment/Exercise - 06/06/21 0001   ? ?  ? Lumbar Exercises: Aerobic  ? UBE (Upper Arm Bike) L2 x3 min each way   ? Nustep L5 x 4 min   ?  ? Lumbar Exercises: Machines for Strengthening  ? Other Lumbar Machine Exercise Rows 20lb Lats 15lb 2x15   ?  ? Lumbar Exercises: Standing  ? Row Strengthening;Power tower;Both;20 reps   ? Row Limitations 10   ?  ? Lumbar Exercises: Seated  ? Other Seated Lumbar Exercises horiz Abd  red 2x10   ? Other Seated Lumbar Exercises ER Red 2x10   ?  ? Manual Therapy  ? Manual  Therapy Soft tissue mobilization;Passive ROM   ? Soft tissue mobilization Rhomboid area and UT   ? Passive ROM LUE in all directions   ? ?  ?  ? ?  ? ? ? ? ? ? ? ? ? ? ? ? PT Short Term Goals - 05/23/21 0953   ? ?  ? PT SHORT TERM GOAL #1  ? Title Will be independent with appropriate progressive HEP   ? Status Achieved   ? ?  ?  ? ?  ? ? ? ? PT Long Term Goals - 06/04/21 1224   ? ?  ? PT LONG TERM GOAL #1  ? Title MMT to improve by at least 1 grade in all weak groups to reduce pain and improve function   ? Status On-going   ?  ? PT LONG TERM GOAL #2  ? Title Will report pain no worse than 3/10 in order to improve QOL   ? Status Partially Met   ?  ? PT LONG TERM GOAL #3  ?  Title Will be compliant with independent gym or group exercise based program in order to maintain functional gains and prevent recurrence of severe pain   ? Status Partially Met   ? ?  ?  ? ?  ? ? ? ? ? ? ? ? Plan - 06/06/21 1141   ? ? Clinical Impression Statement Pt ~ 9 minutes late for today's session reporting some pain ans discomfort in the L shoulder that improved after MT. Additional reps tolerated with seated rows and lats. Postural cues required with sanding shoulder extensions. Cues to increase ROM with Horiz abd.   ? Personal Factors and Comorbidities Age;Fitness;Behavior Pattern;Past/Current Experience;Comorbidity 2;Time since onset of injury/illness/exacerbation;Social Background   ? Examination-Activity Limitations Engineer, drilling;Locomotion Level;Transfers;Bend;Sit;Carry;Squat;Stairs;Dressing;Stand;Lift   ? Examination-Participation Restrictions Cleaning;Occupation;Community Activity;Shop;Medication Management   ? Stability/Clinical Decision Making Stable/Uncomplicated   ? Rehab Potential Fair   ? PT Frequency 2x / week   ? PT Treatment/Interventions ADLs/Self Care Home Management;Cryotherapy;Electrical Stimulation;Iontophoresis 26m/ml Dexamethasone;Moist Heat;Ultrasound;Gait training;Stair training;Functional mobility  training;Therapeutic activities;Therapeutic exercise;Balance training;Neuromuscular re-education;Patient/family education;Manual techniques;Passive range of motion;Dry needling;Energy conservation;Taping;Spinal Manipulations   ? PT Next Visit Plan general function strengthening and postural training, cervial and thoracic ROM as tolerated   ? ?  ?  ? ?  ? ? ?Patient will benefit from skilled therapeutic intervention in order to improve the following deficits and impairments:  Decreased range of motion, Increased muscle spasms, Impaired UE functional use, Decreased activity tolerance, Impaired perceived functional ability, Pain, Hypomobility, Impaired flexibility, Improper body mechanics, Decreased mobility, Decreased strength, Postural dysfunction ? ?Visit Diagnosis: ?Pain in thoracic spine ? ?Cramp and spasm ? ?Abnormal posture ? ?Muscle weakness (generalized) ? ?Acute pain of left knee ? ? ? ? ?Problem List ?Patient Active Problem List  ? Diagnosis Date Noted  ? OA (osteoarthritis) of knee 07/18/2019  ? Primary osteoarthritis of left knee 07/18/2019  ? ? ?RScot Jun PTA ?06/06/2021, 11:44 AM ? ?[place] ?[address] ?[place] [address]?Phone: [phone]  Fax:  [phone]? ?Name: Vanessa Blair?MRN: [id number]?Date of Birth: [birth year]? ? ? ?

## 2021-06-11 ENCOUNTER — Other Ambulatory Visit: Payer: Self-pay

## 2021-06-11 ENCOUNTER — Ambulatory Visit: Payer: Medicare Other | Admitting: Physical Therapy

## 2021-06-11 ENCOUNTER — Encounter: Payer: Self-pay | Admitting: Physical Therapy

## 2021-06-11 DIAGNOSIS — M6281 Muscle weakness (generalized): Secondary | ICD-10-CM

## 2021-06-11 DIAGNOSIS — M546 Pain in thoracic spine: Secondary | ICD-10-CM

## 2021-06-11 DIAGNOSIS — R252 Cramp and spasm: Secondary | ICD-10-CM

## 2021-06-11 DIAGNOSIS — R293 Abnormal posture: Secondary | ICD-10-CM

## 2021-06-11 NOTE — Therapy (Signed)
[facility] [address] ?[place], [place], [address] ?Phone: [phone]   Fax:  [phone] ? ?Physical Therapy Treatment ? ?Patient Details  ?Name: Vanessa Blair ?MRN: [id number] ?Date of Birth: 18-Mar-1951 ?Referring Provider (PT): Dahari Rolena Infante ? ? ?Encounter Date: 06/11/2021 ? ? PT End of Session - 06/11/21 1227   ? ? Visit Number 8   ? Date for PT Re-Evaluation 06/06/21   ? Authorization Time Period 05/09/21 to 06/06/21   ? PT Start Time 1145   ? PT Stop Time 1228   ? PT Time Calculation (min) 43 min   ? Activity Tolerance Patient tolerated treatment well   ? Behavior During Therapy [facility] for tasks assessed/performed   ? ?  ?  ? ?  ? ? ?Past Medical History:  ?Diagnosis Date  ? Anemia   ? Arthritis   ? Hypercholesteremia   ? Hypertension   ? ? ?Past Surgical History:  ?Procedure Laterality Date  ? BREAST SURGERY    ? TOTAL HIP ARTHROPLASTY    ? 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 Procedure: TOTAL KNEE ARTHROPLASTY;  Surgeon: Gaynelle Arabian, MD;  Location: [facility];  Service: Orthopedics;  Laterality: Left;  ? TUBAL LIGATION    ? ? ?There were no vitals filed for this visit. ? ? Subjective Assessment - 06/11/21 1150   ? ? Subjective "Doing good"   ? Currently in Pain? No/denies   ? ?  ?  ? ?  ? ? ? ? ? [facility] PT Assessment - 06/11/21 0001   ? ?  ? AROM  ? Cervical Flexion WNL   ? Cervical Extension WNL   ? Cervical - Right Side Bend 25% limited   ? Cervical - Left Side Bend 25% limited   ? Cervical - Right Rotation WFL   ? Cervical - Left Rotation WFL   ? Thoracic Flexion WFL   ? Thoracic Extension WFL   ? Thoracic - Right Side [facility]   ? Thoracic - Left Side [facility]   ? Thoracic - Right Rotation 25% limited   ? Thoracic - Left Rotation 25% limited   ? ?  ?  ? ?  ? ? ? ? ? ? ? ? ? ? ? ? ? ? ? ? [facility] Adult PT Treatment/Exercise - 06/11/21 0001   ? ?  ? Lumbar Exercises: Aerobic  ? UBE (Upper Arm Bike) L2 x2 min each way   ? Nustep L5 x 5 min   ?  ? Lumbar  Exercises: Standing  ? Row Strengthening;Power tower;Both;20 reps   ? Row Limitations 15   ? Shoulder Extension Strengthening;Both;20 reps;Power Tower   ? Shoulder Extension Limitations 10   ?  ? Lumbar Exercises: Seated  ? Other Seated Lumbar Exercises horiz Abd  red 2x10   ? Other Seated Lumbar Exercises OHP yellow ball 2x15   ?  ? Manual Therapy  ? Manual Therapy Soft tissue mobilization;Passive ROM   ? Soft tissue mobilization Rhomboid area and UT   ? Passive ROM LUE in all directions   ? ?  ?  ? ?  ? ? ? ? ? ? ? ? ? ? ? ? PT Short Term Goals - 05/23/21 0953   ? ?  ? PT SHORT TERM GOAL #1  ? Title Will be independent with appropriate progressive HEP   ? Status Achieved   ? ?  ?  ? ?  ? ? ? ?  PT Long Term Goals - 06/11/21 1204   ? ?  ? PT LONG TERM GOAL #1  ? Title MMT to improve by at least 1 grade in all weak groups to reduce pain and improve function   ? Status On-going   ?  ? PT LONG TERM GOAL #2  ? Title Will report pain no worse than 3/10 in order to improve QOL   ? Status Partially Met   ?  ? PT LONG TERM GOAL #3  ? Title Will be compliant with independent gym or group exercise based program in order to maintain functional gains and prevent recurrence of severe pain   ? Status Partially Met   ? ?  ?  ? ?  ? ? ? ? ? ? ? ? Plan - 06/11/21 1228   ? ? Clinical Impression Statement Pt enters clinic feeling well. She has progressed increasing her cervical and thoracic spine AROM in all directions. She reports improved mobility at home and with ADLs. Pt has scoliosis and continues to report the occasional pain between her shoulder blade where her spine curves. Postural cue required with standing shoulder abduction. Increase resistance tolerated with standing rows and extensions.   ? Personal Factors and Comorbidities Age;Fitness;Behavior Pattern;Past/Current Experience;Comorbidity 2;Time since onset of injury/illness/exacerbation;Social Background   ? Examination-Activity Limitations Programmer, multimedia;Locomotion Level;Transfers;Bend;Sit;Carry;Squat;Stairs;Dressing;Stand;Lift   ? Examination-Participation Restrictions Cleaning;Occupation;Community Activity;Shop;Medication Management   ? Rehab Potential Fair   ? PT Frequency 2x / week   ? PT Duration 2 weeks   ? PT Treatment/Interventions ADLs/Self Care Home Management;Cryotherapy;Electrical Stimulation;Iontophoresis 4mg /ml Dexamethasone;Moist Heat;Ultrasound;Gait training;Stair training;Functional mobility training;Therapeutic activities;Therapeutic exercise;Balance training;Neuromuscular re-education;Patient/family education;Manual techniques;Passive range of motion;Dry needling;Energy conservation;Taping;Spinal Manipulations   ? PT Next Visit Plan general function strengthening and postural training, cervial and thoracic ROM as tolerated   ? ?  ?  ? ?  ? ? ?Patient will benefit from skilled therapeutic intervention in order to improve the following deficits and impairments:  Decreased range of motion, Increased muscle spasms, Impaired UE functional use, Decreased activity tolerance, Impaired perceived functional ability, Pain, Hypomobility, Impaired flexibility, Improper body mechanics, Decreased mobility, Decreased strength, Postural dysfunction ? ?Visit Diagnosis: ?No diagnosis found. ? ? ? ? ?Problem List ?Patient Active Problem List  ? Diagnosis Date Noted  ? OA (osteoarthritis) of knee 07/18/2019  ? Primary osteoarthritis of left knee 07/18/2019  ? ? ?Scot Jun, PTA ?06/11/2021, 12:31 PM ? ?[facility] [address] ?[place], [place], [address] ?Phone: [phone]   Fax:  [phone] ? ?Name: Vanessa Blair ?MRN: [id number] ?Date of Birth: [birth year] ? ? ? ?

## 2021-06-13 ENCOUNTER — Ambulatory Visit: Payer: Medicare Other | Admitting: Physical Therapy

## 2021-06-15 ENCOUNTER — Other Ambulatory Visit: Payer: Self-pay

## 2021-06-15 ENCOUNTER — Encounter (HOSPITAL_BASED_OUTPATIENT_CLINIC_OR_DEPARTMENT_OTHER): Payer: Self-pay | Admitting: Emergency Medicine

## 2021-06-15 ENCOUNTER — Emergency Department (HOSPITAL_BASED_OUTPATIENT_CLINIC_OR_DEPARTMENT_OTHER)
Admission: EM | Admit: 2021-06-15 | Discharge: 2021-06-15 | Discharge status: Against Medical Advice | Payer: Medicare Other | Attending: Emergency Medicine | Admitting: Emergency Medicine

## 2021-06-15 DIAGNOSIS — Z20822 Contact with and (suspected) exposure to covid-19: Secondary | ICD-10-CM | POA: Diagnosis not present

## 2021-06-15 DIAGNOSIS — Z5321 Procedure and treatment not carried out due to patient leaving prior to being seen by health care provider: Secondary | ICD-10-CM | POA: Insufficient documentation

## 2021-06-15 DIAGNOSIS — R0981 Nasal congestion: Secondary | ICD-10-CM | POA: Insufficient documentation

## 2021-06-15 DIAGNOSIS — J019 Acute sinusitis, unspecified: Secondary | ICD-10-CM

## 2021-06-15 LAB — RESP PANEL BY RT-PCR (FLU A&B, COVID) ARPGX2
Influenza A by PCR: NEGATIVE
Influenza B by PCR: NEGATIVE
SARS Coronavirus 2 by RT PCR: NEGATIVE

## 2021-06-15 MED ORDER — OXYMETAZOLINE HCL 0.05 % NA SOLN: 1.0000 | Freq: Two times a day (BID) | NASAL | 0 refills | Status: AC

## 2021-06-15 MED ORDER — AMOXICILLIN-POT CLAVULANATE 875-125 MG PO TABS: 1.0000 | ORAL_TABLET | Freq: Two times a day (BID) | ORAL | 0 refills | Status: DC

## 2021-06-15 NOTE — ED Notes (Signed)
Pt discharged to home. Discharge instructions have been discussed with patient and/or family members. Pt verbally acknowledges understanding d/c instructions, and endorses comprehension to checkout at registration before leaving.  °

## 2021-06-15 NOTE — ED Provider Notes (Incomplete)
[facility] EMERGENCY DEPARTMENT Provider Note   CSN: [id number] Arrival date & time: 06/15/21  5397     History {Add pertinent medical, surgical, social history, OB history to HPI:1} Chief Complaint  Patient presents with   Nasal Congestion    Vanessa Blair is a 71 y.o. female.  Pt is a 71 yo female presenting for nasal congestion, sinus pressure, and sore throat x 2 weeks. Presenting today for concerns for changes in smell. Recently seen by pcp and given azithromycin for suspected sinus infection. Home covid testing negative x 2. Denies fevers or chills. Denies chest pain or coughing. Also given steriod injection 2 days ago. States over the last two days she has felt more jittery than normaly. States she has hard time sleeping at night due to loss of her husband and racing thoughts. More "jittery" at this time. Has xanax 0.25mg  from pcp that helps resolve symptoms.   The history is provided by the patient. No language interpreter was used.      Home Medications Prior to Admission medications   Medication Sig Start Date End Date Taking? Authorizing Provider  ALPRAZolam (XANAX) 0.25 MG tablet Take 0.25 mg by mouth at bedtime. 06/13/19   [provider]  calcium carbonate (OS-CAL - DOSED IN MG OF ELEMENTAL CALCIUM) 1250 (500 Ca) MG tablet Take 1 tablet by mouth daily with breakfast.    [provider]  cephALEXin (KEFLEX) 500 MG capsule Take 1 capsule (500 mg total) by mouth 2 (two) times daily. 08/05/20   Palumbo, April, MD  cholecalciferol (VITAMIN D3) 25 MCG (1000 UNIT) tablet Take 1,000 Units by mouth daily.    [provider]  esomeprazole (NEXIUM) 40 MG capsule Take 40 mg by mouth daily. 06/05/19   [provider]  fluticasone (FLONASE) 50 MCG/ACT nasal spray Place 1 spray into both nostrils daily as needed for allergies. 12/01/16   [provider]  gabapentin (NEURONTIN) 300 MG capsule Take 1 capsule (300 mg total) by mouth 3  (three) times daily. Take a 300 mg capsule three times a day for two weeks following surgery.Then take a 300 mg capsule two times a day for two weeks. Then take a 300 mg capsule once a day for two weeks. Then discontinue the Gabapentin. 07/20/19   Cassandria Anger, PA-C  lisinopril-hydrochlorothiazide (ZESTORETIC) 20-25 MG tablet Take 1 tablet by mouth daily. 06/05/19   [provider]  meclizine (ANTIVERT) 25 MG tablet Take 1 tablet (25 mg total) by mouth 3 (three) times daily as needed for dizziness. 10/17/19   Melene Plan, DO  methocarbamol (ROBAXIN) 500 MG tablet Take 1 tablet (500 mg total) by mouth every 6 (six) hours as needed for muscle spasms. 07/20/19   Cassandria Anger, PA-C  metoprolol tartrate (LOPRESSOR) 50 MG tablet Take 50 mg by mouth 2 (two) times daily. 06/05/19   [provider]  Multiple Vitamin (MULTIVITAMIN WITH MINERALS) TABS tablet Take 1 tablet by mouth daily.    [provider]  ondansetron (ZOFRAN) 4 MG tablet Take 1 tablet (4 mg total) by mouth every 6 (six) hours as needed for nausea. 07/19/19   Edmisten, Kristie L, PA  oxyCODONE (OXY IR/ROXICODONE) 5 MG immediate release tablet Take 1-2 tablets (5-10 mg total) by mouth every 6 (six) hours as needed for severe pain. 07/20/19   Cassandria Anger, PA-C  simvastatin (ZOCOR) 20 MG tablet Take 20 mg by mouth at bedtime. 02/08/19   [provider]  tobramycin Ernestene Mention)  0.3 % ophthalmic solution Place 1 drop into the left eye See admin instructions. Instill one drop into the left eye four times daily the day of and the day after eye injection 06/20/19   [provider]  traMADol (ULTRAM) 50 MG tablet Take 1-2 tablets (50-100 mg total) by mouth every 6 (six) hours as needed for moderate pain. 07/20/19   Cassandria Angeronovan, Ashley R, PA-C  vitamin B-12 (CYANOCOBALAMIN) 1000 MCG tablet Take 1,000 mcg by mouth daily.    [provider]      Allergies    Vicodin [hydrocodone-acetaminophen]    Review  of Systems   Review of Systems  Constitutional:  Negative for chills and fever.  HENT:  Positive for congestion, postnasal drip, sinus pressure and sore throat. Negative for ear pain.   Eyes:  Negative for pain and visual disturbance.  Respiratory:  Negative for cough and shortness of breath.   Cardiovascular:  Negative for chest pain and palpitations.  Gastrointestinal:  Negative for abdominal pain and vomiting.  Genitourinary:  Negative for dysuria and hematuria.  Musculoskeletal:  Negative for arthralgias and back pain.  Skin:  Negative for color change and rash.  Neurological:  Negative for seizures and syncope.  All other systems reviewed and are negative.  Physical Exam Updated Vital Signs BP (!) 131/93 (BP Location: Left Arm)    Pulse 72    Temp 97.7 F (36.5 C) (Oral)    Resp 16    Ht 5\' 9"  (1.753 m)    Wt 104.3 kg    SpO2 98%    BMI 33.97 kg/m  Physical Exam Vitals and nursing note reviewed.  Constitutional:      General: She is not in acute distress.    Appearance: She is well-developed.  HENT:     Head: Normocephalic and atraumatic.     Nose: Congestion present.     Mouth/Throat:     Lips: Pink.     Mouth: Mucous membranes are moist.  Eyes:     Conjunctiva/sclera: Conjunctivae normal.  Cardiovascular:     Rate and Rhythm: Normal rate and regular rhythm.     Heart sounds: No murmur heard. Pulmonary:     Effort: Pulmonary effort is normal. No respiratory distress.     Breath sounds: Normal breath sounds.  Abdominal:     Palpations: Abdomen is soft.     Tenderness: There is no abdominal tenderness.  Musculoskeletal:        General: No swelling.     Cervical back: Neck supple.  Skin:    General: Skin is warm and dry.     Capillary Refill: Capillary refill takes less than 2 seconds.  Neurological:     Mental Status: She is alert.  Psychiatric:        Mood and Affect: Mood normal.    ED Results / Procedures / Treatments   Labs (all labs ordered are listed,  but only abnormal results are displayed) Labs Reviewed  RESP PANEL BY RT-PCR (FLU A&B, COVID) ARPGX2    EKG None  Radiology No results found.  Procedures Procedures  {Document cardiac monitor, telemetry assessment procedure when appropriate:1}  Medications Ordered in ED Medications - No data to display  ED Course/ Medical Decision Making/ A&P                           Medical Decision Making  8:33 AM 71 yo female presenting for nasal congestion, sinus pressure, and  sore throat x 2 weeks.  {Document critical care time when appropriate:1} {Document review of labs and clinical decision tools ie heart score, Chads2Vasc2 etc:1}  {Document your independent review of radiology images, and any outside records:1} {Document your discussion with family members, caretakers, and with consultants:1} {Document social determinants of health affecting pt's care:1} {Document your decision making why or why not admission, treatments were needed:1} Final Clinical Impression(s) / ED Diagnoses Final diagnoses:  None    Rx / DC Orders ED Discharge Orders     None

## 2021-06-15 NOTE — ED Notes (Signed)
ED Provider at bedside. 

## 2021-06-15 NOTE — ED Triage Notes (Signed)
Pt arrives pov, steady gait, reports congestion for several days, reports "feeling jittery inside" upon waking this am. Currently taking abx, and received "steroid injection".  PT AO x 4 ?

## 2021-06-17 ENCOUNTER — Ambulatory Visit: Payer: Medicare Other | Admitting: Physical Therapy

## 2021-06-20 ENCOUNTER — Encounter: Payer: Self-pay | Admitting: Physical Therapy

## 2021-06-20 ENCOUNTER — Other Ambulatory Visit: Payer: Self-pay

## 2021-06-20 ENCOUNTER — Ambulatory Visit: Payer: Medicare Other | Admitting: Physical Therapy

## 2021-06-20 DIAGNOSIS — M546 Pain in thoracic spine: Secondary | ICD-10-CM | POA: Diagnosis not present

## 2021-06-20 NOTE — Therapy (Signed)
[facility] [address] ?[place], [place], [address] ?Phone: [phone]   Fax:  [phone] ? ?Physical Therapy Treatment ? ?Patient Details  ?Name: Vanessa Blair ?MRN: [id number] ?Date of Birth: 01-15-1951 ?Referring Provider (PT): Dahari Rolena Infante ? ? ?Encounter Date: 06/20/2021 ? ? PT End of Session - 06/20/21 0924   ? ? Visit Number 9   ? Date for PT Re-Evaluation 06/06/21   ? Authorization Time Period 05/09/21 to 06/06/21   ? PT Start Time [phone]   ? PT Stop Time 2703   ? PT Time Calculation (min) 37 min   ? Activity Tolerance Patient tolerated treatment well   ? Behavior During Therapy [facility] for tasks assessed/performed   ? ?  ?  ? ?  ? ? ?Past Medical History:  ?Diagnosis Date  ? Anemia   ? Arthritis   ? Hypercholesteremia   ? Hypertension   ? ? ?Past Surgical History:  ?Procedure Laterality Date  ? BREAST SURGERY    ? TOTAL HIP ARTHROPLASTY    ? 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 Procedure: TOTAL KNEE ARTHROPLASTY;  Surgeon: Gaynelle Arabian, MD;  Location: [facility];  Service: Orthopedics;  Laterality: Left;  ? TUBAL LIGATION    ? ? ?There were no vitals filed for this visit. ? ? Subjective Assessment - 06/20/21 0851   ? ? Subjective "I feel all right"   ? Currently in Pain? No/denies   "Back is just stiff"  ? ?  ?  ? ?  ? ? ? ? ? ? ? ? ? ? ? ? ? ? ? ? ? ? ? ? [facility] Adult PT Treatment/Exercise - 06/20/21 0001   ? ?  ? Lumbar Exercises: Aerobic  ? UBE (Upper Arm Bike) L2.3  x3 min each way   ?  ? Lumbar Exercises: Machines for Strengthening  ? Other Lumbar Machine Exercise Rows 20lb Lats 15lb 2x15   ?  ? Lumbar Exercises: Standing  ? Shoulder Extension Strengthening;Both;20 reps;Power Tower   ? Shoulder Extension Limitations 10   ?  ? Lumbar Exercises: Seated  ? Other Seated Lumbar Exercises horiz Abd  red 2x10   ?  ? Manual Therapy  ? Manual Therapy Soft tissue mobilization;Passive ROM   ? Soft tissue mobilization Rhomboid area and UT   ? Passive ROM LUE in  all directions   ? ?  ?  ? ?  ? ? ? ? ? ? ? ? ? ? ? ? PT Short Term Goals - 05/23/21 0953   ? ?  ? PT SHORT TERM GOAL #1  ? Title Will be independent with appropriate progressive HEP   ? Status Achieved   ? ?  ?  ? ?  ? ? ? ? PT Long Term Goals - 06/11/21 1204   ? ?  ? PT LONG TERM GOAL #1  ? Title MMT to improve by at least 1 grade in all weak groups to reduce pain and improve function   ? Status On-going   ?  ? PT LONG TERM GOAL #2  ? Title Will report pain no worse than 3/10 in order to improve QOL   ? Status Partially Met   ?  ? PT LONG TERM GOAL #3  ? Title Will be compliant with independent gym or group exercise based program in order to maintain functional gains and prevent recurrence of severe pain   ? Status Partially Met   ? ?  ?  ? ?  ? ? ? ? ? ? ? ?  Plan - 06/20/21 0927   ? ? Clinical Impression Statement Pt enters clinic ~ 5 minutes late for today's session reporting some stiffness in her upper back. Positive response from MT reporting increased tissue elasticity. Postural cues required with seated rows and standing shoulder extensions. No report of increase pain. Pt stated she felt better post session.   ? Personal Factors and Comorbidities Age;Fitness;Behavior Pattern;Past/Current Experience;Comorbidity 2;Time since onset of injury/illness/exacerbation;Social Background   ? Examination-Activity Limitations Engineer, drilling;Locomotion Level;Transfers;Bend;Sit;Carry;Squat;Stairs;Dressing;Stand;Lift   ? Examination-Participation Restrictions Cleaning;Occupation;Community Activity;Shop;Medication Management   ? Stability/Clinical Decision Making Stable/Uncomplicated   ? Rehab Potential Fair   ? PT Frequency 2x / week   ? PT Treatment/Interventions ADLs/Self Care Home Management;Cryotherapy;Electrical Stimulation;Iontophoresis 31m/ml Dexamethasone;Moist Heat;Ultrasound;Gait training;Stair training;Functional mobility training;Therapeutic activities;Therapeutic exercise;Balance training;Neuromuscular  re-education;Patient/family education;Manual techniques;Passive range of motion;Dry needling;Energy conservation;Taping;Spinal Manipulations   ? PT Next Visit Plan general function strengthening and postural training, cervial and thoracic ROM as tolerated   ? ?  ?  ? ?  ? ? ?Patient will benefit from skilled therapeutic intervention in order to improve the following deficits and impairments:  Decreased range of motion, Increased muscle spasms, Impaired UE functional use, Decreased activity tolerance, Impaired perceived functional ability, Pain, Hypomobility, Impaired flexibility, Improper body mechanics, Decreased mobility, Decreased strength, Postural dysfunction ? ?Visit Diagnosis: ?Pain in thoracic spine ? ?Abnormal posture ? ?Muscle weakness (generalized) ? ?Cramp and spasm ? ? ? ? ?Problem List ?Patient Active Problem List  ? Diagnosis Date Noted  ? OA (osteoarthritis) of knee 07/18/2019  ? Primary osteoarthritis of left knee 07/18/2019  ? ? ?RScot Jun PTA ?06/20/2021, 9:29 AM ? ?[place] [address] ?[place] [address]?Phone: [phone]  Fax:  [phone]? ?Name: LCARSON MECHE?MRN: [id number]?Date of Birth: [birth year]? ? ? ?

## 2021-06-25 ENCOUNTER — Ambulatory Visit: Payer: Medicare Other | Admitting: Physical Therapy

## 2021-06-27 ENCOUNTER — Ambulatory Visit: Payer: Medicare Other | Admitting: Physical Therapy

## 2021-06-27 ENCOUNTER — Other Ambulatory Visit: Payer: Self-pay

## 2021-06-27 ENCOUNTER — Encounter: Payer: Self-pay | Admitting: Physical Therapy

## 2021-06-27 DIAGNOSIS — R252 Cramp and spasm: Secondary | ICD-10-CM

## 2021-06-27 DIAGNOSIS — M546 Pain in thoracic spine: Secondary | ICD-10-CM

## 2021-06-27 DIAGNOSIS — R293 Abnormal posture: Secondary | ICD-10-CM

## 2021-06-27 DIAGNOSIS — M6281 Muscle weakness (generalized): Secondary | ICD-10-CM

## 2021-06-27 NOTE — Therapy (Signed)
[facility] ?[address] ?[place], [place], [address] ?Phone: [phone]   Fax:  [phone] ?Progress Note ?Reporting Period 05/09/21 to 06/27/21 ? ?See note below for Objective Data and Assessment of Progress/Goals.  ? ?  ?Physical Therapy Treatment ? ?Patient Details  ?Name: Vanessa Blair ?MRN: [id number] ?Date of Birth: 1950/10/27 ?Referring Provider (PT): Dahari Rolena Infante ? ? ?Encounter Date: 06/27/2021 ? ? PT End of Session - 06/27/21 1149   ? ? Date for PT Re-Evaluation 07/04/21   ? PT Start Time 1145   ? PT Stop Time 1230   ? PT Time Calculation (min) 45 min   ? Activity Tolerance Patient tolerated treatment well   ? Behavior During Therapy [facility] for tasks assessed/performed   ? ?  ?  ? ?  ? ? ?Past Medical History:  ?Diagnosis Date  ? Anemia   ? Arthritis   ? Hypercholesteremia   ? Hypertension   ? ? ?Past Surgical History:  ?Procedure Laterality Date  ? BREAST SURGERY    ? TOTAL HIP ARTHROPLASTY    ? 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 Procedure: TOTAL KNEE ARTHROPLASTY;  Surgeon: Gaynelle Arabian, MD;  Location: [facility];  Service: Orthopedics;  Laterality: Left;  ? TUBAL LIGATION    ? ? ?There were no vitals filed for this visit. ? ? Subjective Assessment - 06/27/21 1145   ? ? Subjective "Im all right"   ? Currently in Pain? No/denies   ? Pain Location --   stiffness  ? ?  ?  ? ?  ? ? ? ? ? [facility] PT Assessment - 06/27/21 0001   ? ?  ? AROM  ? Cervical Flexion WNL   ? Cervical Extension WNL   ? Cervical - Right Side Bend 25% limited   ? Cervical - Left Side [facility]   ? Cervical - Right Rotation WFL   ? Cervical - Left Rotation WFL   ? Thoracic Flexion WFL   ? Thoracic Extension WFL   ? Thoracic - Right Side [facility]   ? Thoracic - Left Side [facility]   ? Thoracic - Right Rotation WFL   ? Thoracic - Left Rotation WFL   ?  ? Strength  ? Right Shoulder Flexion 4+/5   ? Right Shoulder Extension 4/5   ? Right Shoulder External Rotation 4/5   ? Left Shoulder Flexion  4/5   ? Left Shoulder ABduction 4/5   ? Left Shoulder External Rotation 4/5   ? Right Elbow Extension 4/5   ? Left Elbow Extension 4+/5   ? ?  ?  ? ?  ? ? ? ? ? ? ? ? ? ? ? ? ? ? ? ? [place] Adult PT Treatment/Exercise - 06/27/21 0001   ? ?  ? Lumbar Exercises: Aerobic  ? Nustep L5 x 7 min   ?  ? Lumbar Exercises: Machines for Strengthening  ? Other Lumbar Machine Exercise Rows 20lb Lats 15lb 2x15   ?  ? Lumbar Exercises: Standing  ? Row Strengthening;Power tower;Both;20 reps   ? Row Limitations 15   ? Shoulder Extension Strengthening;Both;20 reps;Power Tower   ? Shoulder Extension Limitations 10   ?  ? Lumbar Exercises: Seated  ? Other Seated Lumbar Exercises Sit to stand OHP yellow ball 2x10   ?  ? Shoulder Exercises: Seated  ? Other Seated Exercises Horiz Abd green 2x10   ? Other Seated Exercises ER red 2x10   ?  ?  Shoulder Exercises: Standing  ? Other Standing Exercises Bilst shoulder Flex 2lb WaTE 2x15   ? ?  ?  ? ?  ? ? ? ? ? ? ? ? ? ? ? ? PT Short Term Goals - 05/23/21 0953   ? ?  ? PT SHORT TERM GOAL #1  ? Title Will be independent with appropriate progressive HEP   ? Status Achieved   ? ?  ?  ? ?  ? ? ? ? PT Long Term Goals - 06/27/21 1205   ? ?  ? PT LONG TERM GOAL #1  ? Title MMT to improve by at least 1 grade in all weak groups to reduce pain and improve function   ? Status Partially Met   ? ?  ?  ? ?  ? ? ? ? ? ? ? ? Plan - 06/27/21 1221   ? ? Clinical Impression Statement Pt has progressed towards LTG,. She has increased her upper body strength as well as increased her cervical AROM.  Tactile cues for posture needed with shoulder extensions. Cue to prevent shoulder protraction needed with seated rows. Improved mobility reported post treatment.   ? Personal Factors and Comorbidities Age;Fitness;Behavior Pattern;Past/Current Experience;Comorbidity 2;Time since onset of injury/illness/exacerbation;Social Background   ? Examination-Activity Limitations Engineer, drilling;Locomotion  Level;Transfers;Bend;Sit;Carry;Squat;Stairs;Dressing;Stand;Lift   ? Examination-Participation Restrictions Cleaning;Occupation;Community Activity;Shop;Medication Management   ? Rehab Potential Fair   ? PT Frequency 2x / week   ? PT Duration 2 weeks   ? PT Treatment/Interventions ADLs/Self Care Home Management;Cryotherapy;Electrical Stimulation;Iontophoresis 4mg /ml Dexamethasone;Moist Heat;Ultrasound;Gait training;Stair training;Functional mobility training;Therapeutic activities;Therapeutic exercise;Balance training;Neuromuscular re-education;Patient/family education;Manual techniques;Passive range of motion;Dry needling;Energy conservation;Taping;Spinal Manipulations   ? PT Next Visit Plan general function strengthening and postural training, cervial and thoracic ROM as tolerated   ? ?  ?  ? ?  ? ? ?Patient will benefit from skilled therapeutic intervention in order to improve the following deficits and impairments:  Decreased range of motion, Increased muscle spasms, Impaired UE functional use, Decreased activity tolerance, Impaired perceived functional ability, Pain, Hypomobility, Impaired flexibility, Improper body mechanics, Decreased mobility, Decreased strength, Postural dysfunction ? ?Visit Diagnosis: ?Pain in thoracic spine ? ?Abnormal posture ? ?Muscle weakness (generalized) ? ?Cramp and spasm ? ? ? ? ?Problem List ?Patient Active Problem List  ? Diagnosis Date Noted  ? OA (osteoarthritis) of knee 07/18/2019  ? Primary osteoarthritis of left knee 07/18/2019  ? ? ?Scot Jun, PTA ?06/27/2021, 12:23 PM ? ?[facility] ?[address] ?[place], [place], [address] ?Phone: [phone]   Fax:  [phone] ? ?Name: Vanessa Blair ?MRN: [id number] ?Date of Birth: 25-Sep-1950 ? ? ? ?

## 2021-07-02 ENCOUNTER — Ambulatory Visit: Payer: Medicare Other | Admitting: Physical Therapy

## 2021-07-02 ENCOUNTER — Other Ambulatory Visit: Payer: Self-pay

## 2021-07-02 DIAGNOSIS — M546 Pain in thoracic spine: Secondary | ICD-10-CM | POA: Diagnosis not present

## 2021-07-02 DIAGNOSIS — R293 Abnormal posture: Secondary | ICD-10-CM

## 2021-07-02 DIAGNOSIS — M6281 Muscle weakness (generalized): Secondary | ICD-10-CM

## 2021-07-02 NOTE — Therapy (Signed)
[facility] ?[place] [address] ?[place], [place], [address] ?Phone: [phone]   Fax:  [phone] ? ?Physical Therapy Treatment ? ?Patient Details  ?Name: Vanessa Blair ?MRN: [id number] ?Date of Birth: 18-Jul-1950 ?Referring Provider (PT): Dahari Rolena Infante ? ? ?Encounter Date: 07/02/2021 ? ? PT End of Session - 07/02/21 1113   ? ? Visit Number 11   ? Date for PT Re-Evaluation 07/04/21   ? Authorization Type UHC   ? PT Start Time 1100   ? PT Stop Time 1140   ? PT Time Calculation (min) 40 min   ? ?  ?  ? ?  ? ? ?Past Medical History:  ?Diagnosis Date  ? Anemia   ? Arthritis   ? Hypercholesteremia   ? Hypertension   ? ? ?Past Surgical History:  ?Procedure Laterality Date  ? BREAST SURGERY    ? TOTAL HIP ARTHROPLASTY    ? 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 Procedure: TOTAL KNEE ARTHROPLASTY;  Surgeon: Gaynelle Arabian, MD;  Location: [facility];  Service: Orthopedics;  Laterality: Left;  ? TUBAL LIGATION    ? ? ?There were no vitals filed for this visit. ? ? Subjective Assessment - 07/02/21 1101   ? ? Subjective feeling okay, alittle pain in cerv area ( points to C/T junc). i can really tell when I do not come   ? Currently in Pain? Yes   ? Pain Score 7    ? Pain Location Neck   ? ?  ?  ? ?  ? ? ? ? ? ? ? ? ? ? ? ? ? ? ? ? ? ? ? ? [facility] Adult PT Treatment/Exercise - 07/02/21 0001   ? ?  ? Lumbar Exercises: Aerobic  ? Nustep L5 x 7 min   ?  ? Lumbar Exercises: Machines for Strengthening  ? Other Lumbar Machine Exercise Rows 20lb Lats 20lb 2x15   ?  ? Lumbar Exercises: Standing  ? Other Standing Lumbar Exercises wt ball OH ext and rotation 10 each   ? Other Standing Lumbar Exercises 3 pt rhy stab 10 reps 3#   ?  ? Lumbar Exercises: Seated  ? Other Seated Lumbar Exercises horiz Abd and ER   red 2x10   diagnols 10 each way red tband  ? Other Seated Lumbar Exercises 4# row, shld horz abd and shld ext 2 sets 10   ?  ? Shoulder Exercises: Standing  ? Other Standing Exercises red  tband 3 point stab 10 each   ? Other Standing Exercises wall/postural angles 10 x   ?  ? Manual Therapy  ? Manual Therapy Soft tissue mobilization;Passive ROM   ? Soft tissue mobilization C/T area tightness RT > Left   ? ?  ?  ? ?  ? ? ? ? ? ? ? ? ? ? ? ? PT Short Term Goals - 05/23/21 0953   ? ?  ? PT SHORT TERM GOAL #1  ? Title Will be independent with appropriate progressive HEP   ? Status Achieved   ? ?  ?  ? ?  ? ? ? ? PT Long Term Goals - 07/02/21 1113   ? ?  ? PT LONG TERM GOAL #1  ? Title MMT to improve by at least 1 grade in all weak groups to reduce pain and improve function   ? Status Partially Met   ?  ? PT LONG TERM GOAL #2  ? Title  Will report pain no worse than 3/10 in order to improve QOL   ? Status Partially Met   ?  ? PT LONG TERM GOAL #3  ? Title Will be compliant with independent gym or group exercise based program in order to maintain functional gains and prevent recurrence of severe pain   ? Status Partially Met   ? ?  ?  ? ?  ? ? ? ? ? ? ? ? Plan - 07/02/21 1114   ? ? Clinical Impression Statement progressing with goals but still limited so after discussion with pt will recert next visit. tightness with STW C/T area RT > Left. Fatigue with ex - cuing and rest needed   ? PT Treatment/Interventions ADLs/Self Care Home Management;Cryotherapy;Electrical Stimulation;Iontophoresis 4mg /ml Dexamethasone;Moist Heat;Ultrasound;Gait training;Stair training;Functional mobility training;Therapeutic activities;Therapeutic exercise;Balance training;Neuromuscular re-education;Patient/family education;Manual techniques;Passive range of motion;Dry needling;Energy conservation;Taping;Spinal Manipulations   ? PT Next Visit Plan general function strengthening and postural training, cervial and thoracic ROM as tolerated- RECERT next visit   ? ?  ?  ? ?  ? ? ?Patient will benefit from skilled therapeutic intervention in order to improve the following deficits and impairments:  Decreased range of motion, Increased  muscle spasms, Impaired UE functional use, Decreased activity tolerance, Impaired perceived functional ability, Pain, Hypomobility, Impaired flexibility, Improper body mechanics, Decreased mobility, Decreased strength, Postural dysfunction ? ?Visit Diagnosis: ?Pain in thoracic spine ? ?Abnormal posture ? ?Muscle weakness (generalized) ? ? ? ? ?Problem List ?Patient Active Problem List  ? Diagnosis Date Noted  ? OA (osteoarthritis) of knee 07/18/2019  ? Primary osteoarthritis of left knee 07/18/2019  ? ? ?Jamiaya Bina,ANGIE, PTA ?07/02/2021, 11:36 AM ? ?[facility] ?[place] ?[address] ?[place], [place], [address] ?Phone: [phone]   Fax:  [phone] ? ?Name: Vanessa Blair ?MRN: [id number] ?Date of Birth: [birth year] ? ? ? ?

## 2021-07-04 ENCOUNTER — Ambulatory Visit: Payer: Medicare Other | Admitting: Physical Therapy

## 2021-07-11 ENCOUNTER — Ambulatory Visit: Payer: Medicare Other | Attending: Orthopedic Surgery | Admitting: Physical Therapy

## 2021-07-11 ENCOUNTER — Encounter: Payer: Self-pay | Admitting: Physical Therapy

## 2021-07-11 DIAGNOSIS — M6281 Muscle weakness (generalized): Secondary | ICD-10-CM | POA: Insufficient documentation

## 2021-07-11 DIAGNOSIS — R252 Cramp and spasm: Secondary | ICD-10-CM | POA: Diagnosis present

## 2021-07-11 DIAGNOSIS — M546 Pain in thoracic spine: Secondary | ICD-10-CM | POA: Insufficient documentation

## 2021-07-11 DIAGNOSIS — R293 Abnormal posture: Secondary | ICD-10-CM | POA: Insufficient documentation

## 2021-07-11 NOTE — Therapy (Signed)
?[facility] ?[address] ?[place], [place], [address] ?Phone: [phone]   Fax:  [phone] ? ?Physical Therapy Treatment ? ?Patient Details  ?Name: Vanessa Blair ?MRN: [id number] ?Date of Birth: Jun 02, 1950 ?Referring Provider (PT): Dahari Rolena Infante ? ? ?Encounter Date: 07/11/2021 ? ? PT End of Session - 07/11/21 1053   ? ? Visit Number 12   ? Date for PT Re-Evaluation 07/04/21   ? PT Start Time 1015   ? PT Stop Time 1058   ? PT Time Calculation (min) 43 min   ? Activity Tolerance Patient tolerated treatment well   ? Behavior During Therapy [facility] for tasks assessed/performed   ? ?  ?  ? ?  ? ? ?Past Medical History:  ?Diagnosis Date  ? Anemia   ? Arthritis   ? Hypercholesteremia   ? Hypertension   ? ? ?Past Surgical History:  ?Procedure Laterality Date  ? BREAST SURGERY    ? TOTAL HIP ARTHROPLASTY    ? 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 Procedure: TOTAL KNEE ARTHROPLASTY;  Surgeon: Gaynelle Arabian, MD;  Location: [facility];  Service: Orthopedics;  Laterality: Left;  ? TUBAL LIGATION    ? ? ?There were no vitals filed for this visit. ? ? Subjective Assessment - 07/11/21 1020   ? ? Subjective "I can tell I haven't been here in a while'   ? Currently in Pain? Yes   ? Pain Score 8    ? Pain Location Back   ? Pain Descriptors / Indicators --   stiffness  ? ?  ?  ? ?  ? ? ? ? ? ? ? ? ? ? ? ? ? ? ? ? ? ? ? ? [facility] Adult PT Treatment/Exercise - 07/11/21 0001   ? ?  ? Lumbar Exercises: Aerobic  ? Nustep L5 x 7 min   ?  ? Lumbar Exercises: Machines for Strengthening  ? Other Lumbar Machine Exercise Rows 20lb Lats 20lb 2x15   ?  ? Lumbar Exercises: Standing  ? Shoulder Extension Strengthening;Both;20 reps;Power Tower   ? Shoulder Extension Limitations 10   ? Other Standing Lumbar Exercises wt ball OH ext and rotation 10 each   ?  ? Lumbar Exercises: Seated  ? Sit to Stand 10 reps   x2 OHP yellow ball  ? Other Seated Lumbar Exercises horiz Abd and ER   red 2x10   ? ?  ?  ? ?   ? ? ? ? ? ? ? ? ? ? ? ? PT Short Term Goals - 05/23/21 0953   ? ?  ? PT SHORT TERM GOAL #1  ? Title Will be independent with appropriate progressive HEP   ? Status Achieved   ? ?  ?  ? ?  ? ? ? ? PT Long Term Goals - 07/11/21 1055   ? ?  ? PT LONG TERM GOAL #1  ? Title MMT to improve by at least 1 grade in all weak groups to reduce pain and improve function   ? Status Partially Met   ?  ? PT LONG TERM GOAL #2  ? Title Will report pain no worse than 3/10 in order to improve QOL   ? Status Partially Met   ?  ? PT LONG TERM GOAL #3  ? Title Will be compliant with independent gym or group exercise based program in order to maintain functional gains and prevent recurrence of severe pain   ?  Status Partially Met   ? ?  ?  ? ?  ? ? ? ? ? ? ? ? Plan - 07/11/21 1055   ? ? Clinical Impression Statement Pt continues to progress towards goals. Tightness int shoulder and between scapulas noted with palpation. Pt has a forward rounded shoulder a rest postural cue needed thought session. Stiff mobility noted with weighted ball extensions and diagonals.   ? Personal Factors and Comorbidities Age;Fitness;Behavior Pattern;Past/Current Experience;Comorbidity 2;Time since onset of injury/illness/exacerbation;Social Background   ? Examination-Activity Limitations Engineer, drilling;Locomotion Level;Transfers;Bend;Sit;Carry;Squat;Stairs;Dressing;Stand;Lift   ? Examination-Participation Restrictions Cleaning;Occupation;Community Activity;Shop;Medication Management   ? Stability/Clinical Decision Making Stable/Uncomplicated   ? Rehab Potential Fair   ? PT Frequency 2x / week   ? PT Treatment/Interventions ADLs/Self Care Home Management;Cryotherapy;Electrical Stimulation;Iontophoresis 66m/ml Dexamethasone;Moist Heat;Ultrasound;Gait training;Stair training;Functional mobility training;Therapeutic activities;Therapeutic exercise;Balance training;Neuromuscular re-education;Patient/family education;Manual techniques;Passive range of  motion;Dry needling;Energy conservation;Taping;Spinal Manipulations   ? PT Next Visit Plan general function strengthening and postural training, cervial and thoracic ROM as tolerated   ? ?  ?  ? ?  ? ? ?Patient will benefit from skilled therapeutic intervention in order to improve the following deficits and impairments:  Decreased range of motion, Increased muscle spasms, Impaired UE functional use, Decreased activity tolerance, Impaired perceived functional ability, Pain, Hypomobility, Impaired flexibility, Improper body mechanics, Decreased mobility, Decreased strength, Postural dysfunction ? ?Visit Diagnosis: ?Pain in thoracic spine ? ?Muscle weakness (generalized) ? ?Cramp and spasm ? ?Abnormal posture ? ? ? ? ?Problem List ?Patient Active Problem List  ? Diagnosis Date Noted  ? OA (osteoarthritis) of knee 07/18/2019  ? Primary osteoarthritis of left knee 07/18/2019  ? ? ?RScot Jun PTA ?07/11/2021, 10:58 AM ? ?[facility] ?[address] ?[place] [address]?Phone: [phone]  Fax:  [phone]? ?Name: Vanessa Blair?MRN: [id number]?Date of Birth: 503/02/1951? ? ? ?

## 2021-07-13 ENCOUNTER — Other Ambulatory Visit: Payer: Self-pay

## 2021-07-13 ENCOUNTER — Emergency Department (HOSPITAL_BASED_OUTPATIENT_CLINIC_OR_DEPARTMENT_OTHER)
Admission: EM | Admit: 2021-07-13 | Discharge: 2021-07-13 | Disposition: A | Discharge status: Home / Self Care | Payer: Medicare Other | Attending: Emergency Medicine | Admitting: Emergency Medicine

## 2021-07-13 ENCOUNTER — Encounter (HOSPITAL_BASED_OUTPATIENT_CLINIC_OR_DEPARTMENT_OTHER): Payer: Self-pay

## 2021-07-13 DIAGNOSIS — J0101 Acute recurrent maxillary sinusitis: Secondary | ICD-10-CM | POA: Diagnosis not present

## 2021-07-13 DIAGNOSIS — R0981 Nasal congestion: Secondary | ICD-10-CM | POA: Diagnosis present

## 2021-07-13 DIAGNOSIS — Z79899 Other long term (current) drug therapy: Secondary | ICD-10-CM | POA: Diagnosis not present

## 2021-07-13 DIAGNOSIS — I10 Essential (primary) hypertension: Secondary | ICD-10-CM | POA: Insufficient documentation

## 2021-07-13 MED ORDER — AMOXICILLIN-POT CLAVULANATE 875-125 MG PO TABS: 1.0000 | ORAL_TABLET | Freq: Two times a day (BID) | ORAL | 0 refills | Status: AC

## 2021-07-13 NOTE — Discharge Instructions (Signed)
Please follow up with ENT that you have scheduled in May.  ?I have prescribed you a course of Augmentin for possible infection. Recommend continuing Flonase. Try sinus rinses daily to rinse out sinus.  ?

## 2021-07-13 NOTE — ED Triage Notes (Signed)
States having sinus issues, congestion and burning in nose. Recently on amoxicillin 3 weeks ago for sinus infection, symptoms improved but started again a week ago. ?

## 2021-07-13 NOTE — ED Provider Notes (Signed)
?[facility] EMERGENCY DEPARTMENT ?Provider Note ? ? ?CSN: [id number] ?Arrival date & time: 07/13/21  1350 ? ?  ? ?History ?PMH: HTN, HLD ?Chief Complaint  ?Patient presents with  ? Nasal Congestion  ? ? ?Vanessa Blair is a 71 y.o. female. ?Patient presents to the ED with a chief complaint of nasal congestion.  She was having sinus issues about 1 month ago.  She presented to the emergency department at that time.  She was seen by one of our doctors and placed on Augmentin for likely acute sinusitis.  Her symptoms improved while on Augmentin and she finished the course.  She was well for about a week and 1/2 to 2 weeks until she started recently having the nasal congestion again over the past 7 days.  She has been using Flonase daily and Sudafed daily without any relief.  She denies any fevers or chills, sore throat, eye drainage, eye redness, shortness of breath, or chest pain.  She is on daily Zyrtec for seasonal allergies.  This does not like her previous history of allergies.  She has a follow-up appointment with ENT in early May for congestion issues.  ? ?HPI ? ?  ? ?Home Medications ?Prior to Admission medications   ?Medication Sig Start Date End Date Taking? Authorizing Provider  ?amoxicillin-clavulanate (AUGMENTIN) 875-125 MG tablet Take 1 tablet by mouth 2 (two) times daily for 7 days. 07/13/21 07/20/21 Yes Deandre Stansel, Finis BudGrace C, PA-C  ?ALPRAZolam (XANAX) 0.25 MG tablet Take 0.25 mg by mouth at bedtime. 06/13/19   [provider]  ?calcium carbonate (OS-CAL - DOSED IN MG OF ELEMENTAL CALCIUM) 1250 (500 Ca) MG tablet Take 1 tablet by mouth daily with breakfast.    [provider]  ?cephALEXin (KEFLEX) 500 MG capsule Take 1 capsule (500 mg total) by mouth 2 (two) times daily. 08/05/20   Palumbo, April, MD  ?cholecalciferol (VITAMIN D3) 25 MCG (1000 UNIT) tablet Take 1,000 Units by mouth daily.    [provider]  ?esomeprazole (NEXIUM) 40 MG capsule Take 40 mg by mouth daily. 06/05/19    [provider]  ?fluticasone (FLONASE) 50 MCG/ACT nasal spray Place 1 spray into both nostrils daily as needed for allergies. 12/01/16   [provider]  ?gabapentin (NEURONTIN) 300 MG capsule Take 1 capsule (300 mg total) by mouth 3 (three) times daily. Take a 300 mg capsule three times a day for two weeks following surgery.Then take a 300 mg capsule two times a day for two weeks. Then take a 300 mg capsule once a day for two weeks. Then discontinue the Gabapentin. 07/20/19   Cassandria Angeronovan, Ashley R, PA-C  ?lisinopril-hydrochlorothiazide (ZESTORETIC) 20-25 MG tablet Take 1 tablet by mouth daily. 06/05/19   [provider]  ?meclizine (ANTIVERT) 25 MG tablet Take 1 tablet (25 mg total) by mouth 3 (three) times daily as needed for dizziness. 10/17/19   Melene PlanFloyd, Dan, DO  ?methocarbamol (ROBAXIN) 500 MG tablet Take 1 tablet (500 mg total) by mouth every 6 (six) hours as needed for muscle spasms. 07/20/19   Cassandria Angeronovan, Ashley R, PA-C  ?metoprolol tartrate (LOPRESSOR) 50 MG tablet Take 50 mg by mouth 2 (two) times daily. 06/05/19   [provider]  ?Multiple Vitamin (MULTIVITAMIN WITH MINERALS) TABS tablet Take 1 tablet by mouth daily.    [provider]  ?ondansetron (ZOFRAN) 4 MG tablet Take 1 tablet (4 mg total) by mouth every 6 (six) hours as needed for nausea. 07/19/19   Edmisten, Lyn HollingsheadKristie L, PA  ?  oxyCODONE (OXY IR/ROXICODONE) 5 MG immediate release tablet Take 1-2 tablets (5-10 mg total) by mouth every 6 (six) hours as needed for severe pain. 07/20/19   Cassandria Anger, PA-C  ?simvastatin (ZOCOR) 20 MG tablet Take 20 mg by mouth at bedtime. 02/08/19   [provider]  ?tobramycin (TOBREX) 0.3 % ophthalmic solution Place 1 drop into the left eye See admin instructions. Instill one drop into the left eye four times daily the day of and the day after eye injection 06/20/19   [provider]  ?traMADol (ULTRAM) 50 MG tablet Take 1-2 tablets (50-100 mg total) by mouth every 6  (six) hours as needed for moderate pain. 07/20/19   Cassandria Anger, PA-C  ?vitamin B-12 (CYANOCOBALAMIN) 1000 MCG tablet Take 1,000 mcg by mouth daily.    [provider]  ?   ? ?Allergies    ?Vicodin [hydrocodone-acetaminophen]   ? ?Review of Systems   ?Review of Systems  ?HENT:  Positive for congestion, sinus pressure and sinus pain. Negative for ear pain, rhinorrhea and sore throat.   ?Respiratory:  Negative for cough.   ?All other systems reviewed and are negative. ? ?Physical Exam ?Updated Vital Signs ?BP 111/71 (BP Location: Left Arm)   Pulse 67   Temp 97.6 ?F (36.4 ?C) (Oral)   Resp 18   Ht 5\' 9"  (1.753 m)   Wt 104.3 kg   SpO2 100%   BMI 33.97 kg/m?  ?Physical Exam ?Vitals and nursing note reviewed.  ?Constitutional:   ?   General: She is not in acute distress. ?   Appearance: Normal appearance. She is well-developed. She is not ill-appearing, toxic-appearing or diaphoretic.  ?HENT:  ?   Head: Normocephalic and atraumatic.  ?   Right Ear: Tympanic membrane, ear canal and external ear normal. There is no impacted cerumen. Tympanic membrane is not bulging.  ?   Left Ear: Tympanic membrane, ear canal and external ear normal. There is no impacted cerumen. Tympanic membrane is not bulging.  ?   Nose: Congestion present. No nasal deformity.  ?   Right Sinus: Maxillary sinus tenderness and frontal sinus tenderness present.  ?   Left Sinus: Maxillary sinus tenderness and frontal sinus tenderness present.  ?   Mouth/Throat:  ?   Lips: Pink. No lesions.  ?   Mouth: Mucous membranes are moist.  ?   Pharynx: Oropharynx is clear. No oropharyngeal exudate or posterior oropharyngeal erythema.  ?Eyes:  ?   General: Gaze aligned appropriately. No scleral icterus.    ?   Right eye: No discharge.     ?   Left eye: No discharge.  ?   Conjunctiva/sclera: Conjunctivae normal.  ?   Right eye: Right conjunctiva is not injected. No exudate or hemorrhage. ?   Left eye: Left conjunctiva is not injected. No exudate or  hemorrhage. ?Cardiovascular:  ?   Rate and Rhythm: Normal rate and regular rhythm.  ?   Pulses: Normal pulses.  ?   Heart sounds: Normal heart sounds. No murmur heard. ?  No friction rub. No gallop.  ?Pulmonary:  ?   Effort: Pulmonary effort is normal. No respiratory distress.  ?   Breath sounds: Normal breath sounds. No stridor. No wheezing, rhonchi or rales.  ?Chest:  ?   Chest wall: No tenderness.  ?Abdominal:  ?   General: Abdomen is flat. There is no distension.  ?   Palpations: Abdomen is soft.  ?   Tenderness: There is no  abdominal tenderness. There is no guarding or rebound.  ?Musculoskeletal:  ?   Cervical back: Neck supple.  ?Lymphadenopathy:  ?   Cervical: No cervical adenopathy.  ?Skin: ?   General: Skin is warm and dry.  ?Neurological:  ?   Mental Status: She is alert and oriented to person, place, and time.  ?Psychiatric:     ?   Mood and Affect: Mood normal.     ?   Speech: Speech normal.     ?   Behavior: Behavior normal. Behavior is cooperative.  ? ? ?ED Results / Procedures / Treatments   ?Labs ?(all labs ordered are listed, but only abnormal results are displayed) ?Labs Reviewed - No data to display ? ?EKG ?None ? ?Radiology ?No results found. ? ?Procedures ?Procedures  ? ? ?Medications Ordered in ED ?Medications - No data to display ? ?ED Course/ Medical Decision Making/ A&P ?  ? ?                        ?Medical Decision Making ?Risk ?Prescription drug management. ? ? ? ?MDM  ?This is a 71 y.o. female who presents to the ED with sinus congestion ?The differential of this patient includes but is not limited to URI, Sinusitis, seasonal allergies ? ?My Impression, Plan, and ED Course: Well appearing. Normal vitals. This is an acute sinusitis recurrence after completing abx therapy. She has had 7-10 days worth of symptoms with no improvement while taking flonase or sudafed. She stopped taking afrin after 5 days during last time having infection. Doubt that this is from rebound use. She is also  already on appropriate therapy for allergic rhinitis and this is different presentation from typical. Since she is not improving with our typical supportive measures of sinusitis, will prescribe another course of

## 2021-07-23 ENCOUNTER — Ambulatory Visit: Payer: Medicare Other | Admitting: Physical Therapy

## 2021-07-23 ENCOUNTER — Encounter: Payer: Self-pay | Admitting: Physical Therapy

## 2021-07-23 DIAGNOSIS — R293 Abnormal posture: Secondary | ICD-10-CM

## 2021-07-23 DIAGNOSIS — R252 Cramp and spasm: Secondary | ICD-10-CM

## 2021-07-23 DIAGNOSIS — M6281 Muscle weakness (generalized): Secondary | ICD-10-CM

## 2021-07-23 DIAGNOSIS — M546 Pain in thoracic spine: Secondary | ICD-10-CM | POA: Diagnosis not present

## 2021-07-23 NOTE — Therapy (Signed)
[facility] ?[place] [address] ?[place], [place], [address] ?Phone: [phone]   Fax:  [phone] ? ?Physical Therapy Treatment ? ?Patient Details  ?Name: Vanessa Blair ?MRN: [id number] ?Date of Birth: 1950-11-20 ?Referring Provider (PT): Dahari Rolena Infante ? ? ?Encounter Date: 07/23/2021 ? ? PT End of Session - 07/23/21 1222   ? ? Visit Number 13   ? Date for PT Re-Evaluation 08/10/21   ? PT Start Time 1154   ? PT Stop Time 1230   ? PT Time Calculation (min) 36 min   ? Activity Tolerance Patient tolerated treatment well   ? Behavior During Therapy [facility] for tasks assessed/performed   ? ?  ?  ? ?  ? ? ?Past Medical History:  ?Diagnosis Date  ? Anemia   ? Arthritis   ? Hypercholesteremia   ? Hypertension   ? ? ?Past Surgical History:  ?Procedure Laterality Date  ? BREAST SURGERY    ? TOTAL HIP ARTHROPLASTY    ? 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 Procedure: TOTAL KNEE ARTHROPLASTY;  Surgeon: Gaynelle Arabian, MD;  Location: [facility];  Service: Orthopedics;  Laterality: Left;  ? TUBAL LIGATION    ? ? ?There were no vitals filed for this visit. ? ? Subjective Assessment - 07/23/21 1154   ? ? Subjective feeling ok, been having issues with sinuses   ? Currently in Pain? Yes   ? Pain Score 2    ? Pain Location Back   ? ?  ?  ? ?  ? ? ? ? ? ? ? ? ? ? ? ? ? ? ? ? ? ? ? ? [facility] Adult PT Treatment/Exercise - 07/23/21 0001   ? ?  ? Lumbar Exercises: Aerobic  ? UBE (Upper Arm Bike) L2  x2 min each way   ? Nustep L5 x 6 min   ?  ? Lumbar Exercises: Machines for Strengthening  ? Other Lumbar Machine Exercise Rows 20lb Lats 20lb 2x10   ?  ? Lumbar Exercises: Standing  ? Shoulder Extension Strengthening;Both;20 reps;Power Tower   ? Shoulder Extension Limitations 10   ?  ? Lumbar Exercises: Seated  ? Sit to Stand 10 reps   x2 OHP yellow ball  ?  ? Shoulder Exercises: Seated  ? Horizontal ABduction Strengthening;Both;20 reps;Theraband   ? Theraband Level (Shoulder Horizontal ABduction) Level  3 (Green)   ?  ? Shoulder Exercises: Standing  ? Other Standing Exercises ER red 2x10   ? ?  ?  ? ?  ? ? ? ? ? ? ? ? ? ? ? ? PT Short Term Goals - 05/23/21 0953   ? ?  ? PT SHORT TERM GOAL #1  ? Title Will be independent with appropriate progressive HEP   ? Status Achieved   ? ?  ?  ? ?  ? ? ? ? PT Long Term Goals - 07/11/21 1055   ? ?  ? PT LONG TERM GOAL #1  ? Title MMT to improve by at least 1 grade in all weak groups to reduce pain and improve function   ? Status Partially Met   ?  ? PT LONG TERM GOAL #2  ? Title Will report pain no worse than 3/10 in order to improve QOL   ? Status Partially Met   ?  ? PT LONG TERM GOAL #3  ? Title Will be compliant with independent gym or group exercise based program in order to  maintain functional gains and prevent recurrence of severe pain   ? Status Partially Met   ? ?  ?  ? ?  ? ? ? ? ? ? ? ? Plan - 07/23/21 1223   ? ? Clinical Impression Statement PT ~ 9 minutes late for today's session. Pt enters clinic with 2/10 pain rating that's he said was mostly stiffness. Decrease in stiffness reported as session progressed. Pt stated she has been walking some, Postural cues needed with seated rows and horiz abduction. Good strength noted with shoulder extensions. No reports of increase pain.   ? Personal Factors and Comorbidities Age;Fitness;Behavior Pattern;Past/Current Experience;Comorbidity 2;Time since onset of injury/illness/exacerbation;Social Background   ? Examination-Activity Limitations Engineer, drilling;Locomotion Level;Transfers;Bend;Sit;Carry;Squat;Stairs;Dressing;Stand;Lift   ? Examination-Participation Restrictions Cleaning;Occupation;Community Activity;Shop;Medication Management   ? Stability/Clinical Decision Making Stable/Uncomplicated   ? PT Duration 2 weeks   ? PT Treatment/Interventions ADLs/Self Care Home Management;Cryotherapy;Electrical Stimulation;Iontophoresis 4mg /ml Dexamethasone;Moist Heat;Ultrasound;Gait training;Stair training;Functional mobility  training;Therapeutic activities;Therapeutic exercise;Balance training;Neuromuscular re-education;Patient/family education;Manual techniques;Passive range of motion;Dry needling;Energy conservation;Taping;Spinal Manipulations   ? PT Next Visit Plan general function strengthening and postural training, cervial and thoracic ROM as tolerated   ? ?  ?  ? ?  ? ? ?Patient will benefit from skilled therapeutic intervention in order to improve the following deficits and impairments:  Decreased range of motion, Increased muscle spasms, Impaired UE functional use, Decreased activity tolerance, Impaired perceived functional ability, Pain, Hypomobility, Impaired flexibility, Improper body mechanics, Decreased mobility, Decreased strength, Postural dysfunction ? ?Visit Diagnosis: ?Pain in thoracic spine ? ?Muscle weakness (generalized) ? ?Cramp and spasm ? ?Abnormal posture ? ? ? ? ?Problem List ?Patient Active Problem List  ? Diagnosis Date Noted  ? OA (osteoarthritis) of knee 07/18/2019  ? Primary osteoarthritis of left knee 07/18/2019  ? ? ?Scot Jun, PTA ?07/23/2021, 12:26 PM ? ?[facility] ?[address] ?[place], [place], [address] ?Phone: [phone]   Fax:  [phone] ? ?Name: Vanessa Blair ?MRN: [id number] ?Date of Birth: June 27, 1950 ? ? ? ?

## 2021-07-25 ENCOUNTER — Ambulatory Visit: Payer: Medicare Other | Admitting: Physical Therapy

## 2021-07-25 ENCOUNTER — Encounter: Payer: Self-pay | Admitting: Physical Therapy

## 2021-07-25 DIAGNOSIS — M546 Pain in thoracic spine: Secondary | ICD-10-CM | POA: Diagnosis not present

## 2021-07-25 DIAGNOSIS — M6281 Muscle weakness (generalized): Secondary | ICD-10-CM

## 2021-07-25 DIAGNOSIS — R293 Abnormal posture: Secondary | ICD-10-CM

## 2021-07-25 NOTE — Therapy (Signed)
[place] ?[place] [address] ?[place], [place], [address] ?Phone: [phone]   Fax:  [phone] ? ?Physical Therapy Treatment ? ?Patient Details  ?Name: Vanessa Blair ?MRN: [id number] ?Date of Birth: [birth year] ?Referring Provider (PT): Dahari Rolena Infante ? ? ?Encounter Date: 07/25/2021 ? ? PT End of Session - 07/25/21 1226   ? ? Visit Number 14   ? Number of Visits 9   ? Date for PT Re-Evaluation 08/10/21   ? Authorization Type UHC   ? Authorization Time Period 05/09/21 to 06/06/21   ? PT Start Time 1152   Patient was 7 mins late  ? PT Stop Time 1225   ? PT Time Calculation (min) 33 min   ? Activity Tolerance Patient tolerated treatment well   ? Behavior During Therapy [facility] for tasks assessed/performed   ? ?  ?  ? ?  ? ? ?Past Medical History:  ?Diagnosis Date  ? Anemia   ? Arthritis   ? Hypercholesteremia   ? Hypertension   ? ? ?Past Surgical History:  ?Procedure Laterality Date  ? BREAST SURGERY    ? TOTAL HIP ARTHROPLASTY    ? 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 Procedure: TOTAL KNEE ARTHROPLASTY;  Surgeon: Gaynelle Arabian, MD;  Location: [facility];  Service: Orthopedics;  Laterality: Left;  ? TUBAL LIGATION    ? ? ?There were no vitals filed for this visit. ? ? Subjective Assessment - 07/25/21 1153   ? ? Subjective I'm doing okay, I can't clean like I used to I have to take breaks.   ? Patient Stated Goals get my back feeling better   ? Currently in Pain? Yes   ? Pain Score 3    ? Pain Location Back   ? ?  ?  ? ?  ? ? ? ? ? ? ? ? ? ? ? ? ? ? ? ? ? ? ? ? [place] Adult PT Treatment/Exercise - 07/25/21 0001   ? ?  ? Lumbar Exercises: Aerobic  ? Recumbent Bike lvl 2 x 5 mins   ?  ? Lumbar Exercises: Machines for Strengthening  ? Other Lumbar Machine Exercise rows and lat pull downs 25# 2x10   ? Other Lumbar Machine Exercise trunk rotation both sides 2x10 10#   ?  ? Lumbar Exercises: Standing  ? Other Standing Lumbar Exercises overhead flex w/ blue weight ball 2x10   ?  Other Standing Lumbar Exercises hip ext 10 reps red TB   ?  ? Lumbar Exercises: Seated  ? Sit to Stand 10 reps   ? Sit to Stand Limitations 2 sets on airex, LOB x1   ?  ? Shoulder Exercises: Power Tower  ? Extension 5 reps   ? Extension Limitations 2 sets 35#   ? ?  ?  ? ?  ? ? ? ? ? ? ? ? ? ? ? ? PT Short Term Goals - 05/23/21 0953   ? ?  ? PT SHORT TERM GOAL #1  ? Title Will be independent with appropriate progressive HEP   ? Status Achieved   ? ?  ?  ? ?  ? ? ? ? PT Long Term Goals - 07/11/21 1055   ? ?  ? PT LONG TERM GOAL #1  ? Title MMT to improve by at least 1 grade in all weak groups to reduce pain and improve function   ? Status Partially Met   ?  ? PT  LONG TERM GOAL #2  ? Title Will report pain no worse than 3/10 in order to improve QOL   ? Status Partially Met   ?  ? PT LONG TERM GOAL #3  ? Title Will be compliant with independent gym or group exercise based program in order to maintain functional gains and prevent recurrence of severe pain   ? Status Partially Met   ? ?  ?  ? ?  ? ? ? ? ? ? ? ? Plan - 07/25/21 1228   ? ? Clinical Impression Statement Patient came in feeling okay. Worked on strengthening back for today's session. She stated she did not like the recumbent bike, it caused her L knee pain. LOB w/ STS on airex once but recovered and did well afterwards. VC's needed for correct posture w/ hip ext and trunk rotation on power tower.   ? Personal Factors and Comorbidities Age;Fitness;Behavior Pattern;Past/Current Experience;Comorbidity 2;Time since onset of injury/illness/exacerbation;Social Background   ? Examination-Activity Limitations Engineer, drilling;Locomotion Level;Transfers;Bend;Sit;Carry;Squat;Stairs;Dressing;Stand;Lift   ? Examination-Participation Restrictions Cleaning;Occupation;Community Activity;Shop;Medication Management   ? Stability/Clinical Decision Making Stable/Uncomplicated   ? Clinical Decision Making Low   ? Rehab Potential Fair   ? PT Frequency 2x / week   ? PT  Duration 2 weeks   ? PT Treatment/Interventions ADLs/Self Care Home Management;Cryotherapy;Electrical Stimulation;Iontophoresis 47m/ml Dexamethasone;Moist Heat;Ultrasound;Gait training;Stair training;Functional mobility training;Therapeutic activities;Therapeutic exercise;Balance training;Neuromuscular re-education;Patient/family education;Manual techniques;Passive range of motion;Dry needling;Energy conservation;Taping;Spinal Manipulations   ? PT Next Visit Plan progress as tolerated   ? PT [place]  ? Consulted and Agree with Plan of Care Patient   ? ?  ?  ? ?  ? ? ?Patient will benefit from skilled therapeutic intervention in order to improve the following deficits and impairments:  Decreased range of motion, Increased muscle spasms, Impaired UE functional use, Decreased activity tolerance, Impaired perceived functional ability, Pain, Hypomobility, Impaired flexibility, Improper body mechanics, Decreased mobility, Decreased strength, Postural dysfunction ? ?Visit Diagnosis: ?Pain in thoracic spine ? ?Muscle weakness (generalized) ? ?Abnormal posture ? ? ? ? ?Problem List ?Patient Active Problem List  ? Diagnosis Date Noted  ? OA (osteoarthritis) of knee 07/18/2019  ? Primary osteoarthritis of left knee 07/18/2019  ? ? ?Cartha Rotert PChauncey Cruel?07/25/2021, 12:32 PM ? ?[place] ?[address] ?[place] [address]?Phone: [phone]  Fax:  [phone]? ?Name: Vanessa Blair?MRN: [id number]?Date of Birth: [birth year]? ? ? ?

## 2021-07-30 ENCOUNTER — Ambulatory Visit: Payer: Medicare Other | Admitting: Physical Therapy

## 2021-08-01 ENCOUNTER — Ambulatory Visit: Payer: Medicare Other | Admitting: Physical Therapy

## 2021-08-01 DIAGNOSIS — M546 Pain in thoracic spine: Secondary | ICD-10-CM | POA: Diagnosis not present

## 2021-08-01 DIAGNOSIS — M6281 Muscle weakness (generalized): Secondary | ICD-10-CM

## 2021-08-01 DIAGNOSIS — R293 Abnormal posture: Secondary | ICD-10-CM

## 2021-08-01 NOTE — Therapy (Signed)
[place] ?[place] [address] ?[place], [place], [address] ?Phone: [phone]   Fax:  [phone] ? ?Physical Therapy Treatment ? ?Patient Details  ?Name: Vanessa Blair ?MRN: [id number] ?Date of Birth: 10/02/1950 ?Referring Provider (PT): Dahari Rolena Infante ? ? ?Encounter Date: 08/01/2021 ? ? PT End of Session - 08/01/21 1226   ? ? Visit Number 15   ? PT Start Time 1151   ? PT Stop Time 1225   ? PT Time Calculation (min) 34 min   ? Activity Tolerance Patient tolerated treatment well   ? Behavior During Therapy [facility] for tasks assessed/performed   ? ?  ?  ? ?  ? ? ?Past Medical History:  ?Diagnosis Date  ? Anemia   ? Arthritis   ? Hypercholesteremia   ? Hypertension   ? ? ?Past Surgical History:  ?Procedure Laterality Date  ? BREAST SURGERY    ? TOTAL HIP ARTHROPLASTY    ? 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 Procedure: TOTAL KNEE ARTHROPLASTY;  Surgeon: Gaynelle Arabian, MD;  Location: [facility];  Service: Orthopedics;  Laterality: Left;  ? TUBAL LIGATION    ? ? ?There were no vitals filed for this visit. ? ? Subjective Assessment - 08/01/21 1152   ? ? Subjective I feel better dealing with sinuses lately. Has a ENT visit soon. Some soreness in her back   ? Currently in Pain? Yes   ? Pain Score 3    ? Pain Location Back   ? ?  ?  ? ?  ? ? ? ? ? ? ? ? ? ? ? ? ? ? ? ? ? ? ? ? [employer] Adult PT Treatment/Exercise - 08/01/21 0001   ? ?  ? Lumbar Exercises: Aerobic  ? UBE (Upper Arm Bike) L2.5  x3 min each way   ? Nustep L5 x 4 min   ?  ? Lumbar Exercises: Machines for Strengthening  ? Other Lumbar Machine Exercise rows 2x15 and lat pull downs 25# 2x10   ?  ? Lumbar Exercises: Standing  ? Shoulder Extension Strengthening;Both;20 reps;Power Tower   ? Shoulder Extension Limitations 10   ? Other Standing Lumbar Exercises hip ext 10 reps red TB   ?  ? Lumbar Exercises: Seated  ? Sit to Stand 10 reps   x2 holding yellow ball  ? ?  ?  ? ?  ? ? ? ? ? ? ? ? ? ? ? ? PT Short Term Goals -  05/23/21 0953   ? ?  ? PT SHORT TERM GOAL #1  ? Title Will be independent with appropriate progressive HEP   ? Status Achieved   ? ?  ?  ? ?  ? ? ? ? PT Long Term Goals - 07/25/21 1354   ? ?  ? PT LONG TERM GOAL #1  ? Title MMT to improve by at least 1 grade in all weak groups to reduce pain and improve function   ? Status Partially Met   ?  ? PT LONG TERM GOAL #2  ? Title Will report pain no worse than 3/10 in order to improve QOL   ? Baseline varies with day and actvitity   ? Status Partially Met   ?  ? PT LONG TERM GOAL #3  ? Title Will be compliant with independent gym or group exercise based program in order to maintain functional gains and prevent recurrence of severe pain   ? Status Partially  Met   ? ?  ?  ? ?  ? ? ? ? ? ? ? ? Plan - 08/01/21 1226   ? ? Clinical Impression Statement Pt ~ 6 minutes late for today's session with reports of sinus issues. Pt able to complete all interventions but required cues to prevent postural sway with seated rows. Pt also required tactile cue to prevent forward trunk flex with hip extensions. No reports of increase pain with activity   ? Personal Factors and Comorbidities Age;Fitness;Behavior Pattern;Past/Current Experience;Comorbidity 2;Time since onset of injury/illness/exacerbation;Social Background   ? Examination-Activity Limitations Engineer, drilling;Locomotion Level;Transfers;Bend;Sit;Carry;Squat;Stairs;Dressing;Stand;Lift   ? Examination-Participation Restrictions Cleaning;Occupation;Community Activity;Shop;Medication Management   ? Stability/Clinical Decision Making Stable/Uncomplicated   ? Rehab Potential Fair   ? PT Frequency 2x / week   ? PT Duration 2 weeks   ? PT Treatment/Interventions ADLs/Self Care Home Management;Cryotherapy;Electrical Stimulation;Iontophoresis 33m/ml Dexamethasone;Moist Heat;Ultrasound;Gait training;Stair training;Functional mobility training;Therapeutic activities;Therapeutic exercise;Balance training;Neuromuscular  re-education;Patient/family education;Manual techniques;Passive range of motion;Dry needling;Energy conservation;Taping;Spinal Manipulations   ? PT Next Visit Plan progress as tolerated   ? ?  ?  ? ?  ? ? ?Patient will benefit from skilled therapeutic intervention in order to improve the following deficits and impairments:    ? ?Visit Diagnosis: ?Pain in thoracic spine ? ?Abnormal posture ? ?Muscle weakness (generalized) ? ? ? ? ?Problem List ?Patient Active Problem List  ? Diagnosis Date Noted  ? OA (osteoarthritis) of knee 07/18/2019  ? Primary osteoarthritis of left knee 07/18/2019  ? ? ?RScot Jun PTA ?08/01/2021, 12:28 PM ? ?[place] ?[address] ?[place] [address]?Phone: [phone]  Fax:  [phone]? ?Name: Vanessa Blair?MRN: [id number]?Date of Birth: 530-Jan-1952? ? ? ?

## 2021-08-06 ENCOUNTER — Ambulatory Visit: Payer: Medicare Other | Admitting: Physical Therapy

## 2021-08-08 ENCOUNTER — Ambulatory Visit: Payer: Medicare Other | Admitting: Physical Therapy

## 2021-08-13 ENCOUNTER — Encounter: Payer: Self-pay | Admitting: Physical Therapy

## 2021-08-13 ENCOUNTER — Ambulatory Visit: Payer: Medicare Other | Attending: Orthopedic Surgery | Admitting: Physical Therapy

## 2021-08-13 DIAGNOSIS — M546 Pain in thoracic spine: Secondary | ICD-10-CM | POA: Insufficient documentation

## 2021-08-13 DIAGNOSIS — R293 Abnormal posture: Secondary | ICD-10-CM | POA: Diagnosis present

## 2021-08-13 DIAGNOSIS — M6281 Muscle weakness (generalized): Secondary | ICD-10-CM | POA: Diagnosis present

## 2021-08-13 NOTE — Therapy (Signed)
[place] ?[place] ?[address] ?[place], [place], [address] ?Phone: [phone]   Fax:  [phone] ? ?Physical Therapy Treatment ? ?Patient Details  ?Name: Vanessa Blair ?MRN: [id number] ?Date of Birth: December 06, 1950 ?Referring Provider (PT): Dahari Rolena Infante ? ? ?Encounter Date: 08/13/2021 ? ? PT End of Session - 08/13/21 1012   ? ? Visit Number 16   ? Date for PT Re-Evaluation 08/10/21   ? PT Start Time 1497   ? PT Stop Time 1015   ? PT Time Calculation (min) 43 min   ? Activity Tolerance Patient tolerated treatment well   ? Behavior During Therapy [facility] for tasks assessed/performed   ? ?  ?  ? ?  ? ? ?Past Medical History:  ?Diagnosis Date  ? Anemia   ? Arthritis   ? Hypercholesteremia   ? Hypertension   ? ? ?Past Surgical History:  ?Procedure Laterality Date  ? BREAST SURGERY    ? TOTAL HIP ARTHROPLASTY    ? 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 Procedure: TOTAL KNEE ARTHROPLASTY;  Surgeon: Gaynelle Arabian, MD;  Location: [facility];  Service: Orthopedics;  Laterality: Left;  ? TUBAL LIGATION    ? ? ?There were no vitals filed for this visit. ? ? Subjective Assessment - 08/13/21 0935   ? ? Subjective Feeling ok a little stiff. Been going back and forth to the MD dealing with her sinuses   ? Currently in Pain? No/denies   ? ?  ?  ? ?  ? ? ? ? ? [facility] PT Assessment - 08/13/21 0001   ? ?  ? AROM  ? Cervical Flexion WNL   ? Cervical Extension WNL   ? Cervical - Right Side Bend 25% limited   ? Cervical - Left Side [facility]   ? Cervical - Right Rotation WFL   ? Cervical - Left Rotation WFL   ? Thoracic Flexion WFL   ? Thoracic Extension WFL   ? Thoracic - Right Side [facility]   ? Thoracic - Left Side [facility]   ? Thoracic - Right Rotation WFL   ? Thoracic - Left Rotation WFL   ?  ? Strength  ? Right Shoulder Flexion 4+/5   ? Right Shoulder ABduction 4/5   ? Right Shoulder External Rotation 4/5   ? Left Shoulder Flexion 4+/5   ? Left Shoulder ABduction 4/5   ? Left Shoulder External  Rotation 4/5   ? ?  ?  ? ?  ? ? ? ? ? ? ? ? ? ? ? ? ? ? ? ? [facility] Adult PT Treatment/Exercise - 08/13/21 0001   ? ?  ? Lumbar Exercises: Aerobic  ? UBE (Upper Arm Bike) L2.5  x2 min each way   ? Nustep L5 x 6 min   ?  ? Lumbar Exercises: Machines for Strengthening  ? Other Lumbar Machine Exercise rows 2x15 and lat pull downs 15# 2x10   ?  ? Lumbar Exercises: Standing  ? Row Strengthening;Power tower;Both;20 reps   ? Row Limitations 10   then 5lb  ? Shoulder Extension Strengthening;Both;20 reps;Power Tower   ? Shoulder Extension Limitations 10   ?  ? Lumbar Exercises: Seated  ? Sit to Stand 10 reps   x2 OHP yellow ball  ?  ? Shoulder Exercises: Seated  ? External Rotation Strengthening;Both;20 reps;Theraband   ? Theraband Level (Shoulder External Rotation) Level 2 (Red)   ? ?  ?  ? ?  ? ? ? ? ? ? ? ? ? ? ? ?  PT Short Term Goals - 08/13/21 1013   ? ?  ? PT SHORT TERM GOAL #1  ? Title Will be independent with appropriate progressive HEP   ? Status Achieved   ? ?  ?  ? ?  ? ? ? ? PT Long Term Goals - 08/13/21 1013   ? ?  ? PT LONG TERM GOAL #1  ? Title MMT to improve by at least 1 grade in all weak groups to reduce pain and improve function   ? Status Achieved   ?  ? PT LONG TERM GOAL #2  ? Title Will report pain no worse than 3/10 in order to improve QOL   ? Status Achieved   ?  ? PT LONG TERM GOAL #3  ? Title Will be compliant with independent gym or group exercise based program in order to maintain functional gains and prevent recurrence of severe pain   ? Status Achieved   ? ?  ?  ? ?  ? ? ? ? ? ? ? ? Plan - 08/13/21 1013   ? ? Clinical Impression Statement Pt enters feeling reporting no pain just stiffness. She has not been able to consistently attend therapy due to other health issues. Pt has progressed meeting all goals. Encourage pt to stay active on her own. She reports no functional limitations.   ? Personal Factors and Comorbidities Age;Fitness;Behavior Pattern;Past/Current Experience;Comorbidity 2;Time since  onset of injury/illness/exacerbation;Social Background   ? Examination-Activity Limitations Engineer, drilling;Locomotion Level;Transfers;Bend;Sit;Carry;Squat;Stairs;Dressing;Stand;Lift   ? Examination-Participation Restrictions Cleaning;Occupation;Community Activity;Shop;Medication Management   ? Stability/Clinical Decision Making Stable/Uncomplicated   ? Rehab Potential Fair   ? PT Frequency 2x / week   ? PT Duration 2 weeks   ? PT Treatment/Interventions ADLs/Self Care Home Management;Cryotherapy;Electrical Stimulation;Iontophoresis 4mg /ml Dexamethasone;Moist Heat;Ultrasound;Gait training;Stair training;Functional mobility training;Therapeutic activities;Therapeutic exercise;Balance training;Neuromuscular re-education;Patient/family education;Manual techniques;Passive range of motion;Dry needling;Energy conservation;Taping;Spinal Manipulations   ? PT Next Visit Plan D/C PT   ? ?  ?  ? ?  ? ? ?Patient will benefit from skilled therapeutic intervention in order to improve the following deficits and impairments:  Decreased range of motion, Increased muscle spasms, Impaired UE functional use, Decreased activity tolerance, Impaired perceived functional ability, Pain, Hypomobility, Impaired flexibility, Improper body mechanics, Decreased mobility, Decreased strength, Postural dysfunction ? ?Visit Diagnosis: ?Pain in thoracic spine ? ?Abnormal posture ? ?Muscle weakness (generalized) ? ? ? ? ?Problem List ?Patient Active Problem List  ? Diagnosis Date Noted  ? OA (osteoarthritis) of knee 07/18/2019  ? Primary osteoarthritis of left knee 07/18/2019  ? ?PHYSICAL THERAPY DISCHARGE SUMMARY ? ?Visits from Start of Care: 16 ? ?Patient agrees to discharge. Patient goals were met. Patient is being discharged due to meeting the stated rehab goals. ? ? ?Scot Jun, PTA ?08/13/2021, 10:15 AM ? ?[place] [address] ?[place], [place], [address] ?Phone: [phone]    Fax:  [phone] ? ?Name: Vanessa Blair ?MRN: [id number] ?Date of Birth: 07-17-1950 ? ? ? ?

## 2021-08-16 ENCOUNTER — Ambulatory Visit: Payer: Medicare Other | Admitting: Physical Therapy

## 2021-08-20 ENCOUNTER — Ambulatory Visit: Payer: Medicare Other | Admitting: Physical Therapy

## 2021-08-23 ENCOUNTER — Ambulatory Visit: Payer: Medicare Other | Admitting: Physical Therapy

## 2021-08-27 ENCOUNTER — Ambulatory Visit: Payer: Medicare Other | Admitting: Physical Therapy

## 2021-08-29 ENCOUNTER — Ambulatory Visit: Payer: Medicare Other | Admitting: Physical Therapy

## 2021-12-04 ENCOUNTER — Emergency Department (HOSPITAL_BASED_OUTPATIENT_CLINIC_OR_DEPARTMENT_OTHER): Payer: Medicare Other

## 2021-12-04 ENCOUNTER — Encounter (HOSPITAL_BASED_OUTPATIENT_CLINIC_OR_DEPARTMENT_OTHER): Payer: Self-pay | Admitting: Emergency Medicine

## 2021-12-04 ENCOUNTER — Emergency Department (HOSPITAL_BASED_OUTPATIENT_CLINIC_OR_DEPARTMENT_OTHER)
Admission: EM | Admit: 2021-12-04 | Discharge: 2021-12-04 | Disposition: A | Discharge status: Home / Self Care | Payer: Medicare Other | Attending: Emergency Medicine | Admitting: Emergency Medicine

## 2021-12-04 ENCOUNTER — Other Ambulatory Visit: Payer: Self-pay

## 2021-12-04 DIAGNOSIS — F410 Panic disorder [episodic paroxysmal anxiety] without agoraphobia: Secondary | ICD-10-CM | POA: Diagnosis not present

## 2021-12-04 DIAGNOSIS — D649 Anemia, unspecified: Secondary | ICD-10-CM | POA: Diagnosis not present

## 2021-12-04 DIAGNOSIS — F419 Anxiety disorder, unspecified: Secondary | ICD-10-CM | POA: Diagnosis present

## 2021-12-04 LAB — CBC WITH DIFFERENTIAL/PLATELET
Abs Immature Granulocytes: 0.01 10*3/uL (ref 0.00–0.07)
Basophils Absolute: 0 10*3/uL (ref 0.0–0.1)
Basophils Relative: 1 %
Eosinophils Absolute: 0.1 10*3/uL (ref 0.0–0.5)
Eosinophils Relative: 2 %
HCT: 35.4 % — ABNORMAL LOW (ref 36.0–46.0)
Hemoglobin: 11.5 g/dL — ABNORMAL LOW (ref 12.0–15.0)
Immature Granulocytes: 0 %
Lymphocytes Relative: 34 %
Lymphs Abs: 1.9 10*3/uL (ref 0.7–4.0)
MCH: 30.3 pg (ref 26.0–34.0)
MCHC: 32.5 g/dL (ref 30.0–36.0)
MCV: 93.2 fL (ref 80.0–100.0)
Monocytes Absolute: 0.5 10*3/uL (ref 0.1–1.0)
Monocytes Relative: 9 %
Neutro Abs: 3.1 10*3/uL (ref 1.7–7.7)
Neutrophils Relative %: 54 %
Platelets: 257 10*3/uL (ref 150–400)
RBC: 3.8 MIL/uL — ABNORMAL LOW (ref 3.87–5.11)
RDW: 12.8 % (ref 11.5–15.5)
WBC: 5.6 10*3/uL (ref 4.0–10.5)
nRBC: 0 % (ref 0.0–0.2)

## 2021-12-04 LAB — BASIC METABOLIC PANEL
Anion gap: 9 (ref 5–15)
BUN: 21 mg/dL (ref 8–23)
CO2: 26 mmol/L (ref 22–32)
Calcium: 9.4 mg/dL (ref 8.9–10.3)
Chloride: 107 mmol/L (ref 98–111)
Creatinine, Ser: 0.91 mg/dL (ref 0.44–1.00)
GFR, Estimated: >60 mL/min (ref >60)
Glucose, Bld: 106 mg/dL — ABNORMAL HIGH (ref 70–99)
Potassium: 3.3 mmol/L — ABNORMAL LOW (ref 3.5–5.1)
Sodium: 142 mmol/L (ref 135–145)

## 2021-12-04 MED ORDER — ALPRAZOLAM 0.5 MG PO TABS
0.2500 mg | ORAL_TABLET | Freq: Once | ORAL | Status: AC
  Administered 2021-12-04: 0.25 mg via ORAL
  Filled 2021-12-04: qty 1

## 2021-12-04 NOTE — ED Triage Notes (Signed)
Pt states woke up "real nervous inside" gasping for breath and felt bad. States normally take xanax but due to pharmacy issue is out.

## 2021-12-04 NOTE — ED Provider Notes (Signed)
[facility] EMERGENCY DEPARTMENT  Provider Note  CSN: [id number] Arrival date & time: 12/04/21 0216  History Chief Complaint  Patient presents with   Anxiety    Vanessa Blair is a 71 y.o. female with history of anxiety and insomnia typically takes a single xanax to help her sleep at night but ran out 2 nights ago. She was unable to get a refill due to a mix up with her PCP who sent the refill to [facility] mail in instead of to the local pharmacy and it had not arrived yet. She reports she woke up during the night suddenly feeling very SOB, shaking inside and nervous. She is feeling better now but was concerned enough to call a friend to bring her to the ED for evaluation.    Home Medications Prior to Admission medications   Medication Sig Start Date End Date Taking? Authorizing Provider  ALPRAZolam (XANAX) 0.25 MG tablet Take 0.25 mg by mouth at bedtime. 06/13/19   [provider]  calcium carbonate (OS-CAL - DOSED IN MG OF ELEMENTAL CALCIUM) 1250 (500 Ca) MG tablet Take 1 tablet by mouth daily with breakfast.    [provider]  cephALEXin (KEFLEX) 500 MG capsule Take 1 capsule (500 mg total) by mouth 2 (two) times daily. 08/05/20   Palumbo, April, MD  cholecalciferol (VITAMIN D3) 25 MCG (1000 UNIT) tablet Take 1,000 Units by mouth daily.    [provider]  esomeprazole (NEXIUM) 40 MG capsule Take 40 mg by mouth daily. 06/05/19   [provider]  fluticasone (FLONASE) 50 MCG/ACT nasal spray Place 1 spray into both nostrils daily as needed for allergies. 12/01/16   [provider]  gabapentin (NEURONTIN) 300 MG capsule Take 1 capsule (300 mg total) by mouth 3 (three) times daily. Take a 300 mg capsule three times a day for two weeks following surgery.Then take a 300 mg capsule two times a day for two weeks. Then take a 300 mg capsule once a day for two weeks. Then discontinue the Gabapentin. 07/20/19   Cassandria Anger, PA-C   lisinopril-hydrochlorothiazide (ZESTORETIC) 20-25 MG tablet Take 1 tablet by mouth daily. 06/05/19   [provider]  meclizine (ANTIVERT) 25 MG tablet Take 1 tablet (25 mg total) by mouth 3 (three) times daily as needed for dizziness. 10/17/19   Melene Plan, DO  methocarbamol (ROBAXIN) 500 MG tablet Take 1 tablet (500 mg total) by mouth every 6 (six) hours as needed for muscle spasms. 07/20/19   Cassandria Anger, PA-C  metoprolol tartrate (LOPRESSOR) 50 MG tablet Take 50 mg by mouth 2 (two) times daily. 06/05/19   [provider]  Multiple Vitamin (MULTIVITAMIN WITH MINERALS) TABS tablet Take 1 tablet by mouth daily.    [provider]  ondansetron (ZOFRAN) 4 MG tablet Take 1 tablet (4 mg total) by mouth every 6 (six) hours as needed for nausea. 07/19/19   Edmisten, Kristie L, PA  oxyCODONE (OXY IR/ROXICODONE) 5 MG immediate release tablet Take 1-2 tablets (5-10 mg total) by mouth every 6 (six) hours as needed for severe pain. 07/20/19   Cassandria Anger, PA-C  simvastatin (ZOCOR) 20 MG tablet Take 20 mg by mouth at bedtime. 02/08/19   [provider]  tobramycin (TOBREX) 0.3 % ophthalmic solution Place 1 drop into the left eye See admin instructions. Instill one drop into the left eye four times daily the day of and the day after eye injection 06/20/19   [provider]  traMADol (ULTRAM) 50 MG tablet Take 1-2 tablets (50-100 mg total) by mouth every 6 (six) hours as needed for moderate pain. 07/20/19   Cassandria Anger, PA-C  vitamin B-12 (CYANOCOBALAMIN) 1000 MCG tablet Take 1,000 mcg by mouth daily.    [provider]     Allergies    Vicodin [hydrocodone-acetaminophen]   Review of Systems   Review of Systems Please see HPI for pertinent positives and negatives  Physical Exam BP 130/83 (BP Location: Left Arm)   Pulse 87   Temp 97.8 F (36.6 C) (Oral)   Resp 20   Ht 5\' 8"  (1.727 m)   Wt 105.2 kg   SpO2 98%   BMI 35.28 kg/m    Physical Exam Vitals and nursing note reviewed.  Constitutional:      Appearance: Normal appearance.  HENT:     Head: Normocephalic and atraumatic.     Nose: Nose normal.     Mouth/Throat:     Mouth: Mucous membranes are moist.  Eyes:     Extraocular Movements: Extraocular movements intact.     Conjunctiva/sclera: Conjunctivae normal.  Cardiovascular:     Rate and Rhythm: Normal rate.  Pulmonary:     Effort: Pulmonary effort is normal.     Breath sounds: Normal breath sounds.  Abdominal:     General: Abdomen is flat.     Palpations: Abdomen is soft.     Tenderness: There is no abdominal tenderness.  Musculoskeletal:        General: No swelling. Normal range of motion.     Cervical back: Neck supple.  Skin:    General: Skin is warm and dry.  Neurological:     General: No focal deficit present.     Mental Status: She is alert.  Psychiatric:        Mood and Affect: Mood normal.     ED Results / Procedures / Treatments   EKG EKG Interpretation  Date/Time:  Wednesday December 04 2021 03-30-1992 EDT Ventricular Rate:  84 PR Interval:  181 QRS Duration: 92 QT Interval:  377 QTC Calculation: 446 R Axis:   -2 Text Interpretation: Sinus rhythm Ventricular premature complex Posterior infarct, old No significant change since last tracing Confirmed by 40:[id number]) on 12/04/2021 3:33:16 AM  Procedures Procedures  Medications Ordered in the ED Medications  ALPRAZolam 12/06/2021) tablet 0.25 mg (0.25 mg Oral Given 12/04/21 0320)    Initial Impression and Plan  Patient well appearing now. Was apparently very anxious and SOB earlier though. Discussed ED management with the patient and offered medical workup vs treating her anxiety and to ease her mind, she would prefer to have a work up done. EKG, CXR and basic labs ordered. Vitals here are normal. She is in no distress. She expects her Xanax refill to arrive tomorrow, but will give her usual dose while here for her  comfort and to avoid any potential withdrawal.   ED Course   Clinical Course as of 12/04/21 0403  Wed Dec 04, 2021  0335 CBC with mild anemia at baseline.  [CS]  0350 BMP is unremarkable.  [CS]  Dec 06, 2021 I personally viewed the images from radiology studies and agree with radiologist interpretation: CXR is clear.   [CS]  0402 Patient feeling better now. ED workup is unremarkable. Suspect this was anxiety. Recommend she discuss long term management with her PCP. RTED for any other concerns.  [CS]    Clinical Course User Index [CS] [id number], MD  MDM Rules/Calculators/A&P Medical Decision Making Problems Addressed: Panic attack: acute illness or injury  Amount and/or Complexity of Data Reviewed Labs: ordered. Decision-making details documented in ED Course. Radiology: ordered and independent interpretation performed. Decision-making details documented in ED Course. ECG/medicine tests: ordered and independent interpretation performed. Decision-making details documented in ED Course.  Risk Prescription drug management.    Final Clinical Impression(s) / ED Diagnoses Final diagnoses:  Panic attack    Rx / DC Orders ED Discharge Orders     None        Pollyann Savoy, MD 12/04/21 [phone]

## 2021-12-28 ENCOUNTER — Encounter (HOSPITAL_BASED_OUTPATIENT_CLINIC_OR_DEPARTMENT_OTHER): Payer: Self-pay | Admitting: Emergency Medicine

## 2021-12-28 ENCOUNTER — Emergency Department (HOSPITAL_BASED_OUTPATIENT_CLINIC_OR_DEPARTMENT_OTHER)
Admission: EM | Admit: 2021-12-28 | Discharge: 2021-12-28 | Disposition: A | Discharge status: Home / Self Care | Payer: Medicare Other | Attending: Emergency Medicine | Admitting: Emergency Medicine

## 2021-12-28 ENCOUNTER — Other Ambulatory Visit: Payer: Self-pay

## 2021-12-28 DIAGNOSIS — Z96649 Presence of unspecified artificial hip joint: Secondary | ICD-10-CM | POA: Diagnosis not present

## 2021-12-28 DIAGNOSIS — Z96652 Presence of left artificial knee joint: Secondary | ICD-10-CM | POA: Diagnosis not present

## 2021-12-28 DIAGNOSIS — I10 Essential (primary) hypertension: Secondary | ICD-10-CM | POA: Insufficient documentation

## 2021-12-28 DIAGNOSIS — R431 Parosmia: Secondary | ICD-10-CM | POA: Diagnosis present

## 2021-12-28 MED ORDER — SALINE SPRAY 0.65 % NA SOLN
2.0000 | NASAL | Status: DC | PRN
Start: 2021-12-28 — End: 2021-12-28
  Administered 2021-12-28: 2 via NASAL
  Filled 2021-12-28: qty 44

## 2021-12-28 NOTE — ED Triage Notes (Signed)
Pt c/o feeling "nervous on the inside" after waking up at 0200 smelling something acidic. She states that the smell took her breath away for a second and makes the back of her head feel tingly. She states she does not know if its her "acid reflux that she is smelling or something else."

## 2021-12-28 NOTE — ED Provider Notes (Signed)
[facility] Provider Note: Georgena Spurling, MD, FACEP  CSN: [id number] MRN: [id number] ARRIVAL: 12/28/21 at [place]: [id number]   CHIEF COMPLAINT  Anxiety   HISTORY OF PRESENT ILLNESS  12/28/21 4:25 AM Vanessa Blair is a 71 y.o. female has been feeling "nervous on the inside" after waking up at 2 AM smelling something acidic.  The smell took her breath away briefly and made the back of her head feel tingly she does not know if this was triggered by her acid reflux or if she smelled something else.  Now her nose feels dry and she has a mild headache.  She took Tylenol earlier.   Past Medical History:  Diagnosis Date   Anemia    Arthritis    Hypercholesteremia    Hypertension     Past Surgical History:  Procedure Laterality Date   BREAST SURGERY     NASAL SINUS SURGERY     TOTAL HIP ARTHROPLASTY     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   Procedure: TOTAL KNEE ARTHROPLASTY;  Surgeon: Gaynelle Arabian, MD;  Location: [facility];  Service: Orthopedics;  Laterality: Left;   TUBAL LIGATION      History reviewed. No pertinent family history.  Social History   Tobacco Use   Smoking status: Never   Smokeless tobacco: Never  Vaping Use   Vaping Use: Never used  Substance Use Topics   Alcohol use: Never   Drug use: Never    Prior to Admission medications   Medication Sig Start Date End Date Taking? Authorizing Provider  ALPRAZolam (XANAX) 0.25 MG tablet Take 0.25 mg by mouth at bedtime. 06/13/19   [provider]  calcium carbonate (OS-CAL - DOSED IN MG OF ELEMENTAL CALCIUM) 1250 (500 Ca) MG tablet Take 1 tablet by mouth daily with breakfast.    [provider]  cholecalciferol (VITAMIN D3) 25 MCG (1000 UNIT) tablet Take 1,000 Units by mouth daily.    [provider]  esomeprazole (NEXIUM) 40 MG capsule Take 40 mg by mouth daily. 06/05/19   [provider]  fluticasone (FLONASE) 50 MCG/ACT nasal spray Place 1 spray into both  nostrils daily as needed for allergies. 12/01/16   [provider]  gabapentin (NEURONTIN) 300 MG capsule Take 1 capsule (300 mg total) by mouth 3 (three) times daily. Take a 300 mg capsule three times a day for two weeks following surgery.Then take a 300 mg capsule two times a day for two weeks. Then take a 300 mg capsule once a day for two weeks. Then discontinue the Gabapentin. 07/20/19   Irving Copas, PA-C  lisinopril-hydrochlorothiazide (ZESTORETIC) 20-25 MG tablet Take 1 tablet by mouth daily. 06/05/19   [provider]  meclizine (ANTIVERT) 25 MG tablet Take 1 tablet (25 mg total) by mouth 3 (three) times daily as needed for dizziness. 10/17/19   Deno Etienne, DO  metoprolol tartrate (LOPRESSOR) 50 MG tablet Take 50 mg by mouth 2 (two) times daily. 06/05/19   [provider]  Multiple Vitamin (MULTIVITAMIN WITH MINERALS) TABS tablet Take 1 tablet by mouth daily.    [provider]  simvastatin (ZOCOR) 20 MG tablet Take 20 mg by mouth at bedtime. 02/08/19   [provider]  vitamin B-12 (CYANOCOBALAMIN) 1000 MCG tablet Take 1,000 mcg by mouth daily.    [provider]    Allergies Vicodin [hydrocodone-acetaminophen]   REVIEW OF SYSTEMS  Negative except as noted here or in the History of Present Illness.  PHYSICAL EXAMINATION  Initial Vital Signs Blood pressure 131/85, pulse 70, temperature 98.4 F (36.9 C), resp. rate 18, height 5\' 8"  (1.727 m), weight 102.1 kg, SpO2 100 %.  Examination General: Well-developed, well-nourished female in no acute distress; appearance consistent with age of record HENT: normocephalic; atraumatic; pharynx normal; dry nasal mucosa Eyes: pupils equal, round and reactive to light; extraocular muscles intact Neck: supple Heart: regular rate and rhythm Lungs: clear to auscultation bilaterally Abdomen: soft; nondistended; nontender; bowel sounds present Extremities: Arthritic changes Neurologic: Awake,  alert and oriented; motor function intact in all extremities and symmetric; no facial droop Skin: Warm and dry Psychiatric: Anxious   RESULTS  Summary of this visit's results, reviewed and interpreted by myself:   EKG Interpretation  Date/Time:    Ventricular Rate:    PR Interval:    QRS Duration:   QT Interval:    QTC Calculation:   R Axis:     Text Interpretation:         Laboratory Studies: No results found for this or any previous visit (from the past 24 hour(s)). Imaging Studies: No results found.  ED COURSE and MDM  Nursing notes, initial and subsequent vitals signs, including pulse oximetry, reviewed and interpreted by myself.  Vitals:   12/28/21 0415  BP: 131/85  Pulse: 70  Resp: 18  Temp: 98.4 F (36.9 C)  SpO2: 100%  Weight: 102.1 kg  Height: 5\' 8"  (1.727 m)   Medications  sodium chloride (OCEAN) 0.65 % nasal spray 2 spray (has no administration in time range)    The only abnormality seen on exam is dry nasal mucosa.  She had sinus surgery about 2 months ago and may benefit from saline nasal spray.  PROCEDURES  Procedures   ED DIAGNOSES     ICD-10-CM   1. Sensitive to smells  R43.1          Djuna Frechette, 12/30/21, MD 12/28/21 [phone]

## 2022-02-03 ENCOUNTER — Emergency Department (HOSPITAL_BASED_OUTPATIENT_CLINIC_OR_DEPARTMENT_OTHER)
Admission: EM | Admit: 2022-02-03 | Discharge: 2022-02-03 | Disposition: A | Discharge status: Home / Self Care | Payer: Medicare Other | Attending: Emergency Medicine | Admitting: Emergency Medicine

## 2022-02-03 ENCOUNTER — Encounter (HOSPITAL_BASED_OUTPATIENT_CLINIC_OR_DEPARTMENT_OTHER): Payer: Self-pay

## 2022-02-03 ENCOUNTER — Emergency Department (HOSPITAL_BASED_OUTPATIENT_CLINIC_OR_DEPARTMENT_OTHER): Payer: Medicare Other

## 2022-02-03 ENCOUNTER — Other Ambulatory Visit: Payer: Self-pay

## 2022-02-03 DIAGNOSIS — F419 Anxiety disorder, unspecified: Secondary | ICD-10-CM | POA: Insufficient documentation

## 2022-02-03 DIAGNOSIS — I10 Essential (primary) hypertension: Secondary | ICD-10-CM | POA: Insufficient documentation

## 2022-02-03 DIAGNOSIS — R251 Tremor, unspecified: Secondary | ICD-10-CM | POA: Insufficient documentation

## 2022-02-03 DIAGNOSIS — Z96649 Presence of unspecified artificial hip joint: Secondary | ICD-10-CM | POA: Diagnosis not present

## 2022-02-03 DIAGNOSIS — Z96652 Presence of left artificial knee joint: Secondary | ICD-10-CM | POA: Diagnosis not present

## 2022-02-03 DIAGNOSIS — R0602 Shortness of breath: Secondary | ICD-10-CM | POA: Diagnosis not present

## 2022-02-03 HISTORY — DX: Diaphragmatic hernia without obstruction or gangrene: K44.9

## 2022-02-03 HISTORY — DX: Gastro-esophageal reflux disease without esophagitis: K21.9

## 2022-02-03 HISTORY — DX: Anxiety disorder, unspecified: F41.9

## 2022-02-03 MED ORDER — ALUM & MAG HYDROXIDE-SIMETH 200-200-20 MG/5ML PO SUSP
15.0000 mL | Freq: Once | ORAL | Status: AC
Start: 2022-02-03 — End: 2022-02-03
  Administered 2022-02-03: 15 mL via ORAL
  Filled 2022-02-03: qty 30

## 2022-02-03 NOTE — ED Provider Notes (Signed)
[facility] Provider Note: Vanessa Spurling, MD, FACEP  CSN: [id number] MRN: [id number] ARRIVAL: 02/03/22 at [place]: [place]  02/03/22 3:02 AM Vanessa Blair is a 71 y.o. female with a history of GERD and anxiety.  She stopped taking Xanax about a month ago.  She had been taking 0.25 mg at nighttime.  She awakened this morning just prior to arrival with a sensation of anxiety, tremulousness and shortness of breath.  She denies chest pain.  She thinks this may have been triggered by smelling something acidic, which has happened before as a result of her acid reflux.  She also had some tingling of her head.  I saw the patient on 12/28/2021 for the same symptoms.  She declined any specific treatment at that time and she would not like any benzodiazepines at this time.   Past Medical History:  Diagnosis Date   Acid reflux    Anemia    Anxiety    Arthritis    Hiatal hernia    Hypercholesteremia    Hypertension     Past Surgical History:  Procedure Laterality Date   BREAST SURGERY     NASAL SINUS SURGERY     TOTAL HIP ARTHROPLASTY     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   Procedure: TOTAL KNEE ARTHROPLASTY;  Surgeon: Gaynelle Arabian, MD;  Location: [facility];  Service: Orthopedics;  Laterality: Left;   TUBAL LIGATION      History reviewed. No pertinent family history.  Social History   Tobacco Use   Smoking status: Never   Smokeless tobacco: Never  Vaping Use   Vaping Use: Never used  Substance Use Topics   Alcohol use: Never   Drug use: Never    Prior to Admission medications   Medication Sig Start Date End Date Taking? Authorizing Provider  ALPRAZolam (XANAX) 0.25 MG tablet Take 0.25 mg by mouth at bedtime. 06/13/19   [provider]  calcium carbonate (OS-CAL - DOSED IN MG OF ELEMENTAL CALCIUM) 1250 (500 Ca) MG tablet Take 1 tablet by mouth daily with breakfast.     [provider]  cholecalciferol (VITAMIN D3) 25 MCG (1000 UNIT) tablet Take 1,000 Units by mouth daily.    [provider]  esomeprazole (NEXIUM) 40 MG capsule Take 40 mg by mouth daily. 06/05/19   [provider]  fluticasone (FLONASE) 50 MCG/ACT nasal spray Place 1 spray into both nostrils daily as needed for allergies. 12/01/16   [provider]  gabapentin (NEURONTIN) 300 MG capsule Take 1 capsule (300 mg total) by mouth 3 (three) times daily. Take a 300 mg capsule three times a day for two weeks following surgery.Then take a 300 mg capsule two times a day for two weeks. Then take a 300 mg capsule once a day for two weeks. Then discontinue the Gabapentin. 07/20/19   Irving Copas, PA-C  lisinopril-hydrochlorothiazide (ZESTORETIC) 20-25 MG tablet Take 1 tablet by mouth daily. 06/05/19   [provider]  meclizine (ANTIVERT) 25 MG tablet Take 1 tablet (25 mg total) by mouth 3 (three) times daily as needed for dizziness. 10/17/19   Deno Etienne, DO  metoprolol tartrate (LOPRESSOR) 50 MG tablet Take 50 mg by mouth 2 (two) times daily. 06/05/19   [provider]  Multiple Vitamin (MULTIVITAMIN WITH MINERALS) TABS tablet Take 1 tablet by mouth daily.    [provider]  simvastatin (ZOCOR) 20  MG tablet Take 20 mg by mouth at bedtime. 02/08/19   [provider]  vitamin B-12 (CYANOCOBALAMIN) 1000 MCG tablet Take 1,000 mcg by mouth daily.    [provider]    Allergies Vicodin [hydrocodone-acetaminophen]   REVIEW OF SYSTEMS  Negative except as noted here or in the History of Present Illness.   PHYSICAL EXAMINATION  Initial Vital Signs Blood pressure (!) 137/90, pulse 86, temperature 97.9 F (36.6 C), temperature source Oral, resp. rate 20, height 5\' 8"  (1.727 m), weight 99.8 kg, SpO2 97 %.  Examination General: Well-developed, well-nourished female in no acute distress; appearance consistent with age of record HENT:  normocephalic; atraumatic Eyes: Normal appearance Neck: supple Heart: regular rate and rhythm Lungs: clear to auscultation bilaterally Abdomen: soft; nondistended; nontender; bowel sounds present Extremities: No deformity; full range of motion; pulses normal Neurologic: Awake, alert and oriented; motor function intact in all extremities and symmetric; no facial droop; tremulous Skin: Warm and dry Psychiatric: Anxious   RESULTS  Summary of this visit's results, reviewed and interpreted by myself:   EKG Interpretation  Date/Time:  Monday February 03 2022 03:41:59 EDT Ventricular Rate:  83 PR Interval:  165 QRS Duration: 94 QT Interval:  372 QTC Calculation: 438 R Axis:   -6 Text Interpretation: Sinus rhythm Consider left atrial enlargement No significant change was found Confirmed by 10-21-1996 (Paula Libra) on 02/03/2022 3:44:50 AM       Laboratory Studies: No results found for this or any previous visit (from the past 24 hour(s)). Imaging Studies: DG Chest 2 View  Result Date: 02/03/2022 CLINICAL DATA:  Shortness of breath EXAM: CHEST - 2 VIEW COMPARISON:  12/04/2021 FINDINGS: Heart and mediastinal contours are within normal limits. No focal opacities or effusions. No acute bony abnormality. IMPRESSION: No active cardiopulmonary disease. Electronically Signed   By: 12/06/2021 M.D.   On: 02/03/2022 03:33    ED COURSE and MDM  Nursing notes, initial and subsequent vitals signs, including pulse oximetry, reviewed and interpreted by myself.  Vitals:   02/03/22 0256 02/03/22 0259  BP:  (!) 137/90  Pulse:  86  Resp:  20  Temp:  97.9 F (36.6 C)  TempSrc:  Oral  SpO2:  97%  Weight: 99.8 kg   Height: 5\' 8"  (1.727 m)    Medications  alum & mag hydroxide-simeth (MAALOX/MYLANTA) 200-200-20 MG/5ML suspension 15 mL (15 mLs Oral Given 02/03/22 0315)   Patient's lung examination is normal and her chest x-ray is normal.  Her EKG is nonischemic.  I suspect her symptoms are  primarily due to anxiety.  Acid reflux during sleep may be triggering anxiety attacks.  She was advised she may take Xanax as needed for such symptoms in the future even if she does not like taking it daily.   PROCEDURES  Procedures   ED DIAGNOSES     ICD-10-CM   1. Anxiety  F41.9          Langdon Crosson, 10-14-2000, MD 02/03/22 [phone]

## 2022-02-03 NOTE — ED Triage Notes (Signed)
Pt reports that she woke up suddenly feeling short of breath and very nervous and "jittery". Pt has hx of anxiety. Pt states she smells a  "very strong acid smell"; c/o acid reflux and is unsure if this is causing her nervous feeling and SOB. Pt also states she feels tingling on either side of her head. Denies fevers, N/V/D.

## 2022-06-09 IMAGING — MR MR HEAD W/O CM
6 of 8 series · 29 of 48 positions shown · non-contrast
Comparison: None.

CLINICAL DATA: Dizziness

EXAM:
MRI HEAD WITHOUT CONTRAST
TECHNIQUE: Multiplanar, multiecho pulse sequences of the brain and surrounding
structures were obtained without intravenous contrast.

[Series 2: DWI · axial · 3.0mm · 0.94mm/px · z∈[-55,+90]mm · 9 of 100 slices shown (1 of 2)]
[im 1/100]
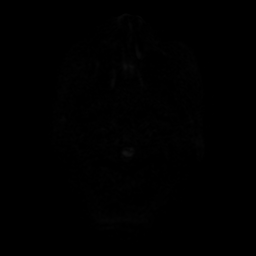
[im 19/100]
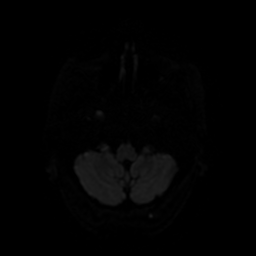
[im 28/100]
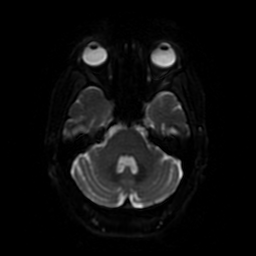
[im 46/100]
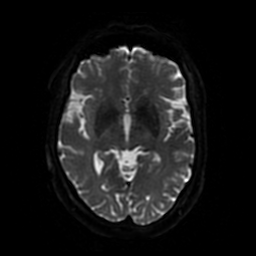
[im 55/100]
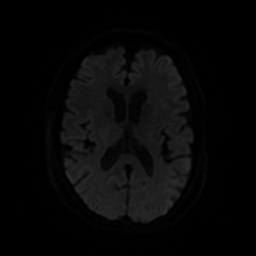
[im 73/100]
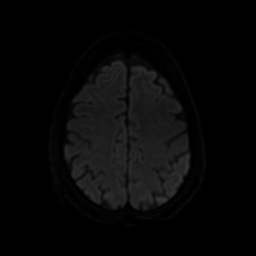
[im 82/100]
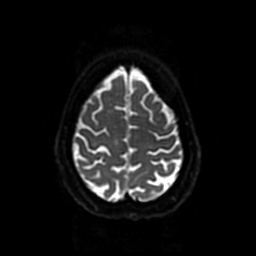
[im 91/100]
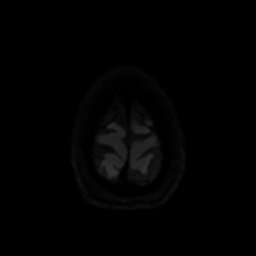
[im 100/100]
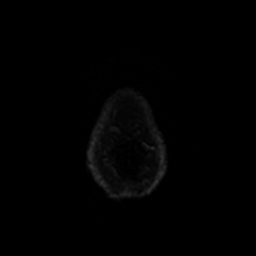

[Series 3: DWI · coronal · 4.0mm · 0.94mm/px · 8 of 74 slices shown (2 of 2)]
[im 1/74]
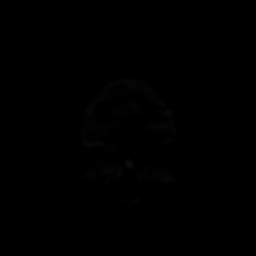
[im 11/74]
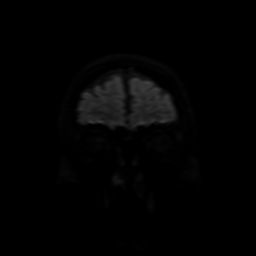
[im 21/74]
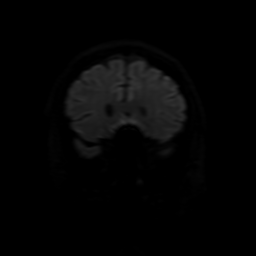
[im 32/74]
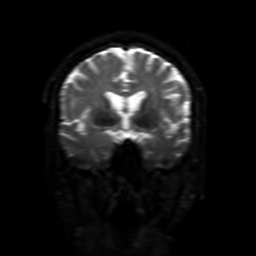
[im 42/74]
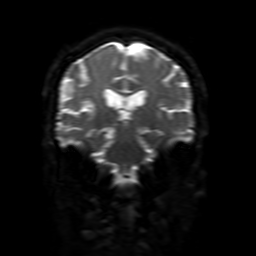
[im 53/74]
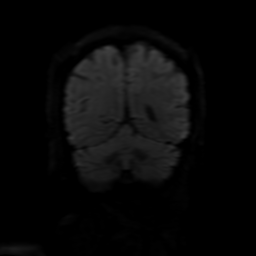
[im 63/74]
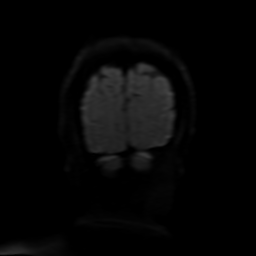
[im 74/74]
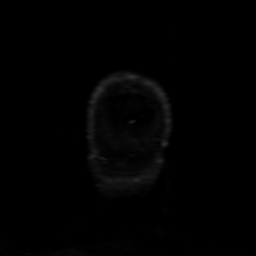

[Series 4: FLAIR · sagittal · 5.0mm · 0.23mm/px · 2 of 23 slices shown]
[im 1/23]
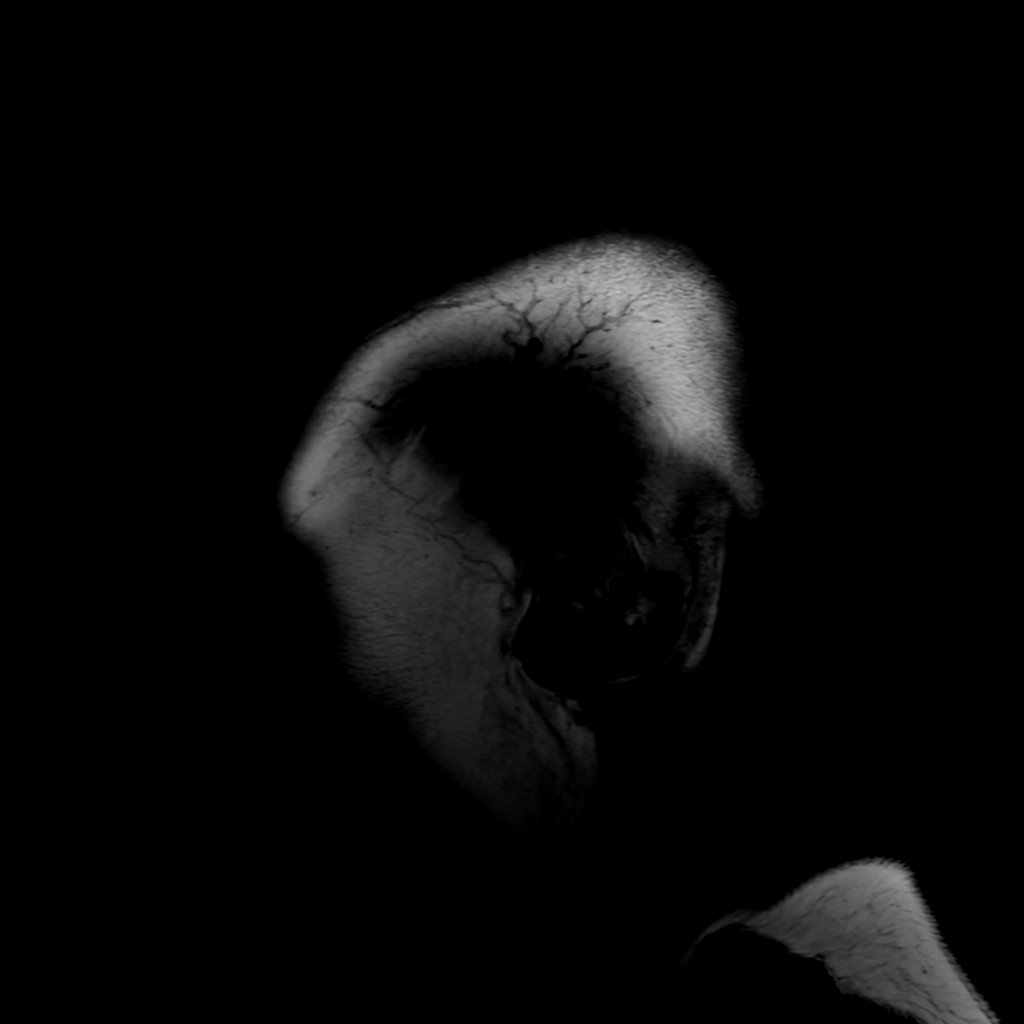
[im 23/23]
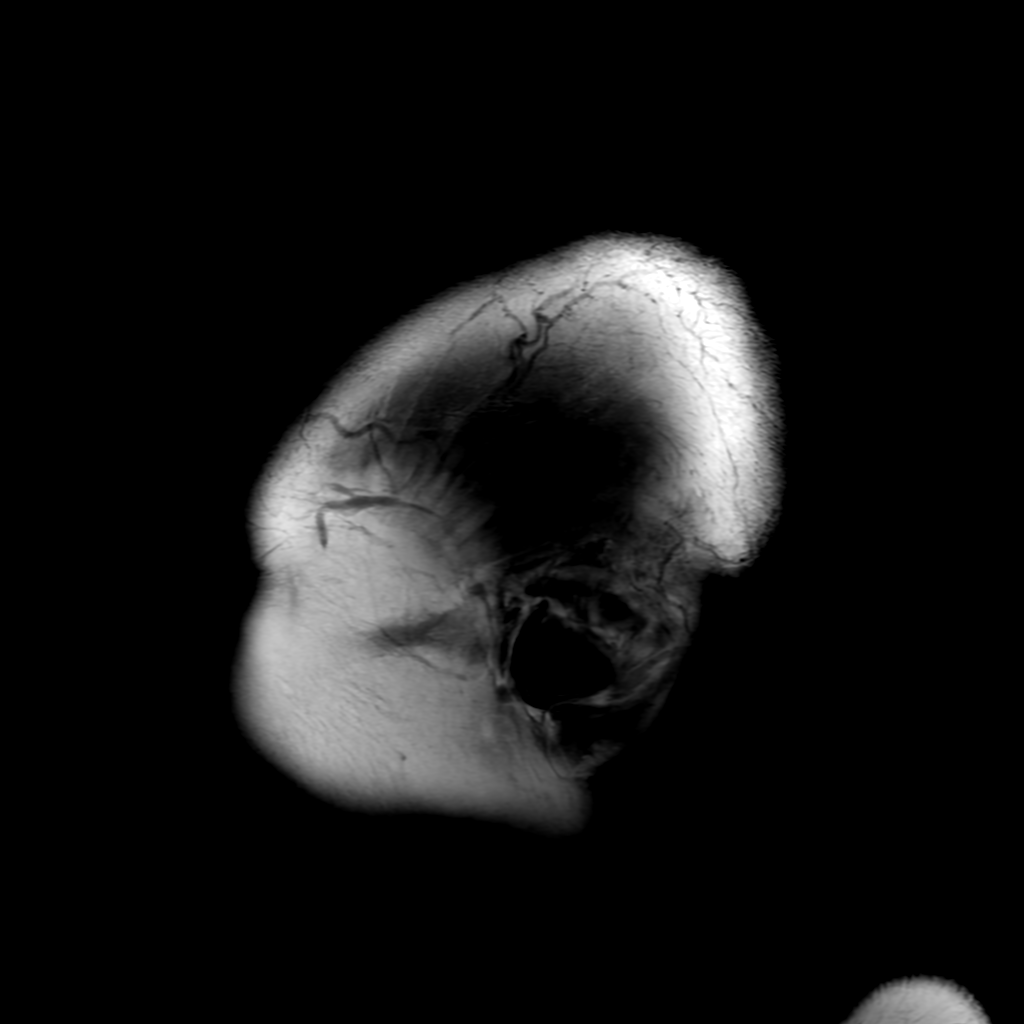

[Series 5: T2 · axial · 5.0mm · 0.23mm/px · 1 of 27 slices shown]
[im 1/27]
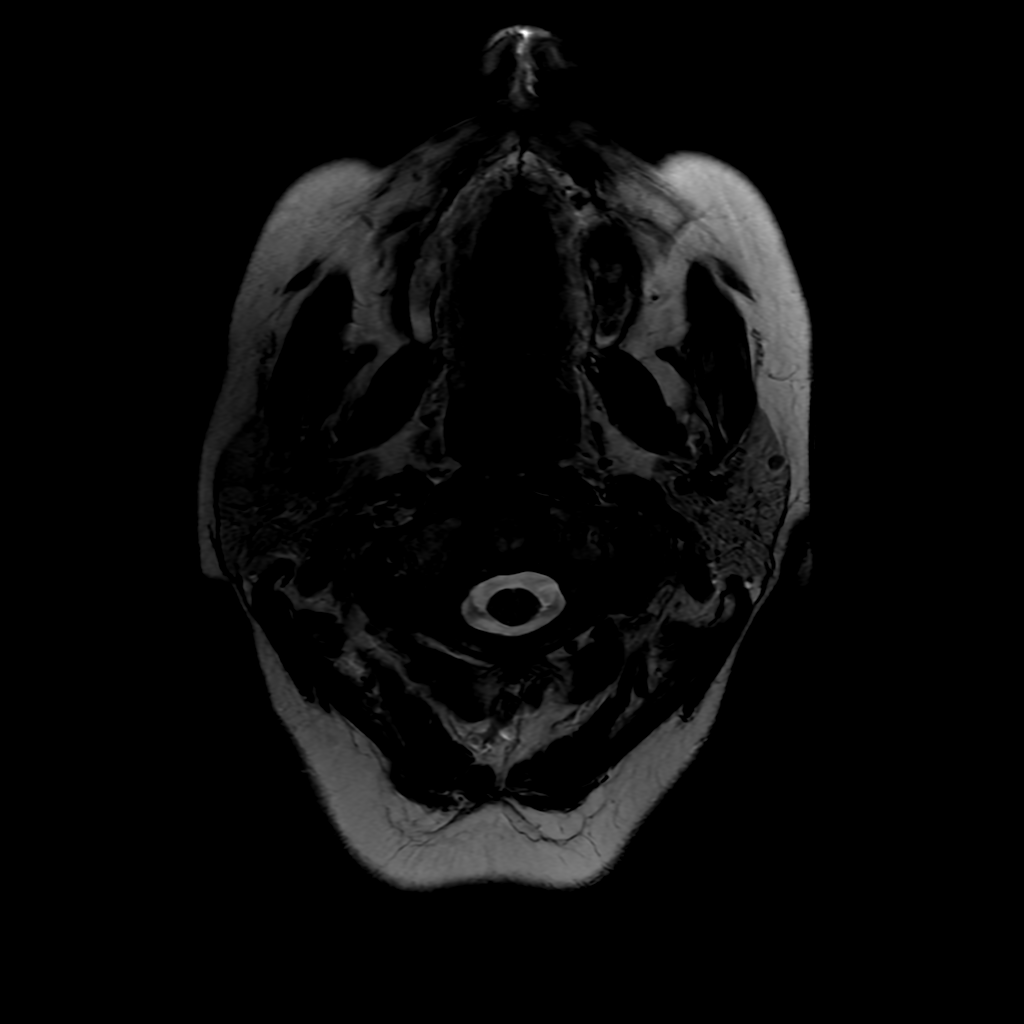

[Series 250: ADC · axial · 3.0mm · 0.94mm/px · z∈[-55,+90]mm · 5 of 50 slices shown (1 of 2)]
[im 1/50]
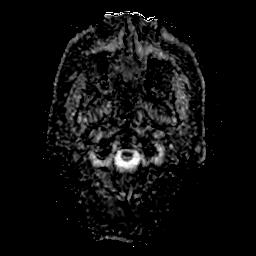
[im 13/50]
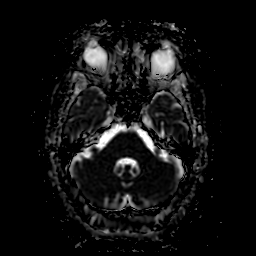
[im 25/50]
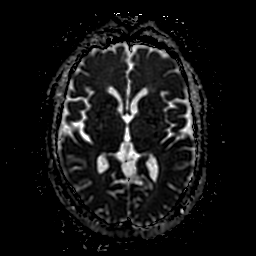
[im 37/50]
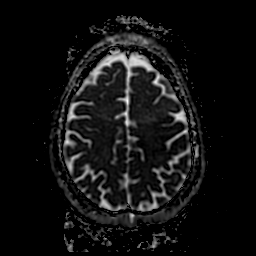
[im 50/50]
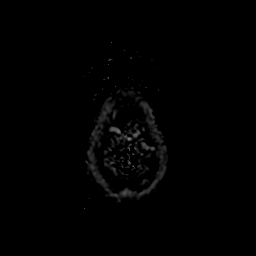

[Series 350: ADC · coronal · 4.0mm · 0.94mm/px · 4 of 37 slices shown (2 of 2)]
[im 1/37]
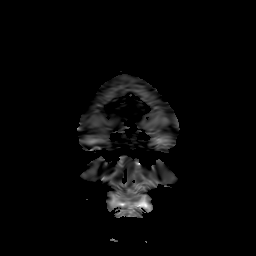
[im 13/37]
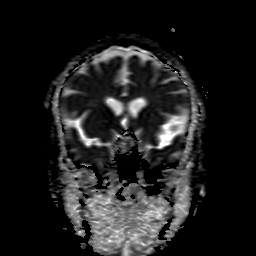
[im 25/37]
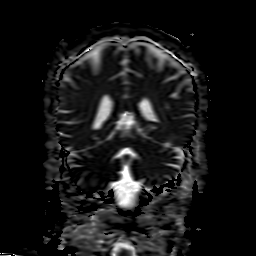
[im 37/37]
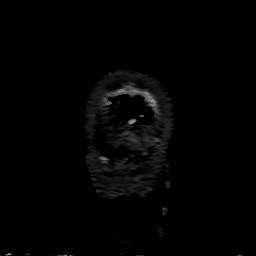

[29 of 48 positions shown; findings below may reference images not displayed]

FINDINGS: Brain: There is no acute infarction or intracranial hemorrhage.
There is no intracranial mass, mass effect, or edema. There is no
hydrocephalus or extra-axial fluid collection. Ventricles and sulci
are within normal limits in size and configuration. Patchy foci of
T2 hyperintensity in the supratentorial white matter are nonspecific
but may reflect mild chronic microvascular ischemic changes. Two
punctate foci of susceptibility in the right frontal white matter is
compatible with chronic microhemorrhage or less likely
mineralization.

Vascular: Major vessel flow voids at the skull base are preserved.

Skull and upper cervical spine: Normal marrow signal is preserved.

Sinuses/Orbits: Paranasal sinuses are aerated. Orbits are
unremarkable.

Other: Sella is unremarkable.  Mastoid air cells are clear.
IMPRESSION: No evidence of recent infarction, intracranial hemorrhage, or mass.

Mild chronic microvascular ischemic changes.

Small chronic right cerebellar infarct.

## 2022-06-09 IMAGING — CR DG CHEST 2V
2 series · 2 of 2 positions shown · non-contrast
Comparison: Chest CT February 24, 2003

CLINICAL DATA: Cough and dizziness

EXAM:
CHEST - 2 VIEW

[w chest pa]
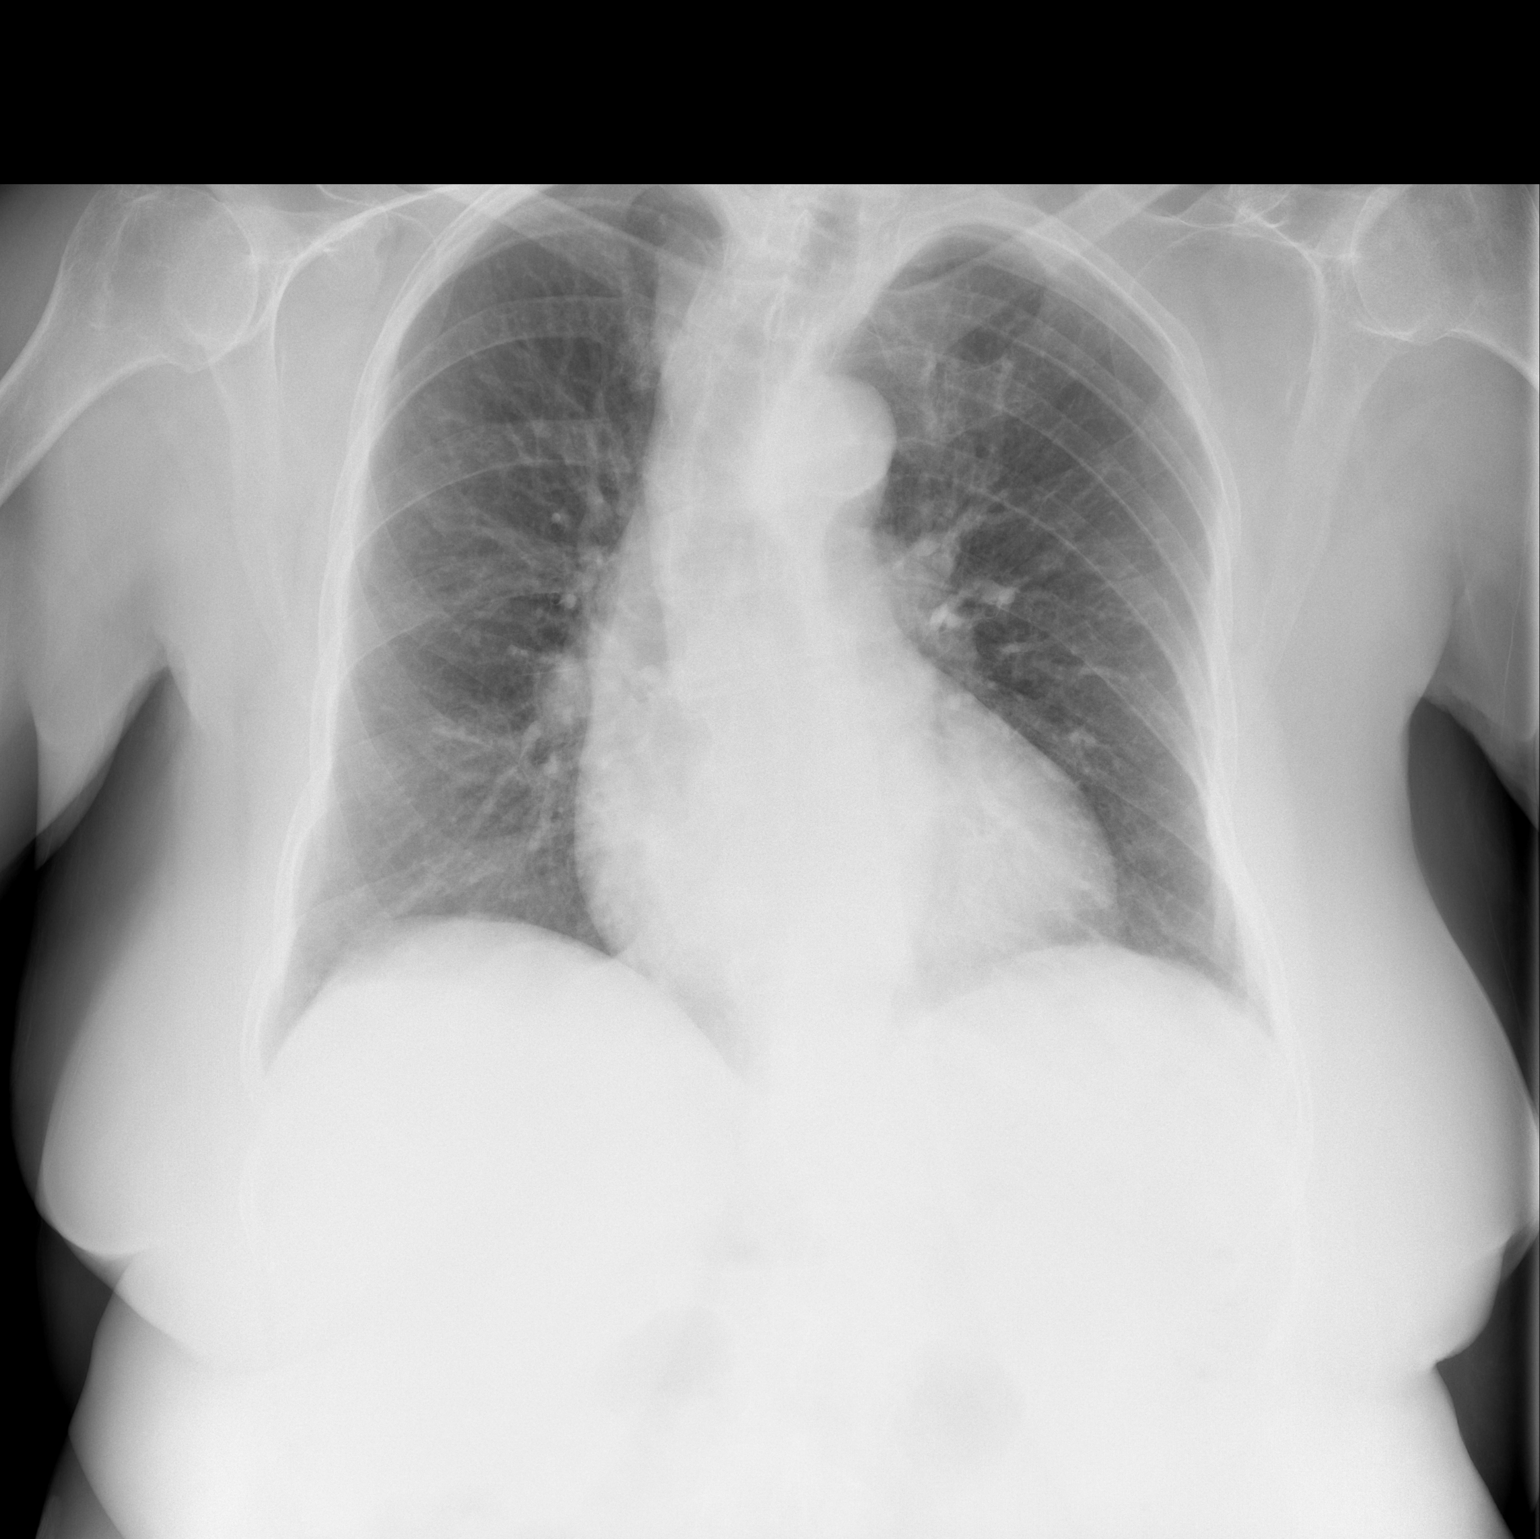

[w chest lat]
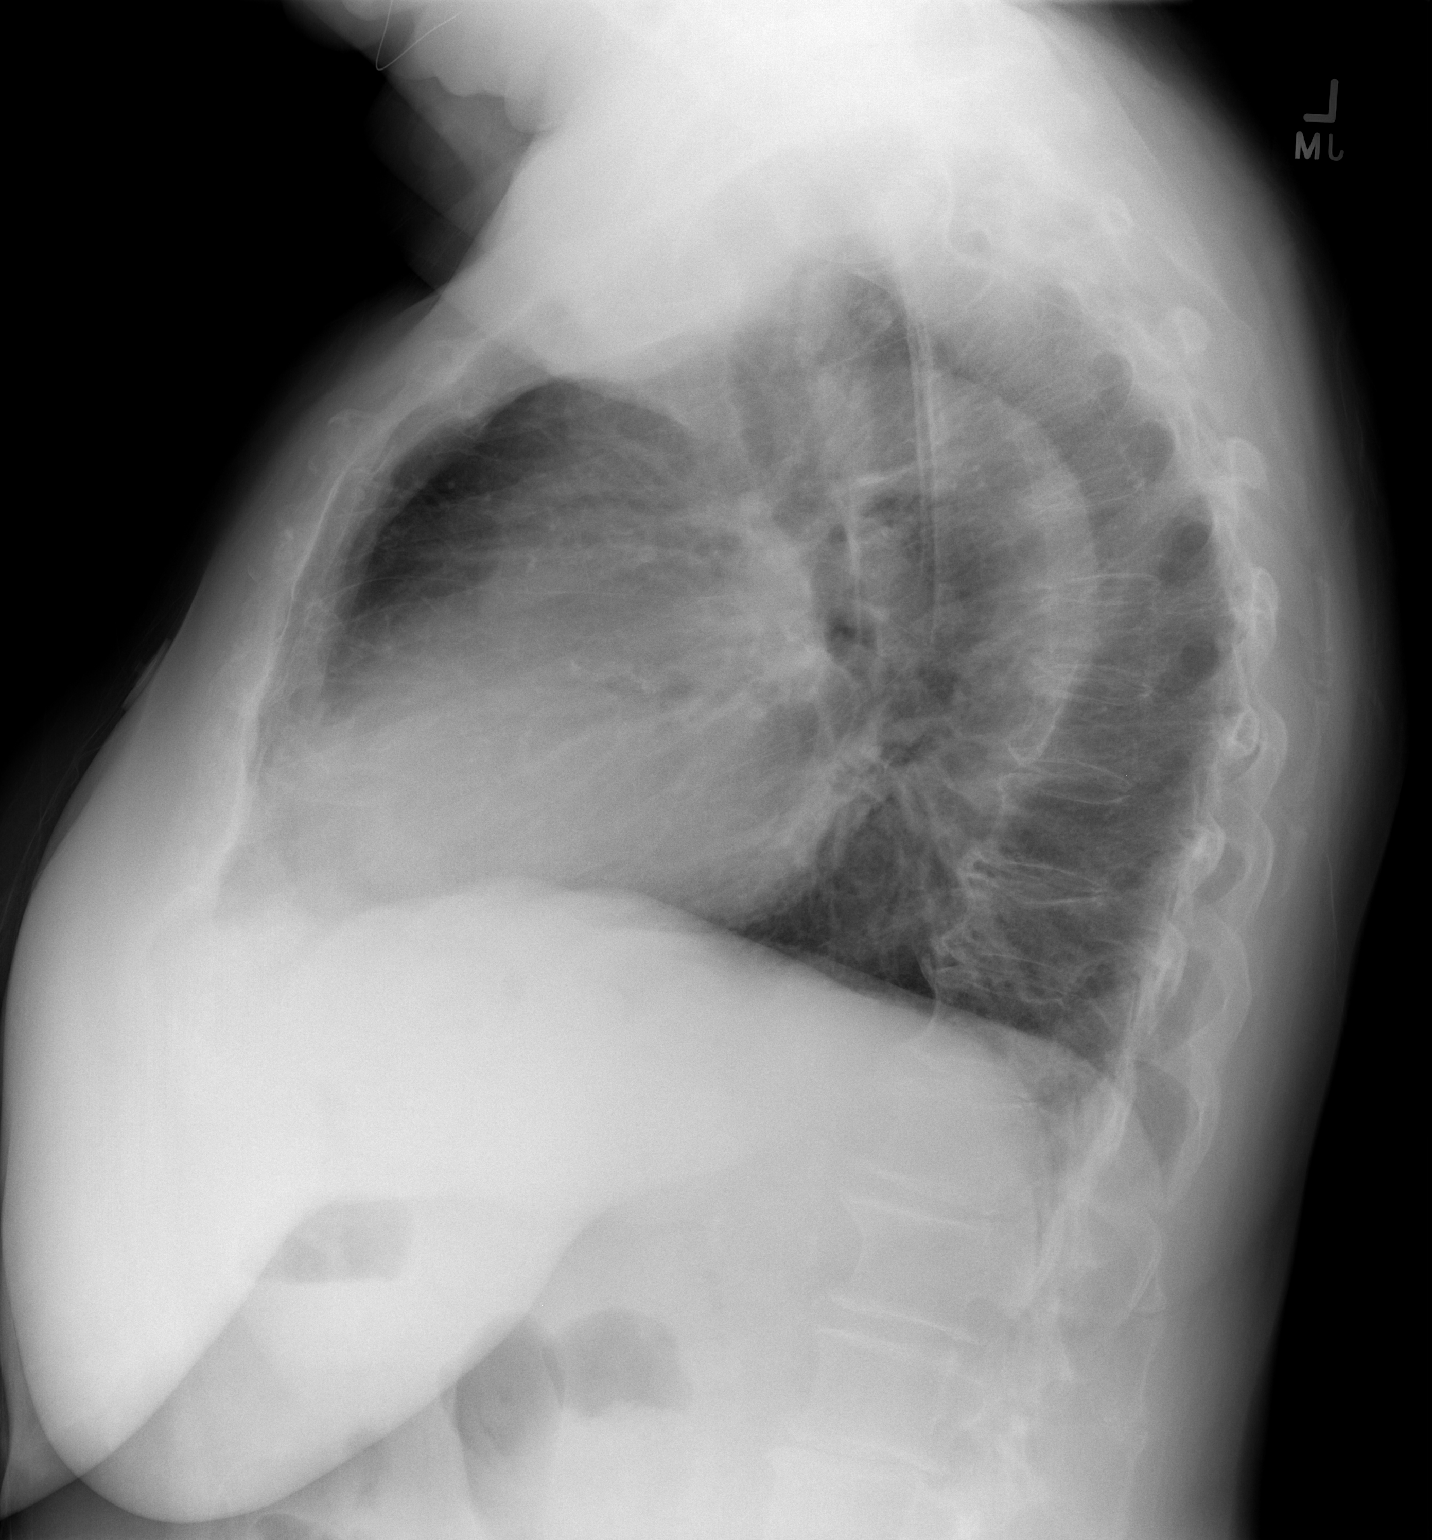

[2 of 2 positions shown; findings below may reference images not displayed]

FINDINGS: Lungs are clear. Heart size and pulmonary vascularity are normal. No
adenopathy. There is aortic atherosclerosis. There is midthoracic
dextroscoliosis with thoracolumbar levoscoliosis.
IMPRESSION: Lungs clear.  Cardiac silhouette normal.

Aortic Atherosclerosis ([id number]).

## 2022-06-09 NOTE — Therapy (Signed)
OUTPATIENT PHYSICAL THERAPY THORACOLUMBAR EVALUATION   Patient Name: Vanessa Blair MRN: [id number] DOB:25-May-1950, 72 y.o., female Today's Date: 06/09/2022  END OF SESSION:   Past Medical History:  Diagnosis Date   Acid reflux    Anemia    Anxiety    Arthritis    Hiatal hernia    Hypercholesteremia    Hypertension    Past Surgical History:  Procedure Laterality Date   BREAST SURGERY     NASAL SINUS SURGERY     TOTAL HIP ARTHROPLASTY     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   Procedure: TOTAL KNEE ARTHROPLASTY;  Surgeon: Gaynelle Arabian, MD;  Location: [facility];  Service: Orthopedics;  Laterality: Left;   TUBAL LIGATION     Patient Active Problem List   Diagnosis Date Noted   OA (osteoarthritis) of knee 07/18/2019   Primary osteoarthritis of left knee 07/18/2019    PCP: Concepcion Elk  REFERRING PROVIDER: Melina Schools  REFERRING DIAG:  M54.50 (ICD-10-CM) - Low back pain, unspecified      Rationale for Evaluation and Treatment: Rehabilitation  THERAPY DIAG:  No diagnosis found.  ONSET DATE: 05/08/22  SUBJECTIVE:                                                                                                                                                                                           SUBJECTIVE STATEMENT: I am back it has been a while but it the same problem. I had an MRI on my back and was told I had some scoliosis in my thoracic spine and osteoporosis in my low back.    PERTINENT HISTORY:  Anxiety, arthritis   PAIN:  Are you having pain? Yes: NPRS scale: 7/10 Pain location: low back and in upper traps  Pain description: dull, constant Aggravating factors: lifting heavy objects Relieving factors: meloxicam, Tylenol  PRECAUTIONS: None  WEIGHT BEARING RESTRICTIONS: No  FALLS:  Has patient fallen in last 6 months? No  LIVING ENVIRONMENT: Lives with: lives alone Lives in: House/apartment Stairs: No Has following equipment at home:  None  OCCUPATION: Retired  PLOF: Independent  PATIENT GOALS: to get rid of pain   OBJECTIVE:   DIAGNOSTIC FINDINGS:  X-rays demonstrate no change in the scoliosis    SCREENING FOR RED FLAGS: Bowel or bladder incontinence: No Spinal tumors: No Cauda equina syndrome: No Compression fracture: No Abdominal aneurysm: No  COGNITION: Overall cognitive status: Within functional limits for tasks assessed     SENSATION: WFL  MUSCLE LENGTH: Hamstrings: mild tightness BLE  POSTURE: rounded shoulders and forward head  PALPATION: Trigger points in bilateral traps and very tight.  LUMBAR ROM:   AROM eval  Flexion Can touch toes, pain coming back up  Extension 25% with pain  Right lateral flexion Fib head but pain  Left lateral flexion WFL   Right rotation 50%  Left rotation 50%    (Blank rows = not tested)  LOWER EXTREMITY ROM:   grossly WFL except R hip flexion is limited to ~105d with pain    LOWER EXTREMITY MMT:    MMT Right eval Left eval  Hip flexion 3+ pain in hip 5  Hip extension    Hip abduction 3+ pain 5  Hip adduction    Hip internal rotation    Hip external rotation    Knee flexion 5 5  Knee extension 3+ pain in hip 5  Ankle dorsiflexion    Ankle plantarflexion    Ankle inversion    Ankle eversion     (Blank rows = not tested)   FUNCTIONAL TESTS:  5 times sit to stand: 23.40s   TODAY'S TREATMENT:                                                                                                                              DATE: 06/10/22- EVAL    PATIENT EDUCATION:  Education details: HEP and POC Person educated: Patient Education method: Explanation Education comprehension: verbalized understanding  HOME EXERCISE PROGRAM: Access Code: [email or web address] URL: [email or web address] Date: 06/10/2022 Prepared by: Andris Baumann  Exercises - Supine Lower Trunk Rotation  - 1 x daily - 7 x weekly - 2 sets - 10 reps - Supine Bridge  - 1 x  daily - 7 x weekly - 2 sets - 10 reps - Supine Single Knee to Chest Stretch  - 2 x daily - 7 x weekly - 30 hold - Doorway Pec Stretch at 90 Degrees Abduction  - 2 x daily - 7 x weekly - 30 hold - Seated March with Resistance  - 1 x daily - 7 x weekly - 2 sets - 10 reps  ASSESSMENT:  CLINICAL IMPRESSION: Patient is a 72 y.o. female who was seen today for physical therapy evaluation and treatment for back pain. She has increased tightness and trigger points in her upper traps. She states her upper back and neck area bother her more than her low back but it all hurts. We talked about possible dry needling and she said she would think about it. Patient presents with weakness in her R hip and pain. She will benefit from skilled PT to work on back mobility and strengthening and as well as core and hip strengthening to decrease her pain levels and increase her activity tolerance.  OBJECTIVE IMPAIRMENTS: difficulty walking, decreased ROM, decreased strength, and pain.   REHAB POTENTIAL: Good  CLINICAL DECISION MAKING: Stable/uncomplicated  EVALUATION COMPLEXITY: Low   GOALS: Goals reviewed with patient? Yes  SHORT TERM GOALS: Target date: 07/15/22  Patient will be independent with initial HEP.  Goal status: INITIAL  2.  Patient will demonstrate improved 5xSTS < 15s. Baseline: 23.4s Goal status: INITIAL   LONG TERM GOALS: Target date: 08/19/22  Patient will be independent with advanced/ongoing HEP to improve outcomes and carryover.  Goal status: INITIAL  2.  Patient will report 75% improvement in low back pain to improve QOL.  Baseline: 7/10 Goal status: INITIAL  3.  Patient will demonstrate full pain free lumbar ROM to perform ADLs.   Goal status: INITIAL  4.  Patient will demonstrate improved functional strength as demonstrated by 5/5 in R hip. Baseline: 3+/5 Goal status: INITIAL   PLAN:  PT FREQUENCY: 2x/week  PT DURATION: 10 weeks  PLANNED INTERVENTIONS: Therapeutic  exercises, Therapeutic activity, Neuromuscular re-education, Balance training, Gait training, Patient/Family education, Self Care, Joint mobilization, Stair training, Dry Needling, Electrical stimulation, Cryotherapy, Moist heat, Traction, Ionotophoresis '4mg'$ /ml Dexamethasone, and Manual therapy.  PLAN FOR NEXT SESSION: low back mobility and stretching, hip strengthening, maybe DN to upper traps or STM if she denies   Carrington, PT 06/09/2022, 1:18 PM

## 2022-06-10 ENCOUNTER — Ambulatory Visit: Payer: 59 | Attending: Orthopedic Surgery

## 2022-06-10 DIAGNOSIS — R252 Cramp and spasm: Secondary | ICD-10-CM

## 2022-06-10 DIAGNOSIS — R293 Abnormal posture: Secondary | ICD-10-CM

## 2022-06-10 DIAGNOSIS — M5459 Other low back pain: Secondary | ICD-10-CM | POA: Diagnosis present

## 2022-06-10 DIAGNOSIS — M6281 Muscle weakness (generalized): Secondary | ICD-10-CM | POA: Diagnosis present

## 2022-06-10 DIAGNOSIS — R600 Localized edema: Secondary | ICD-10-CM | POA: Insufficient documentation

## 2022-06-10 DIAGNOSIS — M546 Pain in thoracic spine: Secondary | ICD-10-CM | POA: Diagnosis present

## 2022-06-16 ENCOUNTER — Ambulatory Visit: Payer: 59 | Admitting: Physical Therapy

## 2022-06-16 ENCOUNTER — Encounter: Payer: Self-pay | Admitting: Physical Therapy

## 2022-06-16 DIAGNOSIS — R293 Abnormal posture: Secondary | ICD-10-CM

## 2022-06-16 DIAGNOSIS — M5459 Other low back pain: Secondary | ICD-10-CM | POA: Diagnosis not present

## 2022-06-16 DIAGNOSIS — M6281 Muscle weakness (generalized): Secondary | ICD-10-CM

## 2022-06-16 DIAGNOSIS — M546 Pain in thoracic spine: Secondary | ICD-10-CM

## 2022-06-16 NOTE — Therapy (Signed)
OUTPATIENT PHYSICAL THERAPY THORACOLUMBAR TREATMENT   Patient Name: Vanessa Blair MRN: [id number] DOB:01-16-1951, 72 y.o., female Today's Date: 06/16/2022  END OF SESSION:  PT End of Session - 06/16/22 1143     Visit Number 2    Date for PT Re-Evaluation 08/19/22    PT Start Time 1143    PT Stop Time 1230    PT Time Calculation (min) 47 min    Activity Tolerance Patient tolerated treatment well    Behavior During Therapy WFL for tasks assessed/performed             Past Medical History:  Diagnosis Date   Acid reflux    Anemia    Anxiety    Arthritis    Hiatal hernia    Hypercholesteremia    Hypertension    Past Surgical History:  Procedure Laterality Date   BREAST SURGERY     NASAL SINUS SURGERY     TOTAL HIP ARTHROPLASTY     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   Procedure: TOTAL KNEE ARTHROPLASTY;  Surgeon: Gaynelle Arabian, MD;  Location: [facility];  Service: Orthopedics;  Laterality: Left;   TUBAL LIGATION     Patient Active Problem List   Diagnosis Date Noted   OA (osteoarthritis) of knee 07/18/2019   Primary osteoarthritis of left knee 07/18/2019    PCP: Concepcion Elk  REFERRING PROVIDER: Melina Schools  REFERRING DIAG:  M54.50 (ICD-10-CM) - Low back pain, unspecified      Rationale for Evaluation and Treatment: Rehabilitation  THERAPY DIAG:  Other low back pain  Pain in thoracic spine  Abnormal posture  Muscle weakness (generalized)  ONSET DATE: 05/08/22  SUBJECTIVE:                                                                                                                                                                                           SUBJECTIVE STATEMENT: I feel alright, stiffness in the shoulders   PERTINENT HISTORY:  Anxiety, arthritis   PAIN:  Are you having pain? Yes: NPRS scale: 8/10 Pain location: low back and in upper traps  Pain description: dull, constant Aggravating factors: lifting heavy objects Relieving  factors: meloxicam, Tylenol  PRECAUTIONS: None  WEIGHT BEARING RESTRICTIONS: No  FALLS:  Has patient fallen in last 6 months? No  LIVING ENVIRONMENT: Lives with: lives alone Lives in: House/apartment Stairs: No Has following equipment at home: None  OCCUPATION: Retired  PLOF: Independent  PATIENT GOALS: to get rid of pain   OBJECTIVE:   DIAGNOSTIC FINDINGS:  X-rays demonstrate no change in the scoliosis    SCREENING FOR RED FLAGS: Bowel or bladder incontinence:  No Spinal tumors: No Cauda equina syndrome: No Compression fracture: No Abdominal aneurysm: No  COGNITION: Overall cognitive status: Within functional limits for tasks assessed     SENSATION: WFL  MUSCLE LENGTH: Hamstrings: mild tightness BLE  POSTURE: rounded shoulders and forward head  PALPATION: Trigger points in bilateral traps and very tight.   LUMBAR ROM:   AROM eval  Flexion Can touch toes, pain coming back up  Extension 25% with pain  Right lateral flexion Fib head but pain  Left lateral flexion WFL   Right rotation 50%  Left rotation 50%    (Blank rows = not tested)  LOWER EXTREMITY ROM:   grossly WFL except R hip flexion is limited to ~105d with pain    LOWER EXTREMITY MMT:    MMT Right eval Left eval  Hip flexion 3+ pain in hip 5  Hip extension    Hip abduction 3+ pain 5  Hip adduction    Hip internal rotation    Hip external rotation    Knee flexion 5 5  Knee extension 3+ pain in hip 5  Ankle dorsiflexion    Ankle plantarflexion    Ankle inversion    Ankle eversion     (Blank rows = not tested)   FUNCTIONAL TESTS:  5 times sit to stand: 23.40s   TODAY'S TREATMENT:                                                                                                                              DATE:  06/16/22 NuStep L4 x6 min STM to bilat upper trap and rhomboids  Standing shoulder Ext 5lb 2x10 Hamstring curls 20lb 2x10  Leg Ext 5lb 2x10 Rows green 2x10 Horiz  shoulder abd red 2x10 Supine bridge x10, x5 Supine passive stretches HS, piriformis, lower trunk rotations,   06/10/22- EVAL     PATIENT EDUCATION:  Education details: HEP and POC Person educated: Patient Education method: Explanation Education comprehension: verbalized understanding  HOME EXERCISE PROGRAM: Access Code: [email or web address] URL: [email or web address] Date: 06/10/2022 Prepared by: Andris Baumann  Exercises - Supine Lower Trunk Rotation  - 1 x daily - 7 x weekly - 2 sets - 10 reps - Supine Bridge  - 1 x daily - 7 x weekly - 2 sets - 10 reps - Supine Single Knee to Chest Stretch  - 2 x daily - 7 x weekly - 30 hold - Doorway Pec Stretch at 90 Degrees Abduction  - 2 x daily - 7 x weekly - 30 hold - Seated March with Resistance  - 1 x daily - 7 x weekly - 2 sets - 10 reps  ASSESSMENT:  CLINICAL IMPRESSION: Patient is a 72 y.o. female who was seen today for physical therapy treatment for back pain. Pt tolerated an initial progression to We well. Cues for core engagement needed with standing rows and shoulder extensions. Bridges dis cause pt to have some fatigue. Positve response with STM and passive  stretching evident by subjective repots of easier  mobility.She will benefit from skilled PT to work on back mobility and strengthening and as well as core and hip strengthening to decrease her pain levels and increase her activity tolerance.  OBJECTIVE IMPAIRMENTS: difficulty walking, decreased ROM, decreased strength, and pain.   REHAB POTENTIAL: Good  CLINICAL DECISION MAKING: Stable/uncomplicated  EVALUATION COMPLEXITY: Low   GOALS: Goals reviewed with patient? Yes  SHORT TERM GOALS: Target date: 07/15/22  Patient will be independent with initial HEP.  Goal status: INITIAL  2.   Patient will demonstrate improved 5xSTS < 15s. Baseline: 23.4s Goal status: INITIAL   LONG TERM GOALS: Target date: 08/19/22  Patient will be independent with advanced/ongoing HEP to  improve outcomes and carryover.  Goal status: INITIAL  2.  Patient will report 75% improvement in low back pain to improve QOL.  Baseline: 7/10 Goal status: INITIAL  3.  Patient will demonstrate full pain free lumbar ROM to perform ADLs.   Goal status: INITIAL  4.  Patient will demonstrate improved functional strength as demonstrated by 5/5 in R hip. Baseline: 3+/5 Goal status: INITIAL   PLAN:  PT FREQUENCY: 2x/week  PT DURATION: 10 weeks  PLANNED INTERVENTIONS: Therapeutic exercises, Therapeutic activity, Neuromuscular re-education, Balance training, Gait training, Patient/Family education, Self Care, Joint mobilization, Stair training, Dry Needling, Electrical stimulation, Cryotherapy, Moist heat, Traction, Ionotophoresis '4mg'$ /ml Dexamethasone, and Manual therapy.  PLAN FOR NEXT SESSION: low back mobility and stretching, hip strengthening, maybe DN to upper traps or STM if she denies   Scot Jun, PTA 06/16/2022, 11:44 AM

## 2022-06-18 ENCOUNTER — Ambulatory Visit: Payer: 59 | Admitting: Physical Therapy

## 2022-06-18 ENCOUNTER — Encounter: Payer: Self-pay | Admitting: Physical Therapy

## 2022-06-18 DIAGNOSIS — M6281 Muscle weakness (generalized): Secondary | ICD-10-CM

## 2022-06-18 DIAGNOSIS — M5459 Other low back pain: Secondary | ICD-10-CM

## 2022-06-18 DIAGNOSIS — R293 Abnormal posture: Secondary | ICD-10-CM

## 2022-06-18 DIAGNOSIS — M546 Pain in thoracic spine: Secondary | ICD-10-CM

## 2022-06-18 NOTE — Therapy (Signed)
OUTPATIENT PHYSICAL THERAPY THORACOLUMBAR TREATMENT   Patient Name: Vanessa Blair MRN: :[id number] DOB:[birth year], 72 y.o., female Today's Date: 06/18/2022  END OF SESSION:  PT End of Session - 06/18/22 1100     Visit Number 3    Date for PT Re-Evaluation 08/19/22    PT Start Time 1100    PT Stop Time 1145    PT Time Calculation (min) 45 min    Activity Tolerance Patient tolerated treatment well    Behavior During Therapy WFL for tasks assessed/performed             Past Medical History:  Diagnosis Date   Acid reflux    Anemia    Anxiety    Arthritis    Hiatal hernia    Hypercholesteremia    Hypertension    Past Surgical History:  Procedure Laterality Date   BREAST SURGERY     NASAL SINUS SURGERY     TOTAL HIP ARTHROPLASTY     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   Procedure: TOTAL KNEE ARTHROPLASTY;  Surgeon: Gaynelle Arabian, MD;  Location: [facility];  Service: Orthopedics;  Laterality: Left;   TUBAL LIGATION     Patient Active Problem List   Diagnosis Date Noted   OA (osteoarthritis) of knee 07/18/2019   Primary osteoarthritis of left knee 07/18/2019    PCP: Concepcion Elk  REFERRING PROVIDER: Melina Schools  REFERRING DIAG:  M54.50 (ICD-10-CM) - Low back pain, unspecified      Rationale for Evaluation and Treatment: Rehabilitation  THERAPY DIAG:  Other low back pain  Pain in thoracic spine  Muscle weakness (generalized)  Abnormal posture  ONSET DATE: 05/08/22  SUBJECTIVE:                                                                                                                                                                                           SUBJECTIVE STATEMENT: In in the neck area yesterdy  PERTINENT HISTORY:  Anxiety, arthritis   PAIN:  Are you having pain? Yes: NPRS scale: 6/10 Pain location: low back and in upper traps  Pain description: dull, constant Aggravating factors: lifting heavy objects Relieving factors:  meloxicam, Tylenol  PRECAUTIONS: None  WEIGHT BEARING RESTRICTIONS: No  FALLS:  Has patient fallen in last 6 months? No  LIVING ENVIRONMENT: Lives with: lives alone Lives in: House/apartment Stairs: No Has following equipment at home: None  OCCUPATION: Retired  PLOF: Independent  PATIENT GOALS: to get rid of pain   OBJECTIVE:   DIAGNOSTIC FINDINGS:  X-rays demonstrate no change in the scoliosis    SCREENING FOR RED FLAGS: Bowel or bladder incontinence: No Spinal  tumors: No Cauda equina syndrome: No Compression fracture: No Abdominal aneurysm: No  COGNITION: Overall cognitive status: Within functional limits for tasks assessed     SENSATION: WFL  MUSCLE LENGTH: Hamstrings: mild tightness BLE  POSTURE: rounded shoulders and forward head  PALPATION: Trigger points in bilateral traps and very tight.   LUMBAR ROM:   AROM eval  Flexion Can touch toes, pain coming back up  Extension 25% with pain  Right lateral flexion Fib head but pain  Left lateral flexion WFL   Right rotation 50%  Left rotation 50%    (Blank rows = not tested)  LOWER EXTREMITY ROM:   grossly WFL except R hip flexion is limited to ~105d with pain    LOWER EXTREMITY MMT:    MMT Right eval Left eval  Hip flexion 3+ pain in hip 5  Hip extension    Hip abduction 3+ pain 5  Hip adduction    Hip internal rotation    Hip external rotation    Knee flexion 5 5  Knee extension 3+ pain in hip 5  Ankle dorsiflexion    Ankle plantarflexion    Ankle inversion    Ankle eversion     (Blank rows = not tested)   FUNCTIONAL TESTS:  5 times sit to stand: 23.40s   TODAY'S TREATMENT:                                                                                                                              DATE:  06/18/22 UBE L2 x 2 min each NuStep L 4 x 4 min STM to bilat upper trap and rhomboids  STM to lumbar para spinales S2S x10, x5 Shoulder Ext 10lb 2x10 Seated rows 20lb &  Lats 15lb 2x10   06/16/22 NuStep L4 x6 min STM to bilat upper trap and rhomboids  Standing shoulder Ext 5lb 2x10 Hamstring curls 20lb 2x10  Leg Ext 5lb 2x10 Rows green 2x10 Horiz shoulder abd red 2x10 Supine bridge x10, x5 Supine passive stretches HS, piriformis, lower trunk rotations,   06/10/22- EVAL     PATIENT EDUCATION:  Education details: HEP and POC Person educated: Patient Education method: Explanation Education comprehension: verbalized understanding  HOME EXERCISE PROGRAM: Access Code: [email or web address] URL: [email or web address] Date: 06/10/2022 Prepared by: Andris Baumann  Exercises - Supine Lower Trunk Rotation  - 1 x daily - 7 x weekly - 2 sets - 10 reps - Supine Bridge  - 1 x daily - 7 x weekly - 2 sets - 10 reps - Supine Single Knee to Chest Stretch  - 2 x daily - 7 x weekly - 30 hold - Doorway Pec Stretch at 90 Degrees Abduction  - 2 x daily - 7 x weekly - 30 hold - Seated March with Resistance  - 1 x daily - 7 x weekly - 2 sets - 10 reps  ASSESSMENT:  CLINICAL IMPRESSION: Patient is a 72 y.o. female who was seen today  for physical therapy treatment for back pain.  Cues for core engagement needed with standing shoulder extensions. Positive response with STM evident by improved mobility and decrease pain. Marland KitchenShe will benefit from skilled PT to work on back mobility and strengthening and as well as core and hip strengthening to decrease her pain levels and increase her activity tolerance.  OBJECTIVE IMPAIRMENTS: difficulty walking, decreased ROM, decreased strength, and pain.   REHAB POTENTIAL: Good  CLINICAL DECISION MAKING: Stable/uncomplicated  EVALUATION COMPLEXITY: Low   GOALS: Goals reviewed with patient? Yes  SHORT TERM GOALS: Target date: 07/15/22  Patient will be independent with initial HEP.  Goal status: INITIAL  2.   Patient will demonstrate improved 5xSTS < 15s. Baseline: 23.4s Goal status: INITIAL   LONG TERM GOALS: Target date:  08/19/22  Patient will be independent with advanced/ongoing HEP to improve outcomes and carryover.  Goal status: INITIAL  2.  Patient will report 75% improvement in low back pain to improve QOL.  Baseline: 7/10 Goal status: INITIAL  3.  Patient will demonstrate full pain free lumbar ROM to perform ADLs.   Goal status: INITIAL  4.  Patient will demonstrate improved functional strength as demonstrated by 5/5 in R hip. Baseline: 3+/5 Goal status: INITIAL   PLAN:  PT FREQUENCY: 2x/week  PT DURATION: 10 weeks  PLANNED INTERVENTIONS: Therapeutic exercises, Therapeutic activity, Neuromuscular re-education, Balance training, Gait training, Patient/Family education, Self Care, Joint mobilization, Stair training, Dry Needling, Electrical stimulation, Cryotherapy, Moist heat, Traction, Ionotophoresis '4mg'$ /ml Dexamethasone, and Manual therapy.  PLAN FOR NEXT SESSION: low back mobility and stretching, hip strengthening, maybe DN to upper traps or STM if she denies   Scot Jun, PTA 06/18/2022, 11:00 AM

## 2022-06-24 ENCOUNTER — Ambulatory Visit: Payer: 59 | Admitting: Physical Therapy

## 2022-06-24 ENCOUNTER — Encounter: Payer: Self-pay | Admitting: Physical Therapy

## 2022-06-24 DIAGNOSIS — M546 Pain in thoracic spine: Secondary | ICD-10-CM

## 2022-06-24 DIAGNOSIS — M6281 Muscle weakness (generalized): Secondary | ICD-10-CM

## 2022-06-24 DIAGNOSIS — R293 Abnormal posture: Secondary | ICD-10-CM

## 2022-06-24 DIAGNOSIS — M5459 Other low back pain: Secondary | ICD-10-CM | POA: Diagnosis not present

## 2022-06-24 DIAGNOSIS — R252 Cramp and spasm: Secondary | ICD-10-CM

## 2022-06-24 NOTE — Therapy (Signed)
OUTPATIENT PHYSICAL THERAPY THORACOLUMBAR TREATMENT   Patient Name: Vanessa Blair MRN: [id number] DOB:1951/01/28, 72 y.o., female Today's Date: 06/24/2022  END OF SESSION:  PT End of Session - 06/24/22 1145     Visit Number 4    Date for PT Re-Evaluation 08/19/22    PT Start Time [id number]    PT Stop Time 1230    PT Time Calculation (min) 45 min    Activity Tolerance Patient tolerated treatment well    Behavior During Therapy WFL for tasks assessed/performed             Past Medical History:  Diagnosis Date   Acid reflux    Anemia    Anxiety    Arthritis    Hiatal hernia    Hypercholesteremia    Hypertension    Past Surgical History:  Procedure Laterality Date   BREAST SURGERY     NASAL SINUS SURGERY     TOTAL HIP ARTHROPLASTY     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   Procedure: TOTAL KNEE ARTHROPLASTY;  Surgeon: Gaynelle Arabian, MD;  Location: [facility];  Service: Orthopedics;  Laterality: Left;   TUBAL LIGATION     Patient Active Problem List   Diagnosis Date Noted   OA (osteoarthritis) of knee 07/18/2019   Primary osteoarthritis of left knee 07/18/2019    PCP: Concepcion Elk  REFERRING PROVIDER: Melina Schools  REFERRING DIAG:  M54.50 (ICD-10-CM) - Low back pain, unspecified      Rationale for Evaluation and Treatment: Rehabilitation  THERAPY DIAG:  Other low back pain  Pain in thoracic spine  Muscle weakness (generalized)  Abnormal posture  Cramp and spasm  ONSET DATE: 05/08/22  SUBJECTIVE:                                                                                                                                                                                           SUBJECTIVE STATEMENT: "Im all right" Just stiffness and pain in the same spot  PERTINENT HISTORY:  Anxiety, arthritis   PAIN:  Are you having pain? Yes: NPRS scale: 5/10 Pain location: low back and in upper traps  Pain description: dull, constant Aggravating factors:  lifting heavy objects Relieving factors: meloxicam, Tylenol  PRECAUTIONS: None 5 WEIGHT BEARING RESTRICTIONS: No  FALLS:  Has patient fallen in last 6 months? No  LIVING ENVIRONMENT: Lives with: lives alone Lives in: House/apartment Stairs: No Has following equipment at home: None  OCCUPATION: Retired  PLOF: Independent  PATIENT GOALS: to get rid of pain   OBJECTIVE:   DIAGNOSTIC FINDINGS:  X-rays demonstrate no change in the scoliosis    SCREENING  FOR RED FLAGS: Bowel or bladder incontinence: No Spinal tumors: No Cauda equina syndrome: No Compression fracture: No Abdominal aneurysm: No  COGNITION: Overall cognitive status: Within functional limits for tasks assessed     SENSATION: WFL  MUSCLE LENGTH: Hamstrings: mild tightness BLE  POSTURE: rounded shoulders and forward head  PALPATION: Trigger points in bilateral traps and very tight.   LUMBAR ROM:   AROM eval  Flexion Can touch toes, pain coming back up  Extension 25% with pain  Right lateral flexion Fib head but pain  Left lateral flexion WFL   Right rotation 50%  Left rotation 50%    (Blank rows = not tested)  LOWER EXTREMITY ROM:   grossly WFL except R hip flexion is limited to ~105d with pain    LOWER EXTREMITY MMT:    MMT Right eval Left eval  Hip flexion 3+ pain in hip 5  Hip extension    Hip abduction 3+ pain 5  Hip adduction    Hip internal rotation    Hip external rotation    Knee flexion 5 5  Knee extension 3+ pain in hip 5  Ankle dorsiflexion    Ankle plantarflexion    Ankle inversion    Ankle eversion     (Blank rows = not tested)   FUNCTIONAL TESTS:  5 times sit to stand: 23.40s   TODAY'S TREATMENT:                                                                                                                              DATE:  06/24/22 NuStep L 5 x 6 min Hamstring Curls 20lb 2x10 STM to lumbar para spinales S2S x10, x5 holding red ball  STM to bilat upper  trap and rhomboids, TP noted in L scapua area  Shoulder Ext 10lb 2x10 Seated rows and lats 20lb 2x10  06/18/22 UBE L2 x 2 min each NuStep L 4 x 4 min STM to bilat upper trap and rhomboids  STM to lumbar para spinales S2S x10, x5 Shoulder Ext 10lb 2x10 Seated rows 20lb & Lats 15lb 2x10   06/16/22 NuStep L4 x6 min STM to bilat upper trap and rhomboids  Standing shoulder Ext 5lb 2x10 Hamstring curls 20lb 2x10  Leg Ext 5lb 2x10 Rows green 2x10 Horiz shoulder abd red 2x10 Supine bridge x10, x5 Supine passive stretches HS, piriformis, lower trunk rotations,   06/10/22- EVAL     PATIENT EDUCATION:  Education details: HEP and POC Person educated: Patient Education method: Explanation Education comprehension: verbalized understanding  HOME EXERCISE PROGRAM: Access Code: [email or web address] URL: [email or web address] Date: 06/10/2022 Prepared by: Andris Baumann  Exercises - Supine Lower Trunk Rotation  - 1 x daily - 7 x weekly - 2 sets - 10 reps - Supine Bridge  - 1 x daily - 7 x weekly - 2 sets - 10 reps - Supine Single Knee to Chest Stretch  - 2 x daily - 7 x weekly -  30 hold - Doorway Pec Stretch at 90 Degrees Abduction  - 2 x daily - 7 x weekly - 30 hold - Seated March with Resistance  - 1 x daily - 7 x weekly - 2 sets - 10 reps  ASSESSMENT:  CLINICAL IMPRESSION: Patient is a 72 y.o. female who was seen today for physical therapy treatment for back pain. Postural cues needed with standing shoulder extensions. Again positive response with STM. Pt reports improved mobility with sit to stands. She will benefit from skilled PT to work on back mobility and strengthening and as well as core and hip strengthening to decrease her pain levels and increase her activity tolerance.  OBJECTIVE IMPAIRMENTS: difficulty walking, decreased ROM, decreased strength, and pain.   REHAB POTENTIAL: Good  CLINICAL DECISION MAKING: Stable/uncomplicated  EVALUATION COMPLEXITY:  Low   GOALS: Goals reviewed with patient? Yes  SHORT TERM GOALS: Target date: 07/15/22  Patient will be independent with initial HEP.  Goal status: INITIAL  2.   Patient will demonstrate improved 5xSTS < 15s. Baseline: 23.4s Goal status: INITIAL   LONG TERM GOALS: Target date: 08/19/22  Patient will be independent with advanced/ongoing HEP to improve outcomes and carryover.  Goal status: INITIAL  2.  Patient will report 75% improvement in low back pain to improve QOL.  Baseline: 7/10 Goal status: INITIAL  3.  Patient will demonstrate full pain free lumbar ROM to perform ADLs.   Goal status: INITIAL  4.  Patient will demonstrate improved functional strength as demonstrated by 5/5 in R hip. Baseline: 3+/5 Goal status: INITIAL   PLAN:  PT FREQUENCY: 2x/week  PT DURATION: 10 weeks  PLANNED INTERVENTIONS: Therapeutic exercises, Therapeutic activity, Neuromuscular re-education, Balance training, Gait training, Patient/Family education, Self Care, Joint mobilization, Stair training, Dry Needling, Electrical stimulation, Cryotherapy, Moist heat, Traction, Ionotophoresis 4mg /ml Dexamethasone, and Manual therapy.  PLAN FOR NEXT SESSION: low back mobility and stretching, hip strengthening, maybe DN to upper traps or STM if she denies   Scot Jun, PTA 06/24/2022, 11:48 AM

## 2022-06-26 ENCOUNTER — Encounter: Payer: Self-pay | Admitting: Physical Therapy

## 2022-06-26 ENCOUNTER — Ambulatory Visit: Payer: 59 | Admitting: Physical Therapy

## 2022-06-26 DIAGNOSIS — M5459 Other low back pain: Secondary | ICD-10-CM | POA: Diagnosis not present

## 2022-06-26 DIAGNOSIS — R252 Cramp and spasm: Secondary | ICD-10-CM

## 2022-06-26 DIAGNOSIS — M6281 Muscle weakness (generalized): Secondary | ICD-10-CM

## 2022-06-26 DIAGNOSIS — M546 Pain in thoracic spine: Secondary | ICD-10-CM

## 2022-06-26 DIAGNOSIS — R293 Abnormal posture: Secondary | ICD-10-CM

## 2022-06-26 NOTE — Therapy (Signed)
OUTPATIENT PHYSICAL THERAPY THORACOLUMBAR TREATMENT   Patient Name: Vanessa Blair MRN: [id number] DOB:03-04-1951, 72 y.o., female Today's Date: 06/26/2022  END OF SESSION:  PT End of Session - 06/26/22 1102     Visit Number 5    Date for PT Re-Evaluation 08/19/22    PT Start Time 1100    PT Stop Time 1145    PT Time Calculation (min) 45 min    Activity Tolerance Patient tolerated treatment well    Behavior During Therapy WFL for tasks assessed/performed             Past Medical History:  Diagnosis Date   Acid reflux    Anemia    Anxiety    Arthritis    Hiatal hernia    Hypercholesteremia    Hypertension    Past Surgical History:  Procedure Laterality Date   BREAST SURGERY     NASAL SINUS SURGERY     TOTAL HIP ARTHROPLASTY     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   Procedure: TOTAL KNEE ARTHROPLASTY;  Surgeon: Gaynelle Arabian, MD;  Location: [facility];  Service: Orthopedics;  Laterality: Left;   TUBAL LIGATION     Patient Active Problem List   Diagnosis Date Noted   OA (osteoarthritis) of knee 07/18/2019   Primary osteoarthritis of left knee 07/18/2019    PCP: Concepcion Elk  REFERRING PROVIDER: Melina Schools  REFERRING DIAG:  M54.50 (ICD-10-CM) - Low back pain, unspecified      Rationale for Evaluation and Treatment: Rehabilitation  THERAPY DIAG:  Other low back pain  Pain in thoracic spine  Muscle weakness (generalized)  Abnormal posture  Cramp and spasm  ONSET DATE: 05/08/22  SUBJECTIVE:                                                                                                                                                                                           SUBJECTIVE STATEMENT: Back is tight and stiff  PERTINENT HISTORY:  Anxiety, arthritis   PAIN:  Are you having pain? Yes: NPRS scale: 6/10 Pain location: low back and in upper traps  Pain description: dull, constant Aggravating factors: lifting heavy objects Relieving  factors: meloxicam, Tylenol  PRECAUTIONS: None 5 WEIGHT BEARING RESTRICTIONS: No  FALLS:  Has patient fallen in last 6 months? No  LIVING ENVIRONMENT: Lives with: lives alone Lives in: House/apartment Stairs: No Has following equipment at home: None  OCCUPATION: Retired  PLOF: Independent  PATIENT GOALS: to get rid of pain   OBJECTIVE:   DIAGNOSTIC FINDINGS:  X-rays demonstrate no change in the scoliosis    SCREENING FOR RED FLAGS: Bowel or bladder  incontinence: No Spinal tumors: No Cauda equina syndrome: No Compression fracture: No Abdominal aneurysm: No  COGNITION: Overall cognitive status: Within functional limits for tasks assessed     SENSATION: WFL  MUSCLE LENGTH: Hamstrings: mild tightness BLE  POSTURE: rounded shoulders and forward head  PALPATION: Trigger points in bilateral traps and very tight.   LUMBAR ROM:   AROM eval  Flexion Can touch toes, pain coming back up  Extension 25% with pain  Right lateral flexion Fib head but pain  Left lateral flexion WFL   Right rotation 50%  Left rotation 50%    (Blank rows = not tested)  LOWER EXTREMITY ROM:   grossly WFL except R hip flexion is limited to ~105d with pain    LOWER EXTREMITY MMT:    MMT Right eval Left eval  Hip flexion 3+ pain in hip 5  Hip extension    Hip abduction 3+ pain 5  Hip adduction    Hip internal rotation    Hip external rotation    Knee flexion 5 5  Knee extension 3+ pain in hip 5  Ankle dorsiflexion    Ankle plantarflexion    Ankle inversion    Ankle eversion     (Blank rows = not tested)   FUNCTIONAL TESTS:  5 times sit to stand: 23.40s   TODAY'S TREATMENT:                                                                                                                              DATE:  06/26/22 Gait around back building one standing rest break. Decrease cadence with uphill ambulation. S2S x10 holding red ball x 10 OHP  Shoulder Ext 10lb 2x10 MHP to  lumbar and cervical spine. PROM to LE HS, piriformis, Single K2C, Double K2C, and lumbar rotations  06/24/22 NuStep L 5 x 6 min Hamstring Curls 20lb 2x10 STM to lumbar para spinales S2S x10, x5 holding red ball  STM to bilat upper trap and rhomboids, TP noted in L scapua area  Shoulder Ext 10lb 2x10 Seated rows and lats 20lb 2x10  06/18/22 UBE L2 x 2 min each NuStep L 4 x 4 min STM to bilat upper trap and rhomboids  STM to lumbar para spinales S2S x10, x5 Shoulder Ext 10lb 2x10 Seated rows 20lb & Lats 15lb 2x10   06/16/22 NuStep L4 x6 min STM to bilat upper trap and rhomboids  Standing shoulder Ext 5lb 2x10 Hamstring curls 20lb 2x10  Leg Ext 5lb 2x10 Rows green 2x10 Horiz shoulder abd red 2x10 Supine bridge x10, x5 Supine passive stretches HS, piriformis, lower trunk rotations,   06/10/22- EVAL     PATIENT EDUCATION:  Education details: HEP and POC Person educated: Patient Education method: Explanation Education comprehension: verbalized understanding  HOME EXERCISE PROGRAM: Access Code: [email or web address] URL: [email or web address] Date: 06/10/2022 Prepared by: Andris Baumann  Exercises - Supine Lower Trunk Rotation  - 1 x daily - 7 x weekly -  2 sets - 10 reps - Supine Bridge  - 1 x daily - 7 x weekly - 2 sets - 10 reps - Supine Single Knee to Chest Stretch  - 2 x daily - 7 x weekly - 30 hold - Doorway Pec Stretch at 90 Degrees Abduction  - 2 x daily - 7 x weekly - 30 hold - Seated March with Resistance  - 1 x daily - 7 x weekly - 2 sets - 10 reps  ASSESSMENT:  CLINICAL IMPRESSION: Patient is a 72 y.o. female who was seen today for physical therapy treatment for back pain. Postural cues needed with standing shoulder extensions. Again positive response with STM. Pt reports improved mobility with sit to stands. She will benefit from skilled PT to work on back mobility and strengthening and as well as core and hip strengthening to decrease her pain levels and  increase her activity tolerance.  OBJECTIVE IMPAIRMENTS: difficulty walking, decreased ROM, decreased strength, and pain.   REHAB POTENTIAL: Good  CLINICAL DECISION MAKING: Stable/uncomplicated  EVALUATION COMPLEXITY: Low   GOALS: Goals reviewed with patient? Yes  SHORT TERM GOALS: Target date: 07/15/22  Patient will be independent with initial HEP.  Goal status: INITIAL  2.   Patient will demonstrate improved 5xSTS < 15s. Baseline: 23.4s Goal status: INITIAL   LONG TERM GOALS: Target date: 08/19/22  Patient will be independent with advanced/ongoing HEP to improve outcomes and carryover.  Goal status: INITIAL  2.  Patient will report 75% improvement in low back pain to improve QOL.  Baseline: 7/10 Goal status: INITIAL  3.  Patient will demonstrate full pain free lumbar ROM to perform ADLs.   Goal status: INITIAL  4.  Patient will demonstrate improved functional strength as demonstrated by 5/5 in R hip. Baseline: 3+/5 Goal status: INITIAL   PLAN:  PT FREQUENCY: 2x/week  PT DURATION: 10 weeks  PLANNED INTERVENTIONS: Therapeutic exercises, Therapeutic activity, Neuromuscular re-education, Balance training, Gait training, Patient/Family education, Self Care, Joint mobilization, Stair training, Dry Needling, Electrical stimulation, Cryotherapy, Moist heat, Traction, Ionotophoresis 4mg /ml Dexamethasone, and Manual therapy.  PLAN FOR NEXT SESSION: low back mobility and stretching, hip strengthening, maybe DN to upper traps or STM if she denies   Scot Jun, PTA 06/26/2022, 11:03 AM

## 2022-07-01 ENCOUNTER — Encounter: Payer: Self-pay | Admitting: Physical Therapy

## 2022-07-01 ENCOUNTER — Ambulatory Visit: Payer: 59 | Admitting: Physical Therapy

## 2022-07-01 DIAGNOSIS — R293 Abnormal posture: Secondary | ICD-10-CM

## 2022-07-01 DIAGNOSIS — M5459 Other low back pain: Secondary | ICD-10-CM

## 2022-07-01 DIAGNOSIS — R252 Cramp and spasm: Secondary | ICD-10-CM

## 2022-07-01 DIAGNOSIS — M546 Pain in thoracic spine: Secondary | ICD-10-CM

## 2022-07-01 DIAGNOSIS — M6281 Muscle weakness (generalized): Secondary | ICD-10-CM

## 2022-07-01 NOTE — Therapy (Signed)
OUTPATIENT PHYSICAL THERAPY THORACOLUMBAR TREATMENT   Patient Name: ZENYA SCHAEDLER MRN: [id number] DOB:Sep 15, 1950, 72 y.o., female Today's Date: 07/01/2022  END OF SESSION:  PT End of Session - 07/01/22 1151     Visit Number 6    Date for PT Re-Evaluation 08/19/22    PT Start Time 1150    PT Stop Time 1230    PT Time Calculation (min) 40 min    Activity Tolerance Patient tolerated treatment well    Behavior During Therapy WFL for tasks assessed/performed             Past Medical History:  Diagnosis Date   Acid reflux    Anemia    Anxiety    Arthritis    Hiatal hernia    Hypercholesteremia    Hypertension    Past Surgical History:  Procedure Laterality Date   BREAST SURGERY     NASAL SINUS SURGERY     TOTAL HIP ARTHROPLASTY     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   Procedure: TOTAL KNEE ARTHROPLASTY;  Surgeon: Gaynelle Arabian, MD;  Location: [facility];  Service: Orthopedics;  Laterality: Left;   TUBAL LIGATION     Patient Active Problem List   Diagnosis Date Noted   OA (osteoarthritis) of knee 07/18/2019   Primary osteoarthritis of left knee 07/18/2019    PCP: Concepcion Elk  REFERRING PROVIDER: Melina Schools  REFERRING DIAG:  M54.50 (ICD-10-CM) - Low back pain, unspecified      Rationale for Evaluation and Treatment: Rehabilitation  THERAPY DIAG:  Muscle weakness (generalized)  Abnormal posture  Other low back pain  Pain in thoracic spine  Cramp and spasm  ONSET DATE: 05/08/22  SUBJECTIVE:                                                                                                                                                                                           SUBJECTIVE STATEMENT: "Stiff"   PERTINENT HISTORY:  Anxiety, arthritis   PAIN:  Are you having pain? Yes: NPRS scale: 7/10 Pain location: low back and in upper traps  Pain description: dull, constant Aggravating factors: lifting heavy objects Relieving factors:  meloxicam, Tylenol  PRECAUTIONS: None 5 WEIGHT BEARING RESTRICTIONS: No  FALLS:  Has patient fallen in last 6 months? No  LIVING ENVIRONMENT: Lives with: lives alone Lives in: House/apartment Stairs: No Has following equipment at home: None  OCCUPATION: Retired  PLOF: Independent  PATIENT GOALS: to get rid of pain   OBJECTIVE:   DIAGNOSTIC FINDINGS:  X-rays demonstrate no change in the scoliosis    SCREENING FOR RED FLAGS: Bowel or bladder incontinence: No Spinal  tumors: No Cauda equina syndrome: No Compression fracture: No Abdominal aneurysm: No  COGNITION: Overall cognitive status: Within functional limits for tasks assessed     SENSATION: WFL  MUSCLE LENGTH: Hamstrings: mild tightness BLE  POSTURE: rounded shoulders and forward head  PALPATION: Trigger points in bilateral traps and very tight.   LUMBAR ROM:   AROM eval  Flexion Can touch toes, pain coming back up  Extension 25% with pain  Right lateral flexion Fib head but pain  Left lateral flexion WFL   Right rotation 50%  Left rotation 50%    (Blank rows = not tested)  LOWER EXTREMITY ROM:   grossly WFL except R hip flexion is limited to ~105d with pain    LOWER EXTREMITY MMT:    MMT Right eval Left eval  Hip flexion 3+ pain in hip 5  Hip extension    Hip abduction 3+ pain 5  Hip adduction    Hip internal rotation    Hip external rotation    Knee flexion 5 5  Knee extension 3+ pain in hip 5  Ankle dorsiflexion    Ankle plantarflexion    Ankle inversion    Ankle eversion     (Blank rows = not tested)   FUNCTIONAL TESTS:  5 times sit to stand: 23.40s   TODAY'S TREATMENT:                                                                                                                              DATE:  07/01/22 UBE L2 x 3 min each NuStep L5 x 3 min  STM to upper traps and low back. Horiz shoulder abd red 2x10 Shoulder ER red 2x10 Shoulder Ext 5lb 2x10  06/26/22 Gait  around back building one standing rest break. Decrease cadence with uphill ambulation. S2S x10 holding red ball x 10 OHP  Shoulder Ext 10lb 2x10 MHP to lumbar and cervical spine. PROM to LE HS, piriformis, Single K2C, Double K2C, and lumbar rotations  06/24/22 NuStep L 5 x 6 min Hamstring Curls 20lb 2x10 STM to lumbar para spinales S2S x10, x5 holding red ball  STM to bilat upper trap and rhomboids, TP noted in L scapua area  Shoulder Ext 10lb 2x10 Seated rows and lats 20lb 2x10  06/18/22 UBE L2 x 2 min each NuStep L 4 x 4 min STM to bilat upper trap and rhomboids  STM to lumbar para spinales S2S x10, x5 Shoulder Ext 10lb 2x10 Seated rows 20lb & Lats 15lb 2x10   06/16/22 NuStep L4 x6 min STM to bilat upper trap and rhomboids  Standing shoulder Ext 5lb 2x10 Hamstring curls 20lb 2x10  Leg Ext 5lb 2x10 Rows green 2x10 Horiz shoulder abd red 2x10 Supine bridge x10, x5 Supine passive stretches HS, piriformis, lower trunk rotations,   06/10/22- EVAL     PATIENT EDUCATION:  Education details: HEP and POC Person educated: Patient Education method: Explanation Education comprehension: verbalized understanding  HOME EXERCISE PROGRAM: Access Code: [email or web address] URL: [email or web address] Date: 06/10/2022 Prepared by: Andris Baumann  Exercises - Supine Lower Trunk Rotation  - 1 x daily - 7 x weekly - 2 sets - 10 reps - Supine Bridge  - 1 x daily - 7 x weekly - 2 sets - 10 reps - Supine Single Knee to Chest Stretch  - 2 x daily - 7 x weekly - 30 hold - Doorway Pec Stretch at 90 Degrees Abduction  - 2 x daily - 7 x weekly - 30 hold - Seated March with Resistance  - 1 x daily - 7 x weekly - 2 sets - 10 reps  ASSESSMENT:  CLINICAL IMPRESSION: Patient is a 72 y.o. female who was seen today for physical therapy treatment for back pain. Postural cues needed with standing shoulder extensions. Pt continues to have a positive response with STM, evident by reports of improved  mobility, and decrease tightness. She will benefit from skilled PT to work on back mobility and strengthening and as well as core and hip strengthening to decrease her pain levels and increase her activity tolerance.  OBJECTIVE IMPAIRMENTS: difficulty walking, decreased ROM, decreased strength, and pain.   REHAB POTENTIAL: Good  CLINICAL DECISION MAKING: Stable/uncomplicated  EVALUATION COMPLEXITY: Low   GOALS: Goals reviewed with patient? Yes  SHORT TERM GOALS: Target date: 07/15/22  Patient will be independent with initial HEP.  Goal status: INITIAL  2.   Patient will demonstrate improved 5xSTS < 15s. Baseline: 23.4s Goal status: INITIAL   LONG TERM GOALS: Target date: 08/19/22  Patient will be independent with advanced/ongoing HEP to improve outcomes and carryover.  Goal status: INITIAL  2.  Patient will report 75% improvement in low back pain to improve QOL.  Baseline: 7/10 Goal status: INITIAL  3.  Patient will demonstrate full pain free lumbar ROM to perform ADLs.   Goal status: INITIAL  4.  Patient will demonstrate improved functional strength as demonstrated by 5/5 in R hip. Baseline: 3+/5 Goal status: INITIAL   PLAN:  PT FREQUENCY: 2x/week  PT DURATION: 10 weeks  PLANNED INTERVENTIONS: Therapeutic exercises, Therapeutic activity, Neuromuscular re-education, Balance training, Gait training, Patient/Family education, Self Care, Joint mobilization, Stair training, Dry Needling, Electrical stimulation, Cryotherapy, Moist heat, Traction, Ionotophoresis 4mg /ml Dexamethasone, and Manual therapy.  PLAN FOR NEXT SESSION: low back mobility and stretching, hip strengthening, maybe DN to upper traps or STM if she denies   Scot Jun, PTA 07/01/2022, 11:51 AM

## 2022-07-03 ENCOUNTER — Encounter: Payer: Self-pay | Admitting: Physical Therapy

## 2022-07-03 ENCOUNTER — Ambulatory Visit: Payer: 59 | Admitting: Physical Therapy

## 2022-07-03 DIAGNOSIS — R252 Cramp and spasm: Secondary | ICD-10-CM

## 2022-07-03 DIAGNOSIS — M5459 Other low back pain: Secondary | ICD-10-CM | POA: Diagnosis not present

## 2022-07-03 DIAGNOSIS — M546 Pain in thoracic spine: Secondary | ICD-10-CM

## 2022-07-03 DIAGNOSIS — R293 Abnormal posture: Secondary | ICD-10-CM

## 2022-07-03 DIAGNOSIS — M6281 Muscle weakness (generalized): Secondary | ICD-10-CM

## 2022-07-03 DIAGNOSIS — R600 Localized edema: Secondary | ICD-10-CM

## 2022-07-03 NOTE — Therapy (Signed)
OUTPATIENT PHYSICAL THERAPY THORACOLUMBAR TREATMENT   Patient Name: Vanessa Blair MRN: [id number] DOB:14-Sep-1950, 72 y.o., female Today's Date: 07/03/2022  END OF SESSION:  PT End of Session - 07/03/22 1103     Visit Number 7    Date for PT Re-Evaluation 08/19/22    PT Start Time 1102    PT Stop Time 1145    PT Time Calculation (min) 43 min    Activity Tolerance Patient tolerated treatment well    Behavior During Therapy WFL for tasks assessed/performed             Past Medical History:  Diagnosis Date   Acid reflux    Anemia    Anxiety    Arthritis    Hiatal hernia    Hypercholesteremia    Hypertension    Past Surgical History:  Procedure Laterality Date   BREAST SURGERY     NASAL SINUS SURGERY     TOTAL HIP ARTHROPLASTY     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   Procedure: TOTAL KNEE ARTHROPLASTY;  Surgeon: Gaynelle Arabian, MD;  Location: [facility];  Service: Orthopedics;  Laterality: Left;   TUBAL LIGATION     Patient Active Problem List   Diagnosis Date Noted   OA (osteoarthritis) of knee 07/18/2019   Primary osteoarthritis of left knee 07/18/2019    PCP: Concepcion Elk  REFERRING PROVIDER: Melina Schools  REFERRING DIAG:  M54.50 (ICD-10-CM) - Low back pain, unspecified      Rationale for Evaluation and Treatment: Rehabilitation  THERAPY DIAG:  Muscle weakness (generalized)  Other low back pain  Abnormal posture  Pain in thoracic spine  Cramp and spasm  Localized edema  ONSET DATE: 05/08/22  SUBJECTIVE:                                                                                                                                                                                           SUBJECTIVE STATEMENT: "Stiff up in the shoulders"   PERTINENT HISTORY:  Anxiety, arthritis   PAIN:  Are you having pain? Yes: NPRS scale: 7/10 Pain location: low back and in upper traps  Pain description: dull, constant Aggravating factors: lifting  heavy objects Relieving factors: meloxicam, Tylenol  PRECAUTIONS: None 5 WEIGHT BEARING RESTRICTIONS: No  FALLS:  Has patient fallen in last 6 months? No  LIVING ENVIRONMENT: Lives with: lives alone Lives in: House/apartment Stairs: No Has following equipment at home: None  OCCUPATION: Retired  PLOF: Independent  PATIENT GOALS: to get rid of pain   OBJECTIVE:   DIAGNOSTIC FINDINGS:  X-rays demonstrate no change in the scoliosis    SCREENING FOR RED  FLAGS: Bowel or bladder incontinence: No Spinal tumors: No Cauda equina syndrome: No Compression fracture: No Abdominal aneurysm: No  COGNITION: Overall cognitive status: Within functional limits for tasks assessed     SENSATION: WFL  MUSCLE LENGTH: Hamstrings: mild tightness BLE  POSTURE: rounded shoulders and forward head  PALPATION: Trigger points in bilateral traps and very tight.   LUMBAR ROM:   AROM eval  Flexion Can touch toes, pain coming back up  Extension 25% with pain  Right lateral flexion Fib head but pain  Left lateral flexion WFL   Right rotation 50%  Left rotation 50%    (Blank rows = not tested)  LOWER EXTREMITY ROM:   grossly WFL except R hip flexion is limited to ~105d with pain    LOWER EXTREMITY MMT:    MMT Right eval Left eval  Hip flexion 3+ pain in hip 5  Hip extension    Hip abduction 3+ pain 5  Hip adduction    Hip internal rotation    Hip external rotation    Knee flexion 5 5  Knee extension 3+ pain in hip 5  Ankle dorsiflexion    Ankle plantarflexion    Ankle inversion    Ankle eversion     (Blank rows = not tested)   FUNCTIONAL TESTS:  5 times sit to stand: 23.40s   TODAY'S TREATMENT:                                                                                                                              DATE:  07/03/22 UBE L2 x 3 min each STM to upper traps and low back. Seated rows &  Lats 20lb 2x10 Shoulder Ext 10lb 2x10 Horizontal abduction red  2x10 Shoulder Er red 2x10   07/01/22 UBE L2 x 3 min each NuStep L5 x 3 min  STM to upper traps and low back. Horiz shoulder abd red 2x10 Shoulder ER red 2x10 Shoulder Ext 5lb 2x10  06/26/22 Gait around back building one standing rest break. Decrease cadence with uphill ambulation. S2S x10 holding red ball x 10 OHP  Shoulder Ext 10lb 2x10 MHP to lumbar and cervical spine. PROM to LE HS, piriformis, Single K2C, Double K2C, and lumbar rotations  06/24/22 NuStep L 5 x 6 min Hamstring Curls 20lb 2x10 STM to lumbar para spinales S2S x10, x5 holding red ball  STM to bilat upper trap and rhomboids, TP noted in L scapua area  Shoulder Ext 10lb 2x10 Seated rows and lats 20lb 2x10  06/18/22 UBE L2 x 2 min each NuStep L 4 x 4 min STM to bilat upper trap and rhomboids  STM to lumbar para spinales S2S x10, x5 Shoulder Ext 10lb 2x10 Seated rows 20lb & Lats 15lb 2x10   06/16/22 NuStep L4 x6 min STM to bilat upper trap and rhomboids  Standing shoulder Ext 5lb 2x10 Hamstring curls 20lb 2x10  Leg Ext 5lb 2x10 Rows green 2x10 Horiz shoulder  abd red 2x10 Supine bridge x10, x5 Supine passive stretches HS, piriformis, lower trunk rotations,   06/10/22- EVAL     PATIENT EDUCATION:  Education details: HEP and POC Person educated: Patient Education method: Explanation Education comprehension: verbalized understanding  HOME EXERCISE PROGRAM: Access Code: [email or web address] URL: [email or web address] Date: 06/10/2022 Prepared by: Andris Baumann  Exercises - Supine Lower Trunk Rotation  - 1 x daily - 7 x weekly - 2 sets - 10 reps - Supine Bridge  - 1 x daily - 7 x weekly - 2 sets - 10 reps - Supine Single Knee to Chest Stretch  - 2 x daily - 7 x weekly - 30 hold - Doorway Pec Stretch at 90 Degrees Abduction  - 2 x daily - 7 x weekly - 30 hold - Seated March with Resistance  - 1 x daily - 7 x weekly - 2 sets - 10 reps  ASSESSMENT:  CLINICAL IMPRESSION: Patient is a 72 y.o. female  who was seen today for physical therapy treatment for back pain. Postural cues needed with standing shoulder extensions. Pt continues to have a positive response with STM, evident by reports of improved mobility, and decrease tightness. No issues with rows and lats. She will benefit from skilled PT to work on back mobility and strengthening and as well as core and hip strengthening to decrease her pain levels and increase her activity tolerance.  OBJECTIVE IMPAIRMENTS: difficulty walking, decreased ROM, decreased strength, and pain.   REHAB POTENTIAL: Good  CLINICAL DECISION MAKING: Stable/uncomplicated  EVALUATION COMPLEXITY: Low   GOALS: Goals reviewed with patient? Yes  SHORT TERM GOALS: Target date: 07/15/22  Patient will be independent with initial HEP.  Goal status: INITIAL  2.   Patient will demonstrate improved 5xSTS < 15s. Baseline: 23.4s Goal status: INITIAL   LONG TERM GOALS: Target date: 08/19/22  Patient will be independent with advanced/ongoing HEP to improve outcomes and carryover.  Goal status: INITIAL  2.  Patient will report 75% improvement in low back pain to improve QOL.  Baseline: 7/10 Goal status: INITIAL  3.  Patient will demonstrate full pain free lumbar ROM to perform ADLs.   Goal status: INITIAL  4.  Patient will demonstrate improved functional strength as demonstrated by 5/5 in R hip. Baseline: 3+/5 Goal status: INITIAL   PLAN:  PT FREQUENCY: 2x/week  PT DURATION: 10 weeks  PLANNED INTERVENTIONS: Therapeutic exercises, Therapeutic activity, Neuromuscular re-education, Balance training, Gait training, Patient/Family education, Self Care, Joint mobilization, Stair training, Dry Needling, Electrical stimulation, Cryotherapy, Moist heat, Traction, Ionotophoresis 4mg /ml Dexamethasone, and Manual therapy.  PLAN FOR NEXT SESSION: low back mobility and stretching, hip strengthening, maybe DN to upper traps or STM if she denies   Scot Jun,  PTA 07/03/2022, 11:03 AM

## 2022-07-15 ENCOUNTER — Ambulatory Visit: Payer: 59 | Attending: Orthopedic Surgery | Admitting: Physical Therapy

## 2022-07-15 DIAGNOSIS — R293 Abnormal posture: Secondary | ICD-10-CM | POA: Diagnosis present

## 2022-07-15 DIAGNOSIS — M5459 Other low back pain: Secondary | ICD-10-CM | POA: Diagnosis present

## 2022-07-15 DIAGNOSIS — R600 Localized edema: Secondary | ICD-10-CM | POA: Diagnosis present

## 2022-07-15 DIAGNOSIS — M546 Pain in thoracic spine: Secondary | ICD-10-CM | POA: Diagnosis present

## 2022-07-15 DIAGNOSIS — M6281 Muscle weakness (generalized): Secondary | ICD-10-CM | POA: Diagnosis present

## 2022-07-15 NOTE — Therapy (Signed)
OUTPATIENT PHYSICAL THERAPY THORACOLUMBAR TREATMENT   Patient Name: Vanessa Blair MRN: [id number] DOB:[birth year], 72 y.o., female Today's Date: 07/15/2022  END OF SESSION:  PT End of Session - 07/15/22 1312     Visit Number 8    Date for PT Re-Evaluation 08/19/22    Authorization Type [employer]    PT Start Time 1315    PT Stop Time 1400    PT Time Calculation (min) 45 min             Past Medical History:  Diagnosis Date   Acid reflux    Anemia    Anxiety    Arthritis    Hiatal hernia    Hypercholesteremia    Hypertension    Past Surgical History:  Procedure Laterality Date   BREAST SURGERY     NASAL SINUS SURGERY     TOTAL HIP ARTHROPLASTY     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   Procedure: TOTAL KNEE ARTHROPLASTY;  Surgeon: Ollen Gross, MD;  Location: [facility];  Service: Orthopedics;  Laterality: Left;   TUBAL LIGATION     Patient Active Problem List   Diagnosis Date Noted   OA (osteoarthritis) of knee 07/18/2019   Primary osteoarthritis of left knee 07/18/2019    PCP: Ardyth Gal  REFERRING PROVIDER: Venita Lick  REFERRING DIAG:  M54.50 (ICD-10-CM) - Low back pain, unspecified      Rationale for Evaluation and Treatment: Rehabilitation  THERAPY DIAG:  Muscle weakness (generalized)  Other low back pain  ONSET DATE: 05/08/22  SUBJECTIVE:                                                                                                                                                                                           SUBJECTIVE STATEMENT: Stiff and sore  PERTINENT HISTORY:  Anxiety, arthritis   PAIN:  Are you having pain? Yes: NPRS scale: 7/10 Pain location: low back and in upper traps  Pain description: dull, constant Aggravating factors: lifting heavy objects Relieving factors: meloxicam, Tylenol  PRECAUTIONS: None 5 WEIGHT BEARING RESTRICTIONS: No  FALLS:  Has patient fallen in last 6 months? No  LIVING ENVIRONMENT: Lives  with: lives alone Lives in: House/apartment Stairs: No Has following equipment at home: None  OCCUPATION: Retired  PLOF: Independent  PATIENT GOALS: to get rid of pain   OBJECTIVE:   DIAGNOSTIC FINDINGS:  X-rays demonstrate no change in the scoliosis    SCREENING FOR RED FLAGS: Bowel or bladder incontinence: No Spinal tumors: No Cauda equina syndrome: No Compression fracture: No Abdominal aneurysm: No  COGNITION: Overall cognitive status: Within functional limits for tasks assessed  SENSATION: WFL  MUSCLE LENGTH: Hamstrings: mild tightness BLE  POSTURE: rounded shoulders and forward head  PALPATION: Trigger points in bilateral traps and very tight.   LUMBAR ROM:   AROM eval  Flexion Can touch toes, pain coming back up  Extension 25% with pain  Right lateral flexion Fib head but pain  Left lateral flexion WFL   Right rotation 50%  Left rotation 50%    (Blank rows = not tested)  LOWER EXTREMITY ROM:   grossly WFL except R hip flexion is limited to ~105d with pain    LOWER EXTREMITY MMT:    MMT Right eval Left eval  Hip flexion 3+ pain in hip 5  Hip extension    Hip abduction 3+ pain 5  Hip adduction    Hip internal rotation    Hip external rotation    Knee flexion 5 5  Knee extension 3+ pain in hip 5  Ankle dorsiflexion    Ankle plantarflexion    Ankle inversion    Ankle eversion     (Blank rows = not tested)   FUNCTIONAL TESTS:  5 times sit to stand: 23.40s   TODAY'S TREATMENT:                                                                                                                              DATE:   07/15/22 Nustep L 5 Seated rows &  Lats 20lb 2x10 Shoulder Ext 10lb 2x10 Horizontal abduction red 2x10 Shoulder ER red 2x10 Ball vs wall 5 x CW and CCW Ball rolling for lumbar stretch    07/03/22 UBE L2 x 3 min each STM to upper traps and low back. Seated rows &  Lats 20lb 2x10 Shoulder Ext 10lb 2x10 Horizontal  abduction red 2x10 Shoulder Er red 2x10   07/01/22 UBE L2 x 3 min each NuStep L5 x 3 min  STM to upper traps and low back. Horiz shoulder abd red 2x10 Shoulder ER red 2x10 Shoulder Ext 5lb 2x10  06/26/22 Gait around back building one standing rest break. Decrease cadence with uphill ambulation. S2S x10 holding red ball x 10 OHP  Shoulder Ext 10lb 2x10 MHP to lumbar and cervical spine. PROM to LE HS, piriformis, Single K2C, Double K2C, and lumbar rotations  06/24/22 NuStep L 5 x 6 min Hamstring Curls 20lb 2x10 STM to lumbar para spinales S2S x10, x5 holding red ball  STM to bilat upper trap and rhomboids, TP noted in L scapua area  Shoulder Ext 10lb 2x10 Seated rows and lats 20lb 2x10  06/18/22 UBE L2 x 2 min each NuStep L 4 x 4 min STM to bilat upper trap and rhomboids  STM to lumbar para spinales S2S x10, x5 Shoulder Ext 10lb 2x10 Seated rows 20lb & Lats 15lb 2x10   06/16/22 NuStep L4 x6 min STM to bilat upper trap and rhomboids  Standing shoulder Ext 5lb 2x10 Hamstring curls 20lb 2x10  Leg Ext  5lb 2x10 Rows green 2x10 Horiz shoulder abd red 2x10 Supine bridge x10, x5 Supine passive stretches HS, piriformis, lower trunk rotations,   06/10/22- EVAL     PATIENT EDUCATION:  Education details: HEP and POC Person educated: Patient Education method: Explanation Education comprehension: verbalized understanding  HOME EXERCISE PROGRAM: Access Code: [email or web address] URL: [email or web address] Date: 06/10/2022 Prepared by: Cassie FreerMona Sajjad  Exercises - Supine Lower Trunk Rotation  - 1 x daily - 7 x weekly - 2 sets - 10 reps - Supine Bridge  - 1 x daily - 7 x weekly - 2 sets - 10 reps - Supine Single Knee to Chest Stretch  - 2 x daily - 7 x weekly - 30 hold - Doorway Pec Stretch at 90 Degrees Abduction  - 2 x daily - 7 x weekly - 30 hold - Seated March with Resistance  - 1 x daily - 7 x weekly - 2 sets - 10 reps  ASSESSMENT:  CLINICAL IMPRESSION: Pt arrives  feeling stiff, states no appts last week and she can tell. Progressed ex with cuing needed. STM gives relief and increased moblity OBJECTIVE IMPAIRMENTS: difficulty walking, decreased ROM, decreased strength, and pain.   REHAB POTENTIAL: Good  CLINICAL DECISION MAKING: Stable/uncomplicated  EVALUATION COMPLEXITY: Low   GOALS: Goals reviewed with patient? Yes  SHORT TERM GOALS: Target date: 07/15/22  Patient will be independent with initial HEP.  Goal status: INITIAL  2.   Patient will demonstrate improved 5xSTS < 15s. Baseline: 23.4s Goal status: INITIAL   LONG TERM GOALS: Target date: 08/19/22  Patient will be independent with advanced/ongoing HEP to improve outcomes and carryover.  Goal status: INITIAL  2.  Patient will report 75% improvement in low back pain to improve QOL.  Baseline: 7/10 Goal status: INITIAL  3.  Patient will demonstrate full pain free lumbar ROM to perform ADLs.   Goal status: INITIAL  4.  Patient will demonstrate improved functional strength as demonstrated by 5/5 in R hip. Baseline: 3+/5 Goal status: INITIAL   PLAN:  PT FREQUENCY: 2x/week  PT DURATION: 10 weeks  PLANNED INTERVENTIONS: Therapeutic exercises, Therapeutic activity, Neuromuscular re-education, Balance training, Gait training, Patient/Family education, Self Care, Joint mobilization, Stair training, Dry Needling, Electrical stimulation, Cryotherapy, Moist heat, Traction, Ionotophoresis 4mg /ml Dexamethasone, and Manual therapy.  PLAN FOR NEXT SESSION: low back mobility and stretching, hip strengthening, maybe DN to upper traps or STM if she denies. ASSESS GOALS   Vanessa Blair,Vanessa Blair, Vanessa Blair 07/15/2022, 1:12 PM [place] [facility] [address] [place], [place], [address] Phone: [phone]   Fax:  [phone]  Patient Details  Name: Vanessa Blair MRN: [id number] Date of Birth: 12/01/1950 Referring Provider:  Ardyth GalYbanez, Jane,  MD  Encounter Date: 07/15/2022   Suanne MarkerPAYSEUR,Vanessa Blair, Vanessa Blair 07/15/2022, 1:12 PM  [place] [facility] [address] [place], [place], [address] Phone: [phone]   Fax:  [phone]

## 2022-07-17 ENCOUNTER — Ambulatory Visit: Payer: 59 | Admitting: Physical Therapy

## 2022-07-17 ENCOUNTER — Encounter: Payer: Self-pay | Admitting: Physical Therapy

## 2022-07-17 DIAGNOSIS — M5459 Other low back pain: Secondary | ICD-10-CM

## 2022-07-17 DIAGNOSIS — M6281 Muscle weakness (generalized): Secondary | ICD-10-CM | POA: Diagnosis not present

## 2022-07-17 DIAGNOSIS — R293 Abnormal posture: Secondary | ICD-10-CM

## 2022-07-17 DIAGNOSIS — M546 Pain in thoracic spine: Secondary | ICD-10-CM

## 2022-07-17 NOTE — Therapy (Signed)
OUTPATIENT PHYSICAL THERAPY THORACOLUMBAR TREATMENT   Patient Name: Vanessa KlippelLinda S Paolo MRN: [id number] DOB:10-23-1950, 72 y.o., female Today's Date: 07/17/2022  END OF SESSION:  PT End of Session - 07/17/22 1429     Visit Number 9    Date for PT Re-Evaluation 08/19/22    PT Start Time 1430    PT Stop Time 1515    PT Time Calculation (min) 45 min    Activity Tolerance Patient tolerated treatment well    Behavior During Therapy WFL for tasks assessed/performed             Past Medical History:  Diagnosis Date   Acid reflux    Anemia    Anxiety    Arthritis    Hiatal hernia    Hypercholesteremia    Hypertension    Past Surgical History:  Procedure Laterality Date   BREAST SURGERY     NASAL SINUS SURGERY     TOTAL HIP ARTHROPLASTY     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   Procedure: TOTAL KNEE ARTHROPLASTY;  Surgeon: Ollen GrossAluisio, Frank, MD;  Location: [facility];  Service: Orthopedics;  Laterality: Left;   TUBAL LIGATION     Patient Active Problem List   Diagnosis Date Noted   OA (osteoarthritis) of knee 07/18/2019   Primary osteoarthritis of left knee 07/18/2019    PCP: Ardyth GalJane Ybanez  REFERRING PROVIDER: Venita Lickahari Brooks  REFERRING DIAG:  M54.50 (ICD-10-CM) - Low back pain, unspecified      Rationale for Evaluation and Treatment: Rehabilitation  THERAPY DIAG:  Muscle weakness (generalized)  Other low back pain  Abnormal posture  Pain in thoracic spine  ONSET DATE: 05/08/22  SUBJECTIVE:                                                                                                                                                                                           SUBJECTIVE STATEMENT: "My low back hurts and stiff"  PERTINENT HISTORY:  Anxiety, arthritis   PAIN:  Are you having pain? Yes: NPRS scale: 6/10 Pain location: low back and in upper traps  Pain description: dull, constant Aggravating factors: lifting heavy objects Relieving factors:  meloxicam, Tylenol  PRECAUTIONS: None 5 WEIGHT BEARING RESTRICTIONS: No  FALLS:  Has patient fallen in last 6 months? No  LIVING ENVIRONMENT: Lives with: lives alone Lives in: House/apartment Stairs: No Has following equipment at home: None  OCCUPATION: Retired  PLOF: Independent  PATIENT GOALS: to get rid of pain   OBJECTIVE:   DIAGNOSTIC FINDINGS:  X-rays demonstrate no change in the scoliosis    SCREENING FOR RED FLAGS: Bowel or bladder incontinence: No Spinal  tumors: No Cauda equina syndrome: No Compression fracture: No Abdominal aneurysm: No  COGNITION: Overall cognitive status: Within functional limits for tasks assessed     SENSATION: WFL  MUSCLE LENGTH: Hamstrings: mild tightness BLE  POSTURE: rounded shoulders and forward head  PALPATION: Trigger points in bilateral traps and very tight.   LUMBAR ROM:   AROM Eval/ 07/17/22   Flexion Can touch toes, pain coming back up Can touch toes, low back stiffness  Extension 25% with pain WFL some low back stiffness  Right lateral flexion Fib head but pain Fib heas with pain  Left lateral flexion [facility]  WFL  Right rotation 50% 25%  Pain  Left rotation 50%  25% limited    (Blank rows = not tested)  LOWER EXTREMITY ROM:   grossly WFL except R hip flexion is limited to ~105d with pain    LOWER EXTREMITY MMT:    MMT Right eval Left eval  Hip flexion 3+ pain in hip 5  Hip extension    Hip abduction 3+ pain 5  Hip adduction    Hip internal rotation    Hip external rotation    Knee flexion 5 5  Knee extension 3+ pain in hip 5  Ankle dorsiflexion    Ankle plantarflexion    Ankle inversion    Ankle eversion     (Blank rows = not tested)   FUNCTIONAL TESTS:  5 times sit to stand: 23.40s   TODAY'S TREATMENT:                                                                                                                              DATE:  07/17/22 NuStep L 5 x 7 min Seated rows &  Lats 20lb  2x10 Shoulder Ext 10lb 2x10 S2S Holding yellow ball 2x10 Horizontal abduction red 2x10 Hamstring curls 25lb 2x10 Leg Ext 5lb 2x10 STM to lumbar spine L side   07/15/22 Nustep L 5 Seated rows &  Lats 20lb 2x10 Shoulder Ext 10lb 2x10 Horizontal abduction red 2x10 Shoulder ER red 2x10 Ball vs wall 5 x CW and CCW Ball rolling for lumbar stretch    07/03/22 UBE L2 x 3 min each STM to upper traps and low back. Seated rows &  Lats 20lb 2x10 Shoulder Ext 10lb 2x10 Horizontal abduction red 2x10 Shoulder Er red 2x10   07/01/22 UBE L2 x 3 min each NuStep L5 x 3 min  STM to upper traps and low back. Horiz shoulder abd red 2x10 Shoulder ER red 2x10 Shoulder Ext 5lb 2x10  06/26/22 Gait around back building one standing rest break. Decrease cadence with uphill ambulation. S2S x10 holding red ball x 10 OHP  Shoulder Ext 10lb 2x10 MHP to lumbar and cervical spine. PROM to LE HS, piriformis, Single K2C, Double K2C, and lumbar rotations   PATIENT EDUCATION:  Education details: HEP and POC Person educated: Patient Education method: Explanation Education comprehension: verbalized understanding  HOME EXERCISE  PROGRAM: Access Code: [email or web address] URL: [email or web address] Date: 06/10/2022 Prepared by: Cassie Freer  Exercises - Supine Lower Trunk Rotation  - 1 x daily - 7 x weekly - 2 sets - 10 reps - Supine Bridge  - 1 x daily - 7 x weekly - 2 sets - 10 reps - Supine Single Knee to Chest Stretch  - 2 x daily - 7 x weekly - 30 hold - Doorway Pec Stretch at 90 Degrees Abduction  - 2 x daily - 7 x weekly - 30 hold - Seated March with Resistance  - 1 x daily - 7 x weekly - 2 sets - 10 reps  ASSESSMENT:  CLINICAL IMPRESSION: Again pt arrives feeling stiff in the low back. Slight improvement made increasing her rotational lumbar ROM. Postural cues needed standing shoulder Ext. Tactile cue needed to prevent posterior leaning with seated rows. STM gives relief and increased  moblity OBJECTIVE IMPAIRMENTS: difficulty walking, decreased ROM, decreased strength, and pain.   REHAB POTENTIAL: Good  CLINICAL DECISION MAKING: Stable/uncomplicated  EVALUATION COMPLEXITY: Low   GOALS: Goals reviewed with patient? Yes  SHORT TERM GOALS: Target date: 07/15/22  Patient will be independent with initial HEP.  Goal status: Met  2.   Patient will demonstrate improved 5xSTS < 15s. Baseline: 23.4s Goal status: INITIAL   LONG TERM GOALS: Target date: 08/19/22  Patient will be independent with advanced/ongoing HEP to improve outcomes and carryover.  Goal status: INITIAL  2.  Patient will report 75% improvement in low back pain to improve QOL.  Baseline: 7/10 Goal status: INITIAL  3.  Patient will demonstrate full pain free lumbar ROM to perform ADLs.   Goal status: On going  4.  Patient will demonstrate improved functional strength as demonstrated by 5/5 in R hip. Baseline: 3+/5 Goal status: INITIAL   PLAN:  PT FREQUENCY: 2x/week  PT DURATION: 10 weeks  PLANNED INTERVENTIONS: Therapeutic exercises, Therapeutic activity, Neuromuscular re-education, Balance training, Gait training, Patient/Family education, Self Care, Joint mobilization, Stair training, Dry Needling, Electrical stimulation, Cryotherapy, Moist heat, Traction, Ionotophoresis 4mg /ml Dexamethasone, and Manual therapy.  PLAN FOR NEXT SESSION: low back mobility and stretching, hip strengthening, maybe DN to upper traps or STM if she denies. ASSESS GOALS    Grayce Sessions, PTA 07/17/2022, 2:29 PM

## 2022-07-21 ENCOUNTER — Encounter: Payer: Self-pay | Admitting: Physical Therapy

## 2022-07-21 ENCOUNTER — Ambulatory Visit: Payer: 59 | Admitting: Physical Therapy

## 2022-07-21 DIAGNOSIS — M546 Pain in thoracic spine: Secondary | ICD-10-CM

## 2022-07-21 DIAGNOSIS — M5459 Other low back pain: Secondary | ICD-10-CM

## 2022-07-21 DIAGNOSIS — M6281 Muscle weakness (generalized): Secondary | ICD-10-CM

## 2022-07-21 DIAGNOSIS — R293 Abnormal posture: Secondary | ICD-10-CM

## 2022-07-21 NOTE — Therapy (Unsigned)
OUTPATIENT PHYSICAL THERAPY THORACOLUMBAR TREATMENT Progress Note Reporting Period 06/10/22 to 07/22/22  See note below for Objective Data and Assessment of Progress/Goals.      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   Procedure: TOTAL KNEE ARTHROPLASTY;  Surgeon: Ollen Gross, MD;  Location: [facility];  Service: Orthopedics;  Laterality: Left;   TUBAL LIGATION     Patient Active Problem List   Diagnosis Date Noted   OA (osteoarthritis) of knee 07/18/2019   Primary osteoarthritis of left knee 07/18/2019    PCP: Ardyth Gal  REFERRING PROVIDER: Venita Lick  REFERRING DIAG:  M54.50 (ICD-10-CM) - Low back pain, unspecified      Rationale for Evaluation and Treatment: Rehabilitation  THERAPY DIAG:  Muscle weakness (generalized)  Other low back pain  Abnormal posture  Pain in thoracic spine  ONSET DATE: 05/08/22  SUBJECTIVE:                                                                                                                                                                                           SUBJECTIVE STATEMENT: "Im ok"I walked some yesterday  PERTINENT HISTORY:  Anxiety, arthritis   PAIN:  Are you having pain? Yes: NPRS scale: 6/10 Pain location: low back and in upper traps  Pain description: dull, constant Aggravating factors: lifting heavy objects Relieving factors: meloxicam, Tylenol  PRECAUTIONS: None 5 WEIGHT BEARING RESTRICTIONS:  No  FALLS:  Has patient fallen in last 6 months? No  LIVING ENVIRONMENT: Lives with: lives alone Lives in: House/apartment Stairs: No Has following equipment at home: None  OCCUPATION: Retired  PLOF: Independent  PATIENT GOALS: to get rid of pain   OBJECTIVE:   DIAGNOSTIC FINDINGS:  X-rays demonstrate no change in the scoliosis    SCREENING FOR RED FLAGS: Bowel or bladder incontinence: No Spinal tumors: No Cauda equina syndrome: No Compression fracture: No Abdominal aneurysm: No  COGNITION: Overall cognitive status: Within functional limits for tasks  assessed     SENSATION: WFL  MUSCLE LENGTH: Hamstrings: mild tightness BLE  POSTURE: rounded shoulders and forward head  PALPATION: Trigger points in bilateral traps and very tight.   LUMBAR ROM:   AROM Eval/ 07/17/22   Flexion Can touch toes, pain coming back up Can touch toes, low back stiffness  Extension 25% with pain WFL some low back stiffness  Right lateral flexion Fib head but pain Fib heas with pain  Left lateral flexion [facility]  WFL  Right rotation 50% 25%  Pain  Left rotation 50%  25% limited    (Blank rows = not tested)  LOWER EXTREMITY ROM:   grossly WFL except R hip flexion is limited to ~105d with pain    LOWER EXTREMITY MMT:    MMT Right eval Left eval Right  07/21/22  Hip flexion 3+ pain in hip 5   Hip extension     Hip abduction 3+ pain 5 4+  Hip adduction     Hip internal rotation     Hip external rotation     Knee flexion 5 5   Knee extension 3+ pain in hip 5 4  Ankle dorsiflexion     Ankle plantarflexion     Ankle inversion     Ankle eversion      (Blank rows = not tested)   FUNCTIONAL TESTS:  5 times sit to stand: 23.40s   TODAY'S TREATMENT:                                                                                                                              DATE:  07/21/22 NuStep L 5 x 6 min Hamstring curls 25lb 2x10 Leg Ext 10lb 2x10 Seated rows 25lb x10,  20lb x10 Shoulder Ext 10lb 2x10 Horiz abd red 2x10 STM to lumbar spine L side and both UT and rhomboids   07/17/22 NuStep L 5 x 7 min Seated rows &  Lats 20lb 2x10 Shoulder Ext 10lb 2x10 S2S Holding yellow ball 2x10 Horizontal abduction red 2x10 Hamstring curls 25lb 2x10 Leg Ext 5lb 2x10 STM to lumbar spine L side   07/15/22 Nustep L 5 Seated rows &  Lats 20lb 2x10 Shoulder Ext 10lb 2x10 Horizontal abduction red 2x10 Shoulder ER red 2x10 Ball vs wall 5 x CW and CCW Ball rolling for lumbar stretch    07/03/22 UBE L2 x 3 min each STM to upper traps and low back. Seated rows &  Lats 20lb 2x10 Shoulder Ext 10lb 2x10 Horizontal abduction red 2x10 Shoulder Er red 2x10   07/01/22 UBE L2 x 3 min each NuStep L5 x 3 min  STM to upper traps and low back. Horiz shoulder abd red 2x10 Shoulder ER red 2x10 Shoulder Ext 5lb 2x10  06/26/22 Gait around back building one standing rest break. Decrease cadence with uphill ambulation. S2S x10 holding red ball x 10 OHP  Shoulder Ext 10lb 2x10 MHP to lumbar and cervical spine.  PROM to LE HS, piriformis, Single K2C, Double K2C, and lumbar rotations   PATIENT EDUCATION:  Education details: HEP and POC Person educated: Patient Education method: Explanation Education comprehension: verbalized understanding  HOME EXERCISE PROGRAM: Access Code: [email or web address] URL: [email or web address] Date: 06/10/2022 Prepared by: Cassie Freer  Exercises - Supine Lower Trunk Rotation  - 1 x daily - 7 x weekly - 2 sets - 10 reps - Supine Bridge  - 1 x daily - 7 x weekly - 2 sets - 10 reps - Supine Single Knee to Chest Stretch  - 2 x daily - 7 x weekly - 30 hold - Doorway Pec Stretch at 90 Degrees Abduction  - 2 x daily - 7 x weekly - 30 hold - Seated March with Resistance  - 1 x daily - 7 x weekly - 2 sets - 10 reps  ASSESSMENT:  CLINICAL IMPRESSION: Pt has progressed towards most goals. No improvement with her base line pain which  she rated 7/10. Postural cues needed with seated rows and standing shoulder Ext. Some initial Lumbar tenderness with STM that went away as MT progressed. Some L knee discomfort reported with sit tos stands.    OBJECTIVE IMPAIRMENTS: difficulty walking, decreased ROM, decreased strength, and pain.   REHAB POTENTIAL: Good  CLINICAL DECISION MAKING: Stable/uncomplicated  EVALUATION COMPLEXITY: Low   GOALS: Goals reviewed with patient? Yes  SHORT TERM GOALS: Target date: 07/15/22  Patient will be independent with initial HEP.  Goal status: Met  2.   Patient will demonstrate improved 5xSTS < 15s. Baseline: 23.4s Goal status: Met 15 sec   LONG TERM GOALS: Target date: 08/19/22  Patient will be independent with advanced/ongoing HEP to improve outcomes and carryover.  Goal status: INITIAL  2.  Patient will report 75% improvement in low back pain to improve QOL.  Baseline: 7/10 Goal status: 7/10 ongoing   3.  Patient will demonstrate full pain free lumbar ROM to perform ADLs.   Goal status: Progressing ROM taken last session  4.  Patient will demonstrate improved functional strength as demonstrated by 5/5 in R hip. Baseline: 3+/5 Goal status: 07/21/22 Progressing see above    PLAN:  PT FREQUENCY: 2x/week  PT DURATION: 10 weeks  PLANNED INTERVENTIONS: Therapeutic exercises, Therapeutic activity, Neuromuscular re-education, Balance training, Gait training, Patient/Family education, Self Care, Joint mobilization, Stair training, Dry Needling, Electrical stimulation, Cryotherapy, Moist heat, Traction, Ionotophoresis 4mg /ml Dexamethasone, and Manual therapy.  PLAN FOR NEXT SESSION: low back mobility and stretching, hip strengthening, maybe DN to upper traps or STM if she denies. ASSESS GOALS    Grayce Sessions, PTA 07/21/2022, 3:16 PM

## 2022-07-22 ENCOUNTER — Ambulatory Visit: Payer: 59 | Admitting: Physical Therapy

## 2022-07-24 ENCOUNTER — Encounter: Payer: Self-pay | Admitting: Physical Therapy

## 2022-07-24 ENCOUNTER — Ambulatory Visit: Payer: 59 | Admitting: Physical Therapy

## 2022-07-24 DIAGNOSIS — M6281 Muscle weakness (generalized): Secondary | ICD-10-CM | POA: Diagnosis not present

## 2022-07-24 DIAGNOSIS — R293 Abnormal posture: Secondary | ICD-10-CM

## 2022-07-24 DIAGNOSIS — M546 Pain in thoracic spine: Secondary | ICD-10-CM

## 2022-07-24 DIAGNOSIS — M5459 Other low back pain: Secondary | ICD-10-CM

## 2022-07-24 NOTE — Therapy (Signed)
OUTPATIENT PHYSICAL THERAPY THORACOLUMBAR TREATMENT  Patient Name: Vanessa Blair MRN: [id number] DOB:1950/07/27, 72 y.o., female Today's Date: 07/24/2022  END OF SESSION:  PT End of Session - 07/24/22 1147     Visit Number 11    Date for PT Re-Evaluation 08/19/22    PT Start Time 1147    PT Stop Time 1230    PT Time Calculation (min) 43 min    Activity Tolerance Patient tolerated treatment well    Behavior During Therapy WFL for tasks assessed/performed             Past Medical History:  Diagnosis Date   Acid reflux    Anemia    Anxiety    Arthritis    Hiatal hernia    Hypercholesteremia    Hypertension    Past Surgical History:  Procedure Laterality Date   BREAST SURGERY     NASAL SINUS SURGERY     TOTAL HIP ARTHROPLASTY     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   Procedure: TOTAL KNEE ARTHROPLASTY;  Surgeon: Ollen Gross, MD;  Location: [facility];  Service: Orthopedics;  Laterality: Left;   TUBAL LIGATION     Patient Active Problem List   Diagnosis Date Noted   OA (osteoarthritis) of knee 07/18/2019   Primary osteoarthritis of left knee 07/18/2019    PCP: Ardyth Gal  REFERRING PROVIDER: Venita Lick  REFERRING DIAG:  M54.50 (ICD-10-CM) - Low back pain, unspecified      Rationale for Evaluation and Treatment: Rehabilitation  THERAPY DIAG:  Muscle weakness (generalized)  Other low back pain  Abnormal posture  Pain in thoracic spine  ONSET DATE: 05/08/22  SUBJECTIVE:                                                                                                                                                                                           SUBJECTIVE STATEMENT: "Ok" PERTINENT HISTORY:  Anxiety, arthritis   PAIN:  Are you having pain? Yes: NPRS scale: 6/10 Pain location: low back and in upper traps  Pain description: dull, constant Aggravating factors: lifting heavy objects Relieving factors: meloxicam,  Tylenol  PRECAUTIONS: None 5 WEIGHT BEARING RESTRICTIONS: No  FALLS:  Has patient fallen in last 6 months? No  LIVING ENVIRONMENT: Lives with: lives alone Lives in: House/apartment Stairs: No Has following equipment at home: None  OCCUPATION: Retired  PLOF: Independent  PATIENT GOALS: to get rid of pain   OBJECTIVE:   DIAGNOSTIC FINDINGS:  X-rays demonstrate no change in the scoliosis    SCREENING FOR RED FLAGS: Bowel or bladder incontinence: No Spinal tumors: No Cauda equina syndrome: No Compression  fracture: No Abdominal aneurysm: No  COGNITION: Overall cognitive status: Within functional limits for tasks assessed     SENSATION: WFL  MUSCLE LENGTH: Hamstrings: mild tightness BLE  POSTURE: rounded shoulders and forward head  PALPATION: Trigger points in bilateral traps and very tight.   LUMBAR ROM:   AROM Eval/ 07/17/22   Flexion Can touch toes, pain coming back up Can touch toes, low back stiffness  Extension 25% with pain WFL some low back stiffness  Right lateral flexion Fib head but pain Fib heas with pain  Left lateral flexion [facility]  WFL  Right rotation 50% 25%  Pain  Left rotation 50%  25% limited    (Blank rows = not tested)  LOWER EXTREMITY ROM:   grossly WFL except R hip flexion is limited to ~105d with pain    LOWER EXTREMITY MMT:    MMT Right eval Left eval Right  07/21/22  Hip flexion 3+ pain in hip 5   Hip extension     Hip abduction 3+ pain 5 4+  Hip adduction     Hip internal rotation     Hip external rotation     Knee flexion 5 5   Knee extension 3+ pain in hip 5 4  Ankle dorsiflexion     Ankle plantarflexion     Ankle inversion     Ankle eversion      (Blank rows = not tested)   FUNCTIONAL TESTS:  5 times sit to stand: 23.40s   TODAY'S TREATMENT:                                                                                                                              DATE:  07/24/22 NuStep L 5 x 6 min UBE L 2 x  2 min each Seated rows 25lb x10, Lats 20lb 2x12 Shoulder Ext 10lb 2x10 Hamstring curls 20lb 2x10  Leg Ext 5lb 2x10 STM to lumbar spine L side and both UT and rhomboids  07/21/22 NuStep L 5 x 6 min Hamstring curls 25lb 2x10 Leg Ext 10lb 2x10 Seated rows 25lb x10, 20lb x10 Shoulder Ext 10lb 2x10 Horiz abd red 2x10 STM to lumbar spine L side and both UT and rhomboids   07/17/22 NuStep L 5 x 7 min Seated rows &  Lats 20lb 2x10 Shoulder Ext 10lb 2x10 S2S Holding yellow ball 2x10 Horizontal abduction red 2x10 Hamstring curls 25lb 2x10 Leg Ext 5lb 2x10 STM to lumbar spine L side   07/15/22 Nustep L 5 Seated rows &  Lats 20lb 2x10 Shoulder Ext 10lb 2x10 Horizontal abduction red 2x10 Shoulder ER red 2x10 Ball vs wall 5 x CW and CCW Ball rolling for lumbar stretch    07/03/22 UBE L2 x 3 min each STM to upper traps and low back. Seated rows &  Lats 20lb 2x10 Shoulder Ext 10lb 2x10 Horizontal abduction red 2x10 Shoulder Er red 2x10   07/01/22 UBE L2 x 3 min  each NuStep L5 x 3 min  STM to upper traps and low back. Horiz shoulder abd red 2x10 Shoulder ER red 2x10 Shoulder Ext 5lb 2x10  06/26/22 Gait around back building one standing rest break. Decrease cadence with uphill ambulation. S2S x10 holding red ball x 10 OHP  Shoulder Ext 10lb 2x10 MHP to lumbar and cervical spine. PROM to LE HS, piriformis, Single K2C, Double K2C, and lumbar rotations   PATIENT EDUCATION:  Education details: HEP and POC Person educated: Patient Education method: Explanation Education comprehension: verbalized understanding  HOME EXERCISE PROGRAM: Access Code: [email or web address] URL: [email or web address] Date: 06/10/2022 Prepared by: Cassie Freer  Exercises - Supine Lower Trunk Rotation  - 1 x daily - 7 x weekly - 2 sets - 10 reps - Supine Bridge  - 1 x daily - 7 x weekly - 2 sets - 10 reps - Supine Single Knee to Chest Stretch  - 2 x daily - 7 x weekly - 30 hold - Doorway  Pec Stretch at 90 Degrees Abduction  - 2 x daily - 7 x weekly - 30 hold - Seated March with Resistance  - 1 x daily - 7 x weekly - 2 sets - 10 reps  ASSESSMENT:  CLINICAL IMPRESSION: Slightly lower baseline pain reported today. Tactiel cues needed to prevent posterior trunk leaning with seated rows. Added UBE without issue. Pt reported a stretch with seated OHP.  Some initial Lumbar tenderness with STM that went away as MT progressed. Cues to hold contraction with leg extensions.    OBJECTIVE IMPAIRMENTS: difficulty walking, decreased ROM, decreased strength, and pain.   REHAB POTENTIAL: Good  CLINICAL DECISION MAKING: Stable/uncomplicated  EVALUATION COMPLEXITY: Low   GOALS: Goals reviewed with patient? Yes  SHORT TERM GOALS: Target date: 07/15/22  Patient will be independent with initial HEP.  Goal status: Met  2.   Patient will demonstrate improved 5xSTS < 15s. Baseline: 23.4s Goal status: Met 15 sec   LONG TERM GOALS: Target date: 08/19/22  Patient will be independent with advanced/ongoing HEP to improve outcomes and carryover.  Goal status: INITIAL  2.  Patient will report 75% improvement in low back pain to improve QOL.  Baseline: 7/10 Goal status: 7/10 ongoing   3.  Patient will demonstrate full pain free lumbar ROM to perform ADLs.   Goal status: Progressing ROM taken last session  4.  Patient will demonstrate improved functional strength as demonstrated by 5/5 in R hip. Baseline: 3+/5 Goal status: 07/21/22 Progressing see above    PLAN:  PT FREQUENCY: 2x/week  PT DURATION: 10 weeks  PLANNED INTERVENTIONS: Therapeutic exercises, Therapeutic activity, Neuromuscular re-education, Balance training, Gait training, Patient/Family education, Self Care, Joint mobilization, Stair training, Dry Needling, Electrical stimulation, Cryotherapy, Moist heat, Traction, Ionotophoresis 4mg /ml Dexamethasone, and Manual therapy.  PLAN FOR NEXT SESSION: low back mobility and  stretching, hip strengthening, maybe DN to upper traps or STM if she denies. ASSESS GOALS    Grayce Sessions, PTA 07/24/2022, 11:49 AM

## 2022-07-29 ENCOUNTER — Encounter: Payer: Self-pay | Admitting: Physical Therapy

## 2022-07-29 ENCOUNTER — Ambulatory Visit: Payer: 59 | Admitting: Physical Therapy

## 2022-07-29 DIAGNOSIS — R293 Abnormal posture: Secondary | ICD-10-CM

## 2022-07-29 DIAGNOSIS — M5459 Other low back pain: Secondary | ICD-10-CM

## 2022-07-29 DIAGNOSIS — M546 Pain in thoracic spine: Secondary | ICD-10-CM

## 2022-07-29 DIAGNOSIS — R600 Localized edema: Secondary | ICD-10-CM

## 2022-07-29 DIAGNOSIS — M6281 Muscle weakness (generalized): Secondary | ICD-10-CM

## 2022-07-29 NOTE — Therapy (Signed)
OUTPATIENT PHYSICAL THERAPY THORACOLUMBAR TREATMENT  Patient Name: Vanessa Blair MRN: [id number] DOB:Dec 13, 1950, 72 y.o., female Today's Date: 07/29/2022  END OF SESSION:  PT End of Session - 07/29/22 1103     Visit Number 12    Date for PT Re-Evaluation 08/19/22    PT Start Time 1103    PT Stop Time 1145    PT Time Calculation (min) 42 min    Activity Tolerance Patient tolerated treatment well    Behavior During Therapy WFL for tasks assessed/performed             Past Medical History:  Diagnosis Date   Acid reflux    Anemia    Anxiety    Arthritis    Hiatal hernia    Hypercholesteremia    Hypertension    Past Surgical History:  Procedure Laterality Date   BREAST SURGERY     NASAL SINUS SURGERY     TOTAL HIP ARTHROPLASTY     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   Procedure: TOTAL KNEE ARTHROPLASTY;  Surgeon: Ollen Gross, MD;  Location: [facility];  Service: Orthopedics;  Laterality: Left;   TUBAL LIGATION     Patient Active Problem List   Diagnosis Date Noted   OA (osteoarthritis) of knee 07/18/2019   Primary osteoarthritis of left knee 07/18/2019    PCP: Ardyth Gal  REFERRING PROVIDER: Venita Lick  REFERRING DIAG:  M54.50 (ICD-10-CM) - Low back pain, unspecified      Rationale for Evaluation and Treatment: Rehabilitation  THERAPY DIAG:  Muscle weakness (generalized)  Other low back pain  Abnormal posture  Pain in thoracic spine  Localized edema  ONSET DATE: 05/08/22  SUBJECTIVE:                                                                                                                                                                                           SUBJECTIVE STATEMENT: "Ok" Just a little pain PERTINENT HISTORY:  Anxiety, arthritis   PAIN:  Are you having pain? Yes: NPRS scale: 6/10 Pain location: low back and in upper traps  Pain description: dull, constant Aggravating factors: lifting heavy objects Relieving  factors: meloxicam, Tylenol  PRECAUTIONS: None 5 WEIGHT BEARING RESTRICTIONS: No  FALLS:  Has patient fallen in last 6 months? No  LIVING ENVIRONMENT: Lives with: lives alone Lives in: House/apartment Stairs: No Has following equipment at home: None  OCCUPATION: Retired  PLOF: Independent  PATIENT GOALS: to get rid of pain   OBJECTIVE:   DIAGNOSTIC FINDINGS:  X-rays demonstrate no change in the scoliosis    SCREENING FOR RED FLAGS: Bowel or bladder incontinence: No Spinal  tumors: No Cauda equina syndrome: No Compression fracture: No Abdominal aneurysm: No  COGNITION: Overall cognitive status: Within functional limits for tasks assessed     SENSATION: WFL  MUSCLE LENGTH: Hamstrings: mild tightness BLE  POSTURE: rounded shoulders and forward head  PALPATION: Trigger points in bilateral traps and very tight.   LUMBAR ROM:   AROM Eval/ 07/17/22   Flexion Can touch toes, pain coming back up Can touch toes, low back stiffness  Extension 25% with pain WFL some low back stiffness  Right lateral flexion Fib head but pain Fib heas with pain  Left lateral flexion [facility]  WFL  Right rotation 50% 25%  Pain  Left rotation 50%  25% limited    (Blank rows = not tested)  LOWER EXTREMITY ROM:   grossly WFL except R hip flexion is limited to ~105d with pain    LOWER EXTREMITY MMT:    MMT Right eval Left eval Right  07/21/22  Hip flexion 3+ pain in hip 5   Hip extension     Hip abduction 3+ pain 5 4+  Hip adduction     Hip internal rotation     Hip external rotation     Knee flexion 5 5   Knee extension 3+ pain in hip 5 4  Ankle dorsiflexion     Ankle plantarflexion     Ankle inversion     Ankle eversion      (Blank rows = not tested)   FUNCTIONAL TESTS:  5 times sit to stand: 23.40s   TODAY'S TREATMENT:                                                                                                                              DATE:  07/29/22 NuStep L  5 x 6 min UBE L 2 x 2 min each Seated rows 25lb x10, Lats 20lb 2x12   S2S OHP with yellow ball 2x10 Leg extensions 5lb 2x10 Hamstring curls 25lb 2x10 Shoulder Ext 10lb 2x10     07/24/22 NuStep L 5 x 6 min UBE L 2 x 2 min each Seated rows 25lb x10, Lats 20lb 2x12 Shoulder Ext 10lb 2x10 Hamstring curls 20lb 2x10  Leg Ext 5lb 2x10 STM to lumbar spine L side and both UT and rhomboids  07/21/22 NuStep L 5 x 6 min Hamstring curls 25lb 2x10 Leg Ext 10lb 2x10 Seated rows 25lb x10, 20lb x10 Shoulder Ext 10lb 2x10 Horiz abd red 2x10 STM to lumbar spine L side and both UT and rhomboids   07/17/22 NuStep L 5 x 7 min Seated rows &  Lats 20lb 2x10 Shoulder Ext 10lb 2x10 S2S Holding yellow ball 2x10 Horizontal abduction red 2x10 Hamstring curls 25lb 2x10 Leg Ext 5lb 2x10 STM to lumbar spine L side   07/15/22 Nustep L 5 Seated rows &  Lats 20lb 2x10 Shoulder Ext 10lb 2x10 Horizontal abduction red 2x10 Shoulder ER red 2x10 Ball vs wall 5 x  CW and CCW Ball rolling for lumbar stretch   PATIENT EDUCATION:  Education details: HEP and POC Person educated: Patient Education method: Explanation Education comprehension: verbalized understanding  HOME EXERCISE PROGRAM: Access Code: [email or web address] URL: [email or web address] Date: 06/10/2022 Prepared by: Cassie Freer  Exercises - Supine Lower Trunk Rotation  - 1 x daily - 7 x weekly - 2 sets - 10 reps - Supine Bridge  - 1 x daily - 7 x weekly - 2 sets - 10 reps - Supine Single Knee to Chest Stretch  - 2 x daily - 7 x weekly - 30 hold - Doorway Pec Stretch at 90 Degrees Abduction  - 2 x daily - 7 x weekly - 30 hold - Seated March with Resistance  - 1 x daily - 7 x weekly - 2 sets - 10 reps  ASSESSMENT:  CLINICAL IMPRESSION: Pt enters doing well. Tactiel cues needed to prevent posterior trunk leaning with seated rows.Fatigue with S2S OHP. Increase resistance tolerated with HS curls.  Cues to hold contraction with leg  extensions. Cue for core engagement needed with shoulder Ext.   OBJECTIVE IMPAIRMENTS: difficulty walking, decreased ROM, decreased strength, and pain.   REHAB POTENTIAL: Good  CLINICAL DECISION MAKING: Stable/uncomplicated  EVALUATION COMPLEXITY: Low   GOALS: Goals reviewed with patient? Yes  SHORT TERM GOALS: Target date: 07/15/22  Patient will be independent with initial HEP.  Goal status: Met  2.   Patient will demonstrate improved 5xSTS < 15s. Baseline: 23.4s Goal status: Met 15 sec   LONG TERM GOALS: Target date: 08/19/22  Patient will be independent with advanced/ongoing HEP to improve outcomes and carryover.  Goal status: INITIAL  2.  Patient will report 75% improvement in low back pain to improve QOL.  Baseline: 7/10 Goal status: 6/10 progressing   3.  Patient will demonstrate full pain free lumbar ROM to perform ADLs.   Goal status: Progressing   4.  Patient will demonstrate improved functional strength as demonstrated by 5/5 in R hip. Baseline: 3+/5 Goal status: 07/21/22 Progressing see above    PLAN:  PT FREQUENCY: 2x/week  PT DURATION: 10 weeks  PLANNED INTERVENTIONS: Therapeutic exercises, Therapeutic activity, Neuromuscular re-education, Balance training, Gait training, Patient/Family education, Self Care, Joint mobilization, Stair training, Dry Needling, Electrical stimulation, Cryotherapy, Moist heat, Traction, Ionotophoresis /ml Dexamethasone, and Manual therapy.  PLAN FOR NEXT SESSION: low back mobility and stretching, hip strengthening, maybe DN to upper traps or STM if she denies. ASSESS GOALS    Grayce Sessions, PTA 07/29/2022, 11:04 AM

## 2022-07-31 ENCOUNTER — Ambulatory Visit: Payer: 59 | Admitting: Physical Therapy

## 2022-07-31 DIAGNOSIS — M5459 Other low back pain: Secondary | ICD-10-CM

## 2022-07-31 DIAGNOSIS — R293 Abnormal posture: Secondary | ICD-10-CM

## 2022-07-31 DIAGNOSIS — M6281 Muscle weakness (generalized): Secondary | ICD-10-CM | POA: Diagnosis not present

## 2022-07-31 NOTE — Therapy (Signed)
OUTPATIENT PHYSICAL THERAPY THORACOLUMBAR TREATMENT  Patient Name: Vanessa Blair MRN: [id number] DOB:17-Apr-1950, 72 y.o., female Today's Date: 07/31/2022  END OF SESSION:  PT End of Session - 07/31/22 1151     Visit Number 13    Date for PT Re-Evaluation 08/19/22    Authorization Type [employer]    PT Start Time 1150    PT Stop Time 1230    PT Time Calculation (min) 40 min             Past Medical History:  Diagnosis Date   Acid reflux    Anemia    Anxiety    Arthritis    Hiatal hernia    Hypercholesteremia    Hypertension    Past Surgical History:  Procedure Laterality Date   BREAST SURGERY     NASAL SINUS SURGERY     TOTAL HIP ARTHROPLASTY     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   Procedure: TOTAL KNEE ARTHROPLASTY;  Surgeon: Ollen Gross, MD;  Location: [facility];  Service: Orthopedics;  Laterality: Left;   TUBAL LIGATION     Patient Active Problem List   Diagnosis Date Noted   OA (osteoarthritis) of knee 07/18/2019   Primary osteoarthritis of left knee 07/18/2019    PCP: Ardyth Gal  REFERRING PROVIDER: Venita Lick  REFERRING DIAG:  M54.50 (ICD-10-CM) - Low back pain, unspecified      Rationale for Evaluation and Treatment: Rehabilitation  THERAPY DIAG:  Muscle weakness (generalized)  Other low back pain  Abnormal posture  ONSET DATE: 05/08/22  SUBJECTIVE:                                                                                                                                                                                           SUBJECTIVE STATEMENT: Neck is really bothering me today. Doing HEP PERTINENT HISTORY:  Anxiety, arthritis   PAIN:  Are you having pain? Yes: NPRS scale: 6/10 Pain location: low back and in upper traps  Pain description: dull, constant Aggravating factors: lifting heavy objects Relieving factors: meloxicam, Tylenol  PRECAUTIONS: None 5 WEIGHT BEARING RESTRICTIONS: No  FALLS:  Has patient fallen in  last 6 months? No  LIVING ENVIRONMENT: Lives with: lives alone Lives in: House/apartment Stairs: No Has following equipment at home: None  OCCUPATION: Retired  PLOF: Independent  PATIENT GOALS: to get rid of pain   OBJECTIVE:   DIAGNOSTIC FINDINGS:  X-rays demonstrate no change in the scoliosis    SCREENING FOR RED FLAGS: Bowel or bladder incontinence: No Spinal tumors: No Cauda equina syndrome: No Compression fracture: No Abdominal aneurysm: No  COGNITION: Overall cognitive status: Within  functional limits for tasks assessed     SENSATION: WFL  MUSCLE LENGTH: Hamstrings: mild tightness BLE  POSTURE: rounded shoulders and forward head  PALPATION: Trigger points in bilateral traps and very tight.   LUMBAR ROM:   AROM Eval/ 07/17/22   Flexion Can touch toes, pain coming back up Can touch toes, low back stiffness  Extension 25% with pain WFL some low back stiffness  Right lateral flexion Fib head but pain Fib heas with pain  Left lateral flexion [facility]  WFL  Right rotation 50% 25%  Pain  Left rotation 50%  25% limited    (Blank rows = not tested)  LOWER EXTREMITY ROM:   grossly WFL except R hip flexion is limited to ~105d with pain    LOWER EXTREMITY MMT:    MMT Right eval Left eval Right  07/21/22  Hip flexion 3+ pain in hip 5   Hip extension     Hip abduction 3+ pain 5 4+  Hip adduction     Hip internal rotation     Hip external rotation     Knee flexion 5 5   Knee extension 3+ pain in hip 5 4  Ankle dorsiflexion     Ankle plantarflexion     Ankle inversion     Ankle eversion      (Blank rows = not tested)   FUNCTIONAL TESTS:  5 times sit to stand: 23.40s   TODAY'S TREATMENT:                                                                                                                              DATE:   07/31/22  UBE L 3 3 min fwd/ 3 min back Deep STW ( with and without theragun)to BIL cerv and trap, TP RT trap Seated Row 25# 2 sets  12 Lat pUll 20# 2 sets 12 S2S OHP with yellow ball 2x10 Leg extensions 5lb 2x10 Hamstring curls 25lb 2x10 Shoulder Ext 10lb 2x10    07/29/22 NuStep L 5 x 6 min UBE L 2 x 2 min each Seated rows 25lb x10, Lats 20lb 2x12   S2S OHP with yellow ball 2x10 Leg extensions 5lb 2x10 Hamstring curls 25lb 2x10 Shoulder Ext 10lb 2x10     07/24/22 NuStep L 5 x 6 min UBE L 2 x 2 min each Seated rows 25lb x10, Lats 20lb 2x12 Shoulder Ext 10lb 2x10 Hamstring curls 20lb 2x10  Leg Ext 5lb 2x10 STM to lumbar spine L side and both UT and rhomboids  07/21/22 NuStep L 5 x 6 min Hamstring curls 25lb 2x10 Leg Ext 10lb 2x10 Seated rows 25lb x10, 20lb x10 Shoulder Ext 10lb 2x10 Horiz abd red 2x10 STM to lumbar spine L side and both UT and rhomboids   07/17/22 NuStep L 5 x 7 min Seated rows &  Lats 20lb 2x10 Shoulder Ext 10lb 2x10 S2S Holding yellow ball 2x10 Horizontal abduction red 2x10 Hamstring curls 25lb 2x10  Leg Ext 5lb 2x10 STM to lumbar spine L side   07/15/22 Nustep L 5 Seated rows &  Lats 20lb 2x10 Shoulder Ext 10lb 2x10 Horizontal abduction red 2x10 Shoulder ER red 2x10 Ball vs wall 5 x CW and CCW Ball rolling for lumbar stretch   PATIENT EDUCATION:  Education details: HEP and POC Person educated: Patient Education method: Explanation Education comprehension: verbalized understanding  HOME EXERCISE PROGRAM: Access Code: [email or web address] URL: [email or web address] Date: 06/10/2022 Prepared by: Cassie Freer  Exercises - Supine Lower Trunk Rotation  - 1 x daily - 7 x weekly - 2 sets - 10 reps - Supine Bridge  - 1 x daily - 7 x weekly - 2 sets - 10 reps - Supine Single Knee to Chest Stretch  - 2 x daily - 7 x weekly - 30 hold - Doorway Pec Stretch at 90 Degrees Abduction  - 2 x daily - 7 x weekly - 30 hold - Seated March with Resistance  - 1 x daily - 7 x weekly - 2 sets - 10 reps  ASSESSMENT:  CLINICAL IMPRESSION:  pt arrived with increased pain in  cerv/thoracic area, STW with and without theragun with relief. Progressed ex wth postural cuing and core engagement. Goals assessed OBJECTIVE IMPAIRMENTS: difficulty walking, decreased ROM, decreased strength, and pain.   REHAB POTENTIAL: Good  CLINICAL DECISION MAKING: Stable/uncomplicated  EVALUATION COMPLEXITY: Low   GOALS: Goals reviewed with patient? Yes  SHORT TERM GOALS: Target date: 07/15/22  Patient will be independent with initial HEP.  Goal status: Met  2.   Patient will demonstrate improved 5xSTS < 15s. Baseline: 23.4s Goal status: Met 15 sec   LONG TERM GOALS: Target date: 08/19/22  Patient will be independent with advanced/ongoing HEP to improve outcomes and carryover.  Goal status: 07/31/22/ progressing  2.  Patient will report 75% improvement in low back pain to improve QOL.  Baseline: 7/10 Goal status: 6/10 progressing  40-50% better 07/31/22  3.  Patient will demonstrate full pain free lumbar ROM to perform ADLs.   Goal status: Progressing 07/31/22  4.  Patient will demonstrate improved functional strength as demonstrated by 5/5 in R hip. Baseline: 3+/5 Goal status: 07/21/22 Progressing see above      PLAN:  PT FREQUENCY: 2x/week  PT DURATION: 10 weeks  PLANNED INTERVENTIONS: Therapeutic exercises, Therapeutic activity, Neuromuscular re-education, Balance training, Gait training, Patient/Family education, Self Care, Joint mobilization, Stair training, Dry Needling, Electrical stimulation, Cryotherapy, Moist heat, Traction, Ionotophoresis /ml Dexamethasone, and Manual therapy.  PLAN FOR NEXT SESSION: MD 5/15    Anamari Galeas,ANGIE, PTA 07/31/2022, 11:52 [facility] [address] [place], [place], [address] Phone: [phone]   Fax:  [phone]  Patient Details  Name: SHARYON PEITZ MRN: [id number] Date of Birth: [birth year] Referring Provider:  Ardyth Gal, MD  Encounter Date:  07/31/2022   Suanne Marker, PTA 07/31/2022, 11:52 AM  [place] [facility] [address] [place], [place], [address] Phone: [phone]   Fax:  [phone]

## 2022-08-05 ENCOUNTER — Ambulatory Visit: Payer: 59 | Admitting: Physical Therapy

## 2022-08-05 DIAGNOSIS — M6281 Muscle weakness (generalized): Secondary | ICD-10-CM

## 2022-08-05 DIAGNOSIS — R293 Abnormal posture: Secondary | ICD-10-CM

## 2022-08-05 DIAGNOSIS — M5459 Other low back pain: Secondary | ICD-10-CM

## 2022-08-05 NOTE — Therapy (Signed)
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   Procedure: TOTAL KNEE ARTHROPLASTY;  Surgeon: Ollen Gross, MD;  Location: [facility];  Service: Orthopedics;  Laterality: Left;   TUBAL LIGATION     Patient Active Problem List   Diagnosis Date Noted   OA (osteoarthritis) of knee 07/18/2019   Primary osteoarthritis of left knee 07/18/2019    PCP: Ardyth Gal  REFERRING PROVIDER: Venita Lick  REFERRING DIAG:  M54.50 (ICD-10-CM) - Low back pain, unspecified      Rationale for Evaluation and Treatment: Rehabilitation  THERAPY DIAG:  Muscle weakness (generalized)  Other low back pain  Abnormal posture  ONSET DATE: 05/08/22  SUBJECTIVE:                                                                                                                                                                                           SUBJECTIVE STATEMENT: Feeling pretty good PERTINENT HISTORY:  Anxiety, arthritis   PAIN:  Are you having pain? Yes: NPRS scale: 3/10 Pain location: low back and in upper traps  Pain description: dull, constant Aggravating factors: lifting heavy objects Relieving factors: meloxicam, Tylenol  PRECAUTIONS: None 5 WEIGHT BEARING RESTRICTIONS: No  FALLS:  Has patient fallen in last 6 months? No  LIVING ENVIRONMENT: Lives with: lives alone Lives in:  House/apartment Stairs: No Has following equipment at home: None  OCCUPATION: Retired  PLOF: Independent  PATIENT GOALS: to get rid of pain   OBJECTIVE:   DIAGNOSTIC FINDINGS:  X-rays demonstrate no change in the scoliosis    SCREENING FOR RED FLAGS: Bowel or bladder incontinence: No Spinal tumors: No Cauda equina syndrome: No Compression fracture: No Abdominal aneurysm: No  COGNITION: Overall cognitive status: Within functional limits for tasks assessed     SENSATION: WFL  MUSCLE LENGTH: Hamstrings: mild tightness BLE  POSTURE: rounded shoulders and forward head  PALPATION: Trigger points in bilateral traps and very tight.   LUMBAR ROM:   AROM Eval/ 07/17/22   Flexion Can touch toes, pain coming back up Can touch toes, low back stiffness  Extension 25% with pain WFL some low back stiffness  Right lateral flexion Fib head but pain Fib heas with pain  Left lateral flexion [facility]  WFL  Right rotation 50% 25%  Pain  Left rotation 50%  25% limited    (Blank rows = not tested)  LOWER EXTREMITY ROM:   grossly WFL except R hip flexion is limited to ~105d with pain    LOWER EXTREMITY MMT:    MMT Right eval Left eval Right  07/21/22  Hip flexion 3+ pain in hip 5   Hip extension     Hip abduction 3+ pain 5 4+  Hip adduction     Hip internal rotation     Hip external rotation     Knee flexion 5 5   Knee extension 3+ pain in hip 5 4  Ankle dorsiflexion     Ankle plantarflexion     Ankle inversion     Ankle eversion      (Blank rows = not tested)   FUNCTIONAL TESTS:  5 times sit to stand: 23.40s   TODAY'S TREATMENT:                                                                                                                              DATE:    08/05/22  UBE L 3 3 min each way 15# bicep curl 2 sets 10 20# tricep curl 2 sets 10 Cable pulley shld ext 10# and row 15# 2 sets 10  Black tband 2 sets 10 trunk ext Lat pull down 20# 2 sets 12 S2S OHP  with yellow ball 2x10 Leg extensions 5lb 2x10 Hamstring curls 25lb 2x10    07/31/22  UBE L 3 3 min fwd/ 3 min back Deep STW ( with and without theragun)to BIL cerv and trap, TP RT trap Seated Row 25# 2 sets 12 Lat pUll 20# 2 sets 12 S2S OHP with yellow ball 2x10 Leg extensions 5lb 2x10 Hamstring curls 25lb 2x10 Shoulder Ext 10lb 2x10    07/29/22 NuStep L 5 x 6 min UBE L 2 x 2 min each Seated rows 25lb x10, Lats 20lb 2x12   S2S OHP with yellow ball 2x10 Leg extensions 5lb 2x10 Hamstring curls 25lb 2x10 Shoulder Ext 10lb 2x10     07/24/22 NuStep L 5 x 6 min UBE L 2 x 2 min each Seated rows 25lb x10, Lats 20lb 2x12 Shoulder Ext 10lb 2x10 Hamstring curls 20lb 2x10  Leg Ext 5lb 2x10 STM to lumbar spine L side and both UT and rhomboids  07/21/22 NuStep L 5 x 6 min Hamstring curls 25lb 2x10 Leg Ext 10lb 2x10 Seated rows 25lb x10, 20lb x10 Shoulder Ext 10lb 2x10 Horiz abd  red 2x10 STM to lumbar spine L side and both UT and rhomboids   07/17/22 NuStep L 5 x 7 min Seated rows &  Lats 20lb 2x10 Shoulder Ext 10lb 2x10 S2S Holding yellow ball 2x10 Horizontal abduction red 2x10 Hamstring curls 25lb 2x10 Leg Ext 5lb 2x10 STM to lumbar spine L side   07/15/22 Nustep L 5 Seated rows &  Lats 20lb 2x10 Shoulder Ext 10lb 2x10 Horizontal abduction red 2x10 Shoulder ER red 2x10 Ball vs wall 5 x CW and CCW Ball rolling for lumbar stretch   PATIENT EDUCATION:  Education details: HEP and POC Person educated: Patient Education method: Explanation Education comprehension: verbalized understanding  HOME EXERCISE PROGRAM: Access Code: [email or web address] URL: [email or web address] Date: 06/10/2022 Prepared by: Vanessa Blair  Exercises - Supine Lower Trunk Rotation  - 1 x daily - 7 x weekly - 2 sets - 10 reps - Supine Bridge  - 1 x daily - 7 x weekly - 2 sets - 10 reps - Supine Single Knee to Chest Stretch  - 2 x daily - 7 x weekly - 30 hold - Doorway Pec Stretch at  90 Degrees Abduction  - 2 x daily - 7 x weekly - 30 hold - Seated March with Resistance  - 1 x daily - 7 x weekly - 2 sets - 10 reps  ASSESSMENT:  CLINICAL IMPRESSION:  Pt arrived feeling pretty good.Progressed ex wth postural cuing and core engagement. Pt states he feels better after ex. OBJECTIVE IMPAIRMENTS: difficulty walking, decreased ROM, decreased strength, and pain.   REHAB POTENTIAL: Good  CLINICAL DECISION MAKING: Stable/uncomplicated  EVALUATION COMPLEXITY: Low   GOALS: Goals reviewed with patient? Yes  SHORT TERM GOALS: Target date: 07/15/22  Patient will be independent with initial HEP.  Goal status: Met  2.   Patient will demonstrate improved 5xSTS < 15s. Baseline: 23.4s Goal status: Met 15 sec   LONG TERM GOALS: Target date: 08/19/22  Patient will be independent with advanced/ongoing HEP to improve outcomes and carryover.  Goal status: 07/31/22/ progressing  2.  Patient will report 75% improvement in low back pain to improve QOL.  Baseline: 7/10 Goal status: 6/10 progressing  40-50% better 07/31/22  3.  Patient will demonstrate full pain free lumbar ROM to perform ADLs.   Goal status: Progressing 07/31/22  4.  Patient will demonstrate improved functional strength as demonstrated by 5/5 in R hip. Baseline: 3+/5 Goal status: 07/21/22 Progressing see above      PLAN:  PT FREQUENCY: 2x/week  PT DURATION: 10 weeks  PLANNED INTERVENTIONS: Therapeutic exercises, Therapeutic activity, Neuromuscular re-education, Balance training, Gait training, Patient/Family education, Self Care, Joint mobilization, Stair training, Dry Needling, Electrical stimulation, Cryotherapy, Moist heat, Traction, Ionotophoresis 4mg /ml Dexamethasone, and Manual therapy.  PLAN FOR NEXT SESSION: MD 5/15. Assess goals    Suanne Marker, PTA 08/05/2022, 11:56 [facility] Outpatient Rehabilitation at [facility] [address] [facility] [place], [place], [address] Phone:  [phone]   Fax:  [phone]  Patient Details  Name: NYKERIA MEALING MRN: [id number] Date of Birth: 1950-12-04 Referring Provider:  Ardyth Gal, MD  Encounter Date: 08/05/2022   Suanne Marker, PTA 08/05/2022, 11:56 AM  [facility] at [facility] [address] [address] [place], [place], [address] Phone: [phone]   Fax:  [phone] at [facility] [address] [place], [place], [address] Phone: [phone]   Fax:  [phone]

## 2022-08-11 NOTE — Therapy (Signed)
OUTPATIENT PHYSICAL THERAPY THORACOLUMBAR TREATMENT  Patient Name: Vanessa Blair MRN: [id number] DOB:[birth year], 72 y.o., female Today's Date: 08/12/2022  END OF SESSION:  PT End of Session - 08/12/22 1150     Visit Number 15    Date for PT Re-Evaluation 08/19/22    Authorization Type [employer]    PT Start Time 1145    PT Stop Time 1230    PT Time Calculation (min) 45 min              Past Medical History:  Diagnosis Date   Acid reflux    Anemia    Anxiety    Arthritis    Hiatal hernia    Hypercholesteremia    Hypertension    Past Surgical History:  Procedure Laterality Date   BREAST SURGERY     NASAL SINUS SURGERY     TOTAL HIP ARTHROPLASTY     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   Procedure: TOTAL KNEE ARTHROPLASTY;  Surgeon: Ollen Gross, MD;  Location: [facility];  Service: Orthopedics;  Laterality: Left;   TUBAL LIGATION     Patient Active Problem List   Diagnosis Date Noted   OA (osteoarthritis) of knee 07/18/2019   Primary osteoarthritis of left knee 07/18/2019    PCP: Ardyth Gal  REFERRING PROVIDER: Venita Lick  REFERRING DIAG:  M54.50 (ICD-10-CM) - Low back pain, unspecified      Rationale for Evaluation and Treatment: Rehabilitation  THERAPY DIAG:  Muscle weakness (generalized)  Other low back pain  Abnormal posture  Pain in thoracic spine  ONSET DATE: 05/08/22  SUBJECTIVE:                                                                                                                                                                                           SUBJECTIVE STATEMENT: Feeling good, I still just feel stiff    PERTINENT HISTORY:  Anxiety, arthritis   PAIN:  Are you having pain? Yes: NPRS scale: 6/10 Pain location: low back and in upper traps  Pain description: dull, constant Aggravating factors: lifting heavy objects Relieving factors: meloxicam, Tylenol  PRECAUTIONS: None 5 WEIGHT BEARING RESTRICTIONS:  No  FALLS:  Has patient fallen in last 6 months? No  LIVING ENVIRONMENT: Lives with: lives alone Lives in: House/apartment Stairs: No Has following equipment at home: None  OCCUPATION: Retired  PLOF: Independent  PATIENT GOALS: to get rid of pain   OBJECTIVE:   DIAGNOSTIC FINDINGS:  X-rays demonstrate no change in the scoliosis    SCREENING FOR RED FLAGS: Bowel or bladder incontinence: No Spinal tumors: No Cauda equina syndrome: No Compression fracture: No Abdominal  aneurysm: No  COGNITION: Overall cognitive status: Within functional limits for tasks assessed     SENSATION: WFL  MUSCLE LENGTH: Hamstrings: mild tightness BLE  POSTURE: rounded shoulders and forward head  PALPATION: Trigger points in bilateral traps and very tight.   LUMBAR ROM:   AROM Eval/ 07/17/22   Flexion Can touch toes, pain coming back up Can touch toes, low back stiffness  Extension 25% with pain WFL some low back stiffness  Right lateral flexion Fib head but pain Fib heas with pain  Left lateral flexion [facility]  WFL  Right rotation 50% 25%  Pain  Left rotation 50%  25% limited    (Blank rows = not tested)  LOWER EXTREMITY ROM:   grossly WFL except R hip flexion is limited to ~105d with pain    LOWER EXTREMITY MMT:    MMT Right eval Left eval Right  07/21/22  Hip flexion 3+ pain in hip 5   Hip extension     Hip abduction 3+ pain 5 4+  Hip adduction     Hip internal rotation     Hip external rotation     Knee flexion 5 5   Knee extension 3+ pain in hip 5 4  Ankle dorsiflexion     Ankle plantarflexion     Ankle inversion     Ankle eversion      (Blank rows = not tested)   FUNCTIONAL TESTS:  5 times sit to stand: 23.40s   TODAY'S TREATMENT:                                                                                                                              DATE:  08/12/22 NuStep L4 x56mins  Shoulder ext 10# 2x10 Rows and lats 25# 2x10  S2S OHP on airex 2x10   STM to upper traps and neck  Thoracic ext over half foam roll 2x10  08/05/22 UBE L 3 3 min each way 15# bicep curl 2 sets 10 20# tricep curl 2 sets 10 Cable pulley shld ext 10# and row 15# 2 sets 10  Black tband 2 sets 10 trunk ext Lat pull down 20# 2 sets 12 S2S OHP with yellow ball 2x10 Leg extensions 5lb 2x10 Hamstring curls 25lb 2x10    07/31/22  UBE L 3 3 min fwd/ 3 min back Deep STW ( with and without theragun)to BIL cerv and trap, TP RT trap Seated Row 25# 2 sets 12 Lat pUll 20# 2 sets 12 S2S OHP with yellow ball 2x10 Leg extensions 5lb 2x10 Hamstring curls 25lb 2x10 Shoulder Ext 10lb 2x10    07/29/22 NuStep L 5 x 6 min UBE L 2 x 2 min each Seated rows 25lb x10, Lats 20lb 2x12   S2S OHP with yellow ball 2x10 Leg extensions 5lb 2x10 Hamstring curls 25lb 2x10 Shoulder Ext 10lb 2x10     07/24/22 NuStep L 5 x 6 min UBE L 2 x 2 min  each Seated rows 25lb x10, Lats 20lb 2x12 Shoulder Ext 10lb 2x10 Hamstring curls 20lb 2x10  Leg Ext 5lb 2x10 STM to lumbar spine L side and both UT and rhomboids  07/21/22 NuStep L 5 x 6 min Hamstring curls 25lb 2x10 Leg Ext 10lb 2x10 Seated rows 25lb x10, 20lb x10 Shoulder Ext 10lb 2x10 Horiz abd red 2x10 STM to lumbar spine L side and both UT and rhomboids   07/17/22 NuStep L 5 x 7 min Seated rows &  Lats 20lb 2x10 Shoulder Ext 10lb 2x10 S2S Holding yellow ball 2x10 Horizontal abduction red 2x10 Hamstring curls 25lb 2x10 Leg Ext 5lb 2x10 STM to lumbar spine L side   07/15/22 Nustep L 5 Seated rows &  Lats 20lb 2x10 Shoulder Ext 10lb 2x10 Horizontal abduction red 2x10 Shoulder ER red 2x10 Ball vs wall 5 x CW and CCW Ball rolling for lumbar stretch   PATIENT EDUCATION:  Education details: HEP and POC Person educated: Patient Education method: Explanation Education comprehension: verbalized understanding  HOME EXERCISE PROGRAM: Access Code: [email or web address] URL: [email or web address] Date:  06/10/2022 Prepared by: Cassie Freer  Exercises - Supine Lower Trunk Rotation  - 1 x daily - 7 x weekly - 2 sets - 10 reps - Supine Bridge  - 1 x daily - 7 x weekly - 2 sets - 10 reps - Supine Single Knee to Chest Stretch  - 2 x daily - 7 x weekly - 30 hold - Doorway Pec Stretch at 90 Degrees Abduction  - 2 x daily - 7 x weekly - 30 hold - Seated March with Resistance  - 1 x daily - 7 x weekly - 2 sets - 10 reps  ASSESSMENT:  CLINICAL IMPRESSION:  Pt arrived with some pain in shoulder and thoracic spine. Progressed exercises with postural cuing and back strengthening. Pt states she feels better after ex. Does have a big trigger point in R upper trap.    OBJECTIVE IMPAIRMENTS: difficulty walking, decreased ROM, decreased strength, and pain.   REHAB POTENTIAL: Good  CLINICAL DECISION MAKING: Stable/uncomplicated  EVALUATION COMPLEXITY: Low   GOALS: Goals reviewed with patient? Yes  SHORT TERM GOALS: Target date: 07/15/22  Patient will be independent with initial HEP.  Goal status: Met  2.   Patient will demonstrate improved 5xSTS < 15s. Baseline: 23.4s Goal status: Met 15 sec   LONG TERM GOALS: Target date: 08/19/22  Patient will be independent with advanced/ongoing HEP to improve outcomes and carryover.  Goal status: 07/31/22/ progressing  2.  Patient will report 75% improvement in low back pain to improve QOL.  Baseline: 7/10 Goal status: 6/10 progressing  40-50% better 07/31/22  3.  Patient will demonstrate full pain free lumbar ROM to perform ADLs.   Goal status: Progressing 07/31/22  4.  Patient will demonstrate improved functional strength as demonstrated by 5/5 in R hip. Baseline: 3+/5 Goal status: 07/21/22 Progressing see above      PLAN:  PT FREQUENCY: 2x/week  PT DURATION: 10 weeks  PLANNED INTERVENTIONS: Therapeutic exercises, Therapeutic activity, Neuromuscular re-education, Balance training, Gait training, Patient/Family education, Self Care, Joint  mobilization, Stair training, Dry Needling, Electrical stimulation, Cryotherapy, Moist heat, Traction, Ionotophoresis 4mg /ml Dexamethasone, and Manual therapy.  PLAN FOR NEXT SESSION: MD 5/15. Assess goals    Cassie Freer, PT 08/12/2022, 12:30 [facility] Outpatient Rehabilitation at [facility] [address] [facility] [place], [place], [address] Phone: [phone]   Fax:  [phone]  Patient Details  Name: Vanessa Blair  MRN: [id number] Date of Birth: February 27, 1951 Referring Provider:  Ardyth Gal, MD  Encounter Date: 08/12/2022   Cassie Freer, PT 08/12/2022, 12:30 PM  [place] [facility] at [facility] [address] [facility] [address] [place], [place], [address] Phone: [phone]   Fax:  [phone] at [facility] [address] [facility] [address] [place], [place], [address] Phone: [phone]   Fax:  [phone]

## 2022-08-12 ENCOUNTER — Ambulatory Visit: Payer: 59 | Attending: Orthopedic Surgery

## 2022-08-12 DIAGNOSIS — M5459 Other low back pain: Secondary | ICD-10-CM | POA: Diagnosis present

## 2022-08-12 DIAGNOSIS — M546 Pain in thoracic spine: Secondary | ICD-10-CM | POA: Diagnosis present

## 2022-08-12 DIAGNOSIS — R293 Abnormal posture: Secondary | ICD-10-CM | POA: Insufficient documentation

## 2022-08-12 DIAGNOSIS — M6281 Muscle weakness (generalized): Secondary | ICD-10-CM | POA: Diagnosis present

## 2022-08-12 DIAGNOSIS — R600 Localized edema: Secondary | ICD-10-CM | POA: Insufficient documentation

## 2022-08-14 ENCOUNTER — Encounter: Payer: Self-pay | Admitting: Physical Therapy

## 2022-08-14 ENCOUNTER — Ambulatory Visit: Payer: 59 | Admitting: Physical Therapy

## 2022-08-14 DIAGNOSIS — M6281 Muscle weakness (generalized): Secondary | ICD-10-CM

## 2022-08-14 DIAGNOSIS — M546 Pain in thoracic spine: Secondary | ICD-10-CM

## 2022-08-14 DIAGNOSIS — R293 Abnormal posture: Secondary | ICD-10-CM

## 2022-08-14 DIAGNOSIS — R600 Localized edema: Secondary | ICD-10-CM

## 2022-08-14 DIAGNOSIS — M5459 Other low back pain: Secondary | ICD-10-CM

## 2022-08-14 NOTE — Therapy (Signed)
OUTPATIENT PHYSICAL THERAPY THORACOLUMBAR TREATMENT  Patient Name: Vanessa Blair MRN: [id number] DOB:06/22/1950, 72 y.o., female Today's Date: 08/14/2022  END OF SESSION:  PT End of Session - 08/14/22 1302     Visit Number 16    Date for PT Re-Evaluation 08/19/22    PT Start Time 1302    PT Stop Time 1345    PT Time Calculation (min) 43 min    Activity Tolerance Patient tolerated treatment well    Behavior During Therapy WFL for tasks assessed/performed              Past Medical History:  Diagnosis Date   Acid reflux    Anemia    Anxiety    Arthritis    Hiatal hernia    Hypercholesteremia    Hypertension    Past Surgical History:  Procedure Laterality Date   BREAST SURGERY     NASAL SINUS SURGERY     TOTAL HIP ARTHROPLASTY     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   Procedure: TOTAL KNEE ARTHROPLASTY;  Surgeon: Ollen Gross, MD;  Location: [facility];  Service: Orthopedics;  Laterality: Left;   TUBAL LIGATION     Patient Active Problem List   Diagnosis Date Noted   OA (osteoarthritis) of knee 07/18/2019   Primary osteoarthritis of left knee 07/18/2019    PCP: Ardyth Gal  REFERRING PROVIDER: Venita Lick  REFERRING DIAG:  M54.50 (ICD-10-CM) - Low back pain, unspecified      Rationale for Evaluation and Treatment: Rehabilitation  THERAPY DIAG:  Muscle weakness (generalized)  Abnormal posture  Pain in thoracic spine  Localized edema  Other low back pain  ONSET DATE: 05/08/22  SUBJECTIVE:                                                                                                                                                                                           SUBJECTIVE STATEMENT: "I been all right"   PERTINENT HISTORY:  Anxiety, arthritis   PAIN:  Are you having pain? Yes: NPRS scale: 5/10 Pain location: low back and in upper traps  Pain description: dull, constant Aggravating factors: lifting heavy objects Relieving  factors: meloxicam, Tylenol  PRECAUTIONS: None 5 WEIGHT BEARING RESTRICTIONS: No  FALLS:  Has patient fallen in last 6 months? No  LIVING ENVIRONMENT: Lives with: lives alone Lives in: House/apartment Stairs: No Has following equipment at home: None  OCCUPATION: Retired  PLOF: Independent  PATIENT GOALS: to get rid of pain   OBJECTIVE:   DIAGNOSTIC FINDINGS:  X-rays demonstrate no change in the scoliosis    SCREENING FOR RED FLAGS: Bowel or bladder incontinence:  No Spinal tumors: No Cauda equina syndrome: No Compression fracture: No Abdominal aneurysm: No  COGNITION: Overall cognitive status: Within functional limits for tasks assessed     SENSATION: WFL  MUSCLE LENGTH: Hamstrings: mild tightness BLE  POSTURE: rounded shoulders and forward head  PALPATION: Trigger points in bilateral traps and very tight.   LUMBAR ROM:   AROM Eval/ 07/17/22   Flexion Can touch toes, pain coming back up Can touch toes, low back stiffness  Extension 25% with pain WFL some low back stiffness  Right lateral flexion Fib head but pain Fib heas with pain  Left lateral flexion [facility]  WFL  Right rotation 50% 25%  Pain  Left rotation 50%  25% limited    (Blank rows = not tested)  LOWER EXTREMITY ROM:   grossly WFL except R hip flexion is limited to ~105d with pain    LOWER EXTREMITY MMT:    MMT Right eval Left eval Right  07/21/22  Hip flexion 3+ pain in hip 5   Hip extension     Hip abduction 3+ pain 5 4+  Hip adduction     Hip internal rotation     Hip external rotation     Knee flexion 5 5   Knee extension 3+ pain in hip 5 4  Ankle dorsiflexion     Ankle plantarflexion     Ankle inversion     Ankle eversion      (Blank rows = not tested)   FUNCTIONAL TESTS:  5 times sit to stand: 23.40s   TODAY'S TREATMENT:                                                                                                                              DATE:  08/14/22 NuStep L4  x28mins  HS curls 25lb 2x10 Leg 10lb 2x10 S2S holding yellow ball  Shoulder Ext 10 standing on airex 2x10  Rows and lats 25# 2x10  STM to upper traps, low back, and neck   08/12/22 NuStep L4 x40mins  Shoulder ext 10# 2x10 Rows and lats 25# 2x10  S2S OHP on airex 2x10  STM to upper traps and neck  Thoracic ext over half foam roll 2x10  08/05/22 UBE L 3 3 min each way 15# bicep curl 2 sets 10 20# tricep curl 2 sets 10 Cable pulley shld ext 10# and row 15# 2 sets 10  Black tband 2 sets 10 trunk ext Lat pull down 20# 2 sets 12 S2S OHP with yellow ball 2x10 Leg extensions 5lb 2x10 Hamstring curls 25lb 2x10    07/31/22  UBE L 3 3 min fwd/ 3 min back Deep STW ( with and without theragun)to BIL cerv and trap, TP RT trap Seated Row 25# 2 sets 12 Lat pUll 20# 2 sets 12 S2S OHP with yellow ball 2x10 Leg extensions 5lb 2x10 Hamstring curls 25lb 2x10 Shoulder Ext 10lb 2x10    07/29/22 NuStep L 5 x  6 min UBE L 2 x 2 min each Seated rows 25lb x10, Lats 20lb 2x12   S2S OHP with yellow ball 2x10 Leg extensions 5lb 2x10 Hamstring curls 25lb 2x10 Shoulder Ext 10lb 2x10     07/24/22 NuStep L 5 x 6 min UBE L 2 x 2 min each Seated rows 25lb x10, Lats 20lb 2x12 Shoulder Ext 10lb 2x10 Hamstring curls 20lb 2x10  Leg Ext 5lb 2x10 STM to lumbar spine L side and both UT and rhomboids  07/21/22 NuStep L 5 x 6 min Hamstring curls 25lb 2x10 Leg Ext 10lb 2x10 Seated rows 25lb x10, 20lb x10 Shoulder Ext 10lb 2x10 Horiz abd red 2x10 STM to lumbar spine L side and both UT and rhomboids   07/17/22 NuStep L 5 x 7 min Seated rows &  Lats 20lb 2x10 Shoulder Ext 10lb 2x10 S2S Holding yellow ball 2x10 Horizontal abduction red 2x10 Hamstring curls 25lb 2x10 Leg Ext 5lb 2x10 STM to lumbar spine L side   07/15/22 Nustep L 5 Seated rows &  Lats 20lb 2x10 Shoulder Ext 10lb 2x10 Horizontal abduction red 2x10 Shoulder ER red 2x10 Ball vs wall 5 x CW and CCW Ball rolling for lumbar  stretch   PATIENT EDUCATION:  Education details: HEP and POC Person educated: Patient Education method: Explanation Education comprehension: verbalized understanding  HOME EXERCISE PROGRAM: Access Code: [email or web address] URL: [email or web address] Date: 06/10/2022 Prepared by: Cassie Freer  Exercises - Supine Lower Trunk Rotation  - 1 x daily - 7 x weekly - 2 sets - 10 reps - Supine Bridge  - 1 x daily - 7 x weekly - 2 sets - 10 reps - Supine Single Knee to Chest Stretch  - 2 x daily - 7 x weekly - 30 hold - Doorway Pec Stretch at 90 Degrees Abduction  - 2 x daily - 7 x weekly - 30 hold - Seated March with Resistance  - 1 x daily - 7 x weekly - 2 sets - 10 reps  ASSESSMENT:  CLINICAL IMPRESSION:  Pt arrived feeling ok. Continued to progressed exercises with postural cuing and back strengthening. Pt states she feels better after ex and STM. Does have a big trigger point in R upper trap. No increase in pain with activity   OBJECTIVE IMPAIRMENTS: difficulty walking, decreased ROM, decreased strength, and pain.   REHAB POTENTIAL: Good  CLINICAL DECISION MAKING: Stable/uncomplicated  EVALUATION COMPLEXITY: Low   GOALS: Goals reviewed with patient? Yes  SHORT TERM GOALS: Target date: 07/15/22  Patient will be independent with initial HEP.  Goal status: Met  2.   Patient will demonstrate improved 5xSTS < 15s. Baseline: 23.4s Goal status: Met 15 sec   LONG TERM GOALS: Target date: 08/19/22  Patient will be independent with advanced/ongoing HEP to improve outcomes and carryover.  Goal status: 07/31/22/ progressing  2.  Patient will report 75% improvement in low back pain to improve QOL.  Baseline: 7/10 Goal status: 6/10 progressing  40-50% better 07/31/22  3.  Patient will demonstrate full pain free lumbar ROM to perform ADLs.   Goal status: Progressing 07/31/22  4.  Patient will demonstrate improved functional strength as demonstrated by 5/5 in R hip. Baseline:  3+/5 Goal status: 07/21/22 Progressing see above      PLAN:  PT FREQUENCY: 2x/week  PT DURATION: 10 weeks  PLANNED INTERVENTIONS: Therapeutic exercises, Therapeutic activity, Neuromuscular re-education, Balance training, Gait training, Patient/Family education, Self Care, Joint mobilization, Stair training, Dry Needling, Electrical stimulation, Cryotherapy, Moist heat, Traction,  Ionotophoresis 4mg /ml Dexamethasone, and Manual therapy.  PLAN FOR NEXT SESSION: MD 5/15. Assess goals    Grayce Sessions, PTA 08/14/2022,

## 2022-08-19 ENCOUNTER — Ambulatory Visit: Payer: 59 | Admitting: Physical Therapy

## 2022-08-19 DIAGNOSIS — M6281 Muscle weakness (generalized): Secondary | ICD-10-CM | POA: Diagnosis not present

## 2022-08-19 DIAGNOSIS — M5459 Other low back pain: Secondary | ICD-10-CM

## 2022-08-19 DIAGNOSIS — M546 Pain in thoracic spine: Secondary | ICD-10-CM

## 2022-08-19 DIAGNOSIS — R293 Abnormal posture: Secondary | ICD-10-CM

## 2022-08-19 NOTE — Therapy (Signed)
OUTPATIENT PHYSICAL THERAPY THORACOLUMBAR TREATMENT  Patient Name: Vanessa Blair MRN: [id number] DOB:Aug 16, 1950, 72 y.o., female Today's Date: 08/19/2022  END OF SESSION:  PT End of Session - 08/19/22 1155     Visit Number 17    Date for PT Re-Evaluation 09/19/22    PT Start Time 1155    PT Stop Time 1245    PT Time Calculation (min) 50 min              Past Medical History:  Diagnosis Date   Acid reflux    Anemia    Anxiety    Arthritis    Hiatal hernia    Hypercholesteremia    Hypertension    Past Surgical History:  Procedure Laterality Date   BREAST SURGERY     NASAL SINUS SURGERY     TOTAL HIP ARTHROPLASTY     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   Procedure: TOTAL KNEE ARTHROPLASTY;  Surgeon: Ollen Gross, MD;  Location: [facility];  Service: Orthopedics;  Laterality: Left;   TUBAL LIGATION     Patient Active Problem List   Diagnosis Date Noted   OA (osteoarthritis) of knee 07/18/2019   Primary osteoarthritis of left knee 07/18/2019    PCP: Ardyth Gal  REFERRING PROVIDER: Venita Lick  REFERRING DIAG:  M54.50 (ICD-10-CM) - Low back pain, unspecified      Rationale for Evaluation and Treatment: Rehabilitation  THERAPY DIAG:  Muscle weakness (generalized)  Abnormal posture  Pain in thoracic spine  Other low back pain  ONSET DATE: 05/08/22  SUBJECTIVE:                                                                                                                                                                                           SUBJECTIVE STATEMENT: MD cancelled appt for tomorrow and will call to reschedule. Overall getting better but still hurts esp the more I do like housework   PERTINENT HISTORY:  Anxiety, arthritis   PAIN:  Are you having pain? Yes: NPRS scale: 5/10 Pain location: low back and in upper traps  Pain description: dull, constant Aggravating factors: lifting heavy objects Relieving factors: meloxicam,  Tylenol  PRECAUTIONS: None 5 WEIGHT BEARING RESTRICTIONS: No  FALLS:  Has patient fallen in last 6 months? No  LIVING ENVIRONMENT: Lives with: lives alone Lives in: House/apartment Stairs: No Has following equipment at home: None  OCCUPATION: Retired  PLOF: Independent  PATIENT GOALS: to get rid of pain   OBJECTIVE:   DIAGNOSTIC FINDINGS:  X-rays demonstrate no change in the scoliosis    SCREENING FOR RED FLAGS: Bowel or bladder incontinence: No Spinal tumors:  No Cauda equina syndrome: No Compression fracture: No Abdominal aneurysm: No  COGNITION: Overall cognitive status: Within functional limits for tasks assessed     SENSATION: WFL  MUSCLE LENGTH: Hamstrings: mild tightness BLE  POSTURE: rounded shoulders and forward head  PALPATION: Trigger points in bilateral traps and very tight.   LUMBAR ROM:   AROM Eval/ 07/17/22   Flexion Can touch toes, pain coming back up Can touch toes, low back stiffness  Extension 25% with pain WFL some low back stiffness  Right lateral flexion Fib head but pain Fib heas with pain  Left lateral flexion [facility]  WFL  Right rotation 50% 25%  Pain  Left rotation 50%  25% limited    (Blank rows = not tested)  LOWER EXTREMITY ROM:   grossly WFL except R hip flexion is limited to ~105d with pain    LOWER EXTREMITY MMT:    MMT Right eval Left eval Right  07/21/22 RT 08/19/22  Hip flexion 3+ pain in hip 5    Hip extension      Hip abduction 3+ pain 5 4+ 4+  Hip adduction      Hip internal rotation      Hip external rotation      Knee flexion 5 5    Knee extension 3+ pain in hip 5 4 4+  Ankle dorsiflexion      Ankle plantarflexion      Ankle inversion      Ankle eversion       (Blank rows = not tested)   FUNCTIONAL TESTS:  5 times sit to stand: 23.40s   TODAY'S TREATMENT:                                                                                                                              DATE:    08/19/22 UBE 2 min each way Rows and lats 25# 2x10  Wt ball OH ext 10 x the trunk rotation 10 x each way Shld ext with cable pulleys 2 sets 10 10# Upright row into ext 10# 2 sets 10 DN BIL UT by M[employer] PT STW to BIL UT MH to BIL UT sitting 10 min to help with DN soreness   08/14/22 NuStep L4 x47mins  HS curls 25lb 2x10 Leg 10lb 2x10 S2S holding yellow ball  Shoulder Ext 10 standing on airex 2x10  Rows and lats 25# 2x10  STM to upper traps, low back, and neck   08/12/22 NuStep L4 x55mins  Shoulder ext 10# 2x10 Rows and lats 25# 2x10  S2S OHP on airex 2x10  STM to upper traps and neck  Thoracic ext over half foam roll 2x10  08/05/22 UBE L 3 3 min each way 15# bicep curl 2 sets 10 20# tricep curl 2 sets 10 Cable pulley shld ext 10# and row 15# 2 sets 10  Black tband 2 sets 10 trunk ext Lat pull down 20# 2 sets 12 S2S OHP  with yellow ball 2x10 Leg extensions 5lb 2x10 Hamstring curls 25lb 2x10    07/31/22  UBE L 3 3 min fwd/ 3 min back Deep STW ( with and without theragun)to BIL cerv and trap, TP RT trap Seated Row 25# 2 sets 12 Lat pUll 20# 2 sets 12 S2S OHP with yellow ball 2x10 Leg extensions 5lb 2x10 Hamstring curls 25lb 2x10 Shoulder Ext 10lb 2x10    07/29/22 NuStep L 5 x 6 min UBE L 2 x 2 min each Seated rows 25lb x10, Lats 20lb 2x12   S2S OHP with yellow ball 2x10 Leg extensions 5lb 2x10 Hamstring curls 25lb 2x10 Shoulder Ext 10lb 2x10     07/24/22 NuStep L 5 x 6 min UBE L 2 x 2 min each Seated rows 25lb x10, Lats 20lb 2x12 Shoulder Ext 10lb 2x10 Hamstring curls 20lb 2x10  Leg Ext 5lb 2x10 STM to lumbar spine L side and both UT and rhomboids  07/21/22 NuStep L 5 x 6 min Hamstring curls 25lb 2x10 Leg Ext 10lb 2x10 Seated rows 25lb x10, 20lb x10 Shoulder Ext 10lb 2x10 Horiz abd red 2x10 STM to lumbar spine L side and both UT and rhomboids   07/17/22 NuStep L 5 x 7 min Seated rows &  Lats 20lb 2x10 Shoulder Ext 10lb 2x10 S2S Holding  yellow ball 2x10 Horizontal abduction red 2x10 Hamstring curls 25lb 2x10 Leg Ext 5lb 2x10 STM to lumbar spine L side   07/15/22 Nustep L 5 Seated rows &  Lats 20lb 2x10 Shoulder Ext 10lb 2x10 Horizontal abduction red 2x10 Shoulder ER red 2x10 Ball vs wall 5 x CW and CCW Ball rolling for lumbar stretch   PATIENT EDUCATION:  Education details: HEP and POC Person educated: Patient Education method: Explanation Education comprehension: verbalized understanding  HOME EXERCISE PROGRAM: Access Code: [email or web address] URL: [email or web address] Date: 06/10/2022 Prepared by: Cassie Freer  Exercises - Supine Lower Trunk Rotation  - 1 x daily - 7 x weekly - 2 sets - 10 reps - Supine Bridge  - 1 x daily - 7 x weekly - 2 sets - 10 reps - Supine Single Knee to Chest Stretch  - 2 x daily - 7 x weekly - 30 hold - Doorway Pec Stretch at 90 Degrees Abduction  - 2 x daily - 7 x weekly - 30 hold - Seated March with Resistance  - 1 x daily - 7 x weekly - 2 sets - 10 reps  ASSESSMENT:  CLINICAL IMPRESSION:  pt was to see MD tomorrow by states their office canceled and she is awaiting new appt. Pt states 60% better and PT is helping but still achey and pain after ADLS. Progressing with goals. BIL spinal tightness RT > Left esp RT UT. OBJECTIVE IMPAIRMENTS: difficulty walking, decreased ROM, decreased strength, and pain.   REHAB POTENTIAL: Good  CLINICAL DECISION MAKING: Stable/uncomplicated  EVALUATION COMPLEXITY: Low   GOALS: Goals reviewed with patient? Yes  SHORT TERM GOALS: Target date: 07/15/22  Patient will be independent with initial HEP.  Goal status: Met  2.   Patient will demonstrate improved 5xSTS < 15s. Baseline: 23.4s Goal status: Met 15 sec   LONG TERM GOALS: Target date: 08/19/22  Patient will be independent with advanced/ongoing HEP to improve outcomes and carryover.  Goal status: 07/31/22/ progressing  08/19/22 progressing  2.  Patient will report 75%  improvement in low back pain to improve QOL.  Baseline: 7/10 Goal status: 6/10 progressing  40-50% better 07/31/22  60% 08/19/22  3.  Patient will demonstrate full pain free lumbar ROM to perform ADLs.   Goal status: Progressing 07/31/22  Progressing 08/19/22  4.  Patient will demonstrate improved functional strength as demonstrated by 5/5 in R hip. Baseline: 3+/5 Goal status: 07/21/22 Progressing see above   08/19/22 progressing   PLAN:  PT FREQUENCY: 2x/week  PT DURATION: 10 weeks  PLANNED INTERVENTIONS: Therapeutic exercises, Therapeutic activity, Neuromuscular re-education, Balance training, Gait training, Patient/Family education, Self Care, Joint mobilization, Stair training, Dry Needling, Electrical stimulation, Cryotherapy, Moist heat, Traction, Ionotophoresis 4mg /ml Dexamethasone, and Manual therapy.  PLAN FOR NEXT SESSION: renewal done    Daemien Fronczak,ANGIE, PTA 08/19/2022, [facility] at [facility] [address] [facility]. [place], [place], [address] Phone: [phone]   Fax:  [phone]  Patient Details  Name: KARI AQUILA MRN: [id number] Date of Birth: [birth year] Referring Provider:  Ardyth Gal, MD  Encounter Date: 08/19/2022   Suanne Marker, PTA 08/19/2022, 12:22 PM  [place]  Outpatient Rehabilitation at [facility] [address] [facility]. [place], [place], [address] Phone: [phone]   Fax:  [phone]

## 2022-08-21 ENCOUNTER — Ambulatory Visit: Payer: 59 | Admitting: Physical Therapy

## 2022-08-21 ENCOUNTER — Encounter: Payer: Self-pay | Admitting: Physical Therapy

## 2022-08-21 DIAGNOSIS — M6281 Muscle weakness (generalized): Secondary | ICD-10-CM

## 2022-08-21 DIAGNOSIS — M5459 Other low back pain: Secondary | ICD-10-CM

## 2022-08-21 DIAGNOSIS — R293 Abnormal posture: Secondary | ICD-10-CM

## 2022-08-21 DIAGNOSIS — M546 Pain in thoracic spine: Secondary | ICD-10-CM

## 2022-08-21 NOTE — Therapy (Signed)
OUTPATIENT PHYSICAL THERAPY THORACOLUMBAR TREATMENT  Patient Name: Vanessa Blair MRN: [id number] DOB:[birth year], 72 y.o., female Today's Date: 08/21/2022  END OF SESSION:  PT End of Session - 08/21/22 1155     Visit Number 18    Date for PT Re-Evaluation 09/19/22    PT Start Time 1152    PT Stop Time 1230    PT Time Calculation (min) 38 min    Activity Tolerance Patient tolerated treatment well    Behavior During Therapy WFL for tasks assessed/performed              Past Medical History:  Diagnosis Date   Acid reflux    Anemia    Anxiety    Arthritis    Hiatal hernia    Hypercholesteremia    Hypertension    Past Surgical History:  Procedure Laterality Date   BREAST SURGERY     NASAL SINUS SURGERY     TOTAL HIP ARTHROPLASTY     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   Procedure: TOTAL KNEE ARTHROPLASTY;  Surgeon: Ollen Gross, MD;  Location: [facility];  Service: Orthopedics;  Laterality: Left;   TUBAL LIGATION     Patient Active Problem List   Diagnosis Date Noted   OA (osteoarthritis) of knee 07/18/2019   Primary osteoarthritis of left knee 07/18/2019    PCP: Ardyth Gal  REFERRING PROVIDER: Venita Lick  REFERRING DIAG:  M54.50 (ICD-10-CM) - Low back pain, unspecified      Rationale for Evaluation and Treatment: Rehabilitation  THERAPY DIAG:  Muscle weakness (generalized)  Abnormal posture  Pain in thoracic spine  Other low back pain  ONSET DATE: 05/08/22  SUBJECTIVE:                                                                                                                                                                                           SUBJECTIVE STATEMENT: "Im ok"   PERTINENT HISTORY:  Anxiety, arthritis   PAIN:  Are you having pain? Yes: NPRS scale: 4/10 Pain location: low back and in upper traps  Pain description: dull, constant Aggravating factors: lifting heavy objects Relieving factors: meloxicam,  Tylenol  PRECAUTIONS: None 5 WEIGHT BEARING RESTRICTIONS: No  FALLS:  Has patient fallen in last 6 months? No  LIVING ENVIRONMENT: Lives with: lives alone Lives in: House/apartment Stairs: No Has following equipment at home: None  OCCUPATION: Retired  PLOF: Independent  PATIENT GOALS: to get rid of pain   OBJECTIVE:   DIAGNOSTIC FINDINGS:  X-rays demonstrate no change in the scoliosis    SCREENING FOR RED FLAGS: Bowel or bladder incontinence: No Spinal tumors: No Cauda  equina syndrome: No Compression fracture: No Abdominal aneurysm: No  COGNITION: Overall cognitive status: Within functional limits for tasks assessed     SENSATION: WFL  MUSCLE LENGTH: Hamstrings: mild tightness BLE  POSTURE: rounded shoulders and forward head  PALPATION: Trigger points in bilateral traps and very tight.   LUMBAR ROM:   AROM Eval/ 07/17/22   Flexion Can touch toes, pain coming back up Can touch toes, low back stiffness  Extension 25% with pain WFL some low back stiffness  Right lateral flexion Fib head but pain Fib heas with pain  Left lateral flexion [facility]  WFL  Right rotation 50% 25%  Pain  Left rotation 50%  25% limited    (Blank rows = not tested)  LOWER EXTREMITY ROM:   grossly WFL except R hip flexion is limited to ~105d with pain    LOWER EXTREMITY MMT:    MMT Right eval Left eval Right  07/21/22 RT 08/19/22  Hip flexion 3+ pain in hip 5    Hip extension      Hip abduction 3+ pain 5 4+ 4+  Hip adduction      Hip internal rotation      Hip external rotation      Knee flexion 5 5    Knee extension 3+ pain in hip 5 4 4+  Ankle dorsiflexion      Ankle plantarflexion      Ankle inversion      Ankle eversion       (Blank rows = not tested)   FUNCTIONAL TESTS:  5 times sit to stand: 23.40s   TODAY'S TREATMENT:                                                                                                                              DATE:   08/21/22 UBE 2 min each way HS curls 25lb 2x15 Leg Ext 5lb 2x15 Shoulder Ext 10lb 2x10  Row & Lats 25lb 2x10 S2S holding yellow ball 2x10 NuStep L5 x 6 min  08/19/22 UBE 2 min each way Rows and lats 25# 2x10  Wt ball OH ext 10 x the trunk rotation 10 x each way Shld ext with cable pulleys 2 sets 10 10# Upright row into ext 10# 2 sets 10 DN BIL UT by [facility] PT STW to BIL UT MH to BIL UT sitting 10 min to help with DN soreness   08/14/22 NuStep L4 x26mins  HS curls 25lb 2x10 Leg 10lb 2x10 S2S holding yellow ball  Shoulder Ext 10 standing on airex 2x10  Rows and lats 25# 2x10  STM to upper traps, low back, and neck   08/12/22 NuStep L4 x33mins  Shoulder ext 10# 2x10 Rows and lats 25# 2x10  S2S OHP on airex 2x10  STM to upper traps and neck  Thoracic ext over half foam roll 2x10  08/05/22 UBE L 3 3 min each way 15# bicep curl 2 sets 10 20# tricep  curl 2 sets 10 Cable pulley shld ext 10# and row 15# 2 sets 10  Black tband 2 sets 10 trunk ext Lat pull down 20# 2 sets 12 S2S OHP with yellow ball 2x10 Leg extensions 5lb 2x10 Hamstring curls 25lb 2x10    07/31/22  UBE L 3 3 min fwd/ 3 min back Deep STW ( with and without theragun)to BIL cerv and trap, TP RT trap Seated Row 25# 2 sets 12 Lat pUll 20# 2 sets 12 S2S OHP with yellow ball 2x10 Leg extensions 5lb 2x10 Hamstring curls 25lb 2x10 Shoulder Ext 10lb 2x10    07/29/22 NuStep L 5 x 6 min UBE L 2 x 2 min each Seated rows 25lb x10, Lats 20lb 2x12   S2S OHP with yellow ball 2x10 Leg extensions 5lb 2x10 Hamstring curls 25lb 2x10 Shoulder Ext 10lb 2x10     07/24/22 NuStep L 5 x 6 min UBE L 2 x 2 min each Seated rows 25lb x10, Lats 20lb 2x12 Shoulder Ext 10lb 2x10 Hamstring curls 20lb 2x10  Leg Ext 5lb 2x10 STM to lumbar spine L side and both UT and rhomboids  07/21/22 NuStep L 5 x 6 min Hamstring curls 25lb 2x10 Leg Ext 10lb 2x10 Seated rows 25lb x10, 20lb x10 Shoulder Ext 10lb 2x10 Horiz abd red  2x10 STM to lumbar spine L side and both UT and rhomboids   07/17/22 NuStep L 5 x 7 min Seated rows &  Lats 20lb 2x10 Shoulder Ext 10lb 2x10 S2S Holding yellow ball 2x10 Horizontal abduction red 2x10 Hamstring curls 25lb 2x10 Leg Ext 5lb 2x10 STM to lumbar spine L side   07/15/22 Nustep L 5 Seated rows &  Lats 20lb 2x10 Shoulder Ext 10lb 2x10 Horizontal abduction red 2x10 Shoulder ER red 2x10 Ball vs wall 5 x CW and CCW Ball rolling for lumbar stretch   PATIENT EDUCATION:  Education details: HEP and POC Person educated: Patient Education method: Explanation Education comprehension: verbalized understanding  HOME EXERCISE PROGRAM: Access Code: [email or web address] URL: [email or web address] Date: 06/10/2022 Prepared by: Cassie Freer  Exercises - Supine Lower Trunk Rotation  - 1 x daily - 7 x weekly - 2 sets - 10 reps - Supine Bridge  - 1 x daily - 7 x weekly - 2 sets - 10 reps - Supine Single Knee to Chest Stretch  - 2 x daily - 7 x weekly - 30 hold - Doorway Pec Stretch at 90 Degrees Abduction  - 2 x daily - 7 x weekly - 30 hold - Seated March with Resistance  - 1 x daily - 7 x weekly - 2 sets - 10 reps  ASSESSMENT:  CLINICAL IMPRESSION:  Pt ~ 9 minutes late for today's session.  Progressing with goals. Some fatigue noted with sit to stands. Increase reps tolerated with leg curls and extensions. No reports of pain during session. Postural cue needed with shoulder Ext.  OBJECTIVE IMPAIRMENTS: difficulty walking, decreased ROM, decreased strength, and pain.   REHAB POTENTIAL: Good  CLINICAL DECISION MAKING: Stable/uncomplicated  EVALUATION COMPLEXITY: Low   GOALS: Goals reviewed with patient? Yes  SHORT TERM GOALS: Target date: 07/15/22  Patient will be independent with initial HEP.  Goal status: Met  2.   Patient will demonstrate improved 5xSTS < 15s. Baseline: 23.4s Goal status: Met 15 sec   LONG TERM GOALS: Target date: 08/19/22  Patient will be  independent with advanced/ongoing HEP to improve outcomes and carryover.  Goal status: 07/31/22/ progressing  08/19/22 progressing  2.  Patient will report 75% improvement in low back pain to improve QOL.  Baseline: 7/10 Goal status: 6/10 progressing  40-50% better 07/31/22  60% 08/19/22  3.  Patient will demonstrate full pain free lumbar ROM to perform ADLs.   Goal status: Progressing 07/31/22  Progressing 08/19/22  4.  Patient will demonstrate improved functional strength as demonstrated by 5/5 in R hip. Baseline: 3+/5 Goal status: 07/21/22 Progressing see above   08/19/22 progressing   PLAN:  PT FREQUENCY: 2x/week  PT DURATION: 10 weeks  PLANNED INTERVENTIONS: Therapeutic exercises, Therapeutic activity, Neuromuscular re-education, Balance training, Gait training, Patient/Family education, Self Care, Joint mobilization, Stair training, Dry Needling, Electrical stimulation, Cryotherapy, Moist heat, Traction, Ionotophoresis 4mg /ml Dexamethasone, and Manual therapy.  PLAN FOR NEXT SESSION: renewal done    Grayce Sessions, PTA 08/21/2022, [facility]

## 2022-08-26 ENCOUNTER — Ambulatory Visit: Payer: 59 | Admitting: Physical Therapy

## 2022-08-26 DIAGNOSIS — M6281 Muscle weakness (generalized): Secondary | ICD-10-CM

## 2022-08-26 DIAGNOSIS — M5459 Other low back pain: Secondary | ICD-10-CM

## 2022-08-26 DIAGNOSIS — M546 Pain in thoracic spine: Secondary | ICD-10-CM

## 2022-08-26 DIAGNOSIS — R293 Abnormal posture: Secondary | ICD-10-CM

## 2022-08-26 NOTE — Therapy (Deleted)
[facility] at [facility] [address] [facility] [place], [place], [address] Phone: [phone]   Fax:  [phone]  Patient Details  Name: SHANEIL TAPIA MRN: [id number] Date of Birth: [birth year] Referring Provider:  Ardyth Gal, MD  Encounter Date: 08/26/2022   Suanne Marker, PTA 08/26/2022, 10:21 AM  [place] [facility] at [facility] [address] [place], [place], [address] Phone: [phone]   Fax:  [phone]

## 2022-08-26 NOTE — Therapy (Signed)
OUTPATIENT PHYSICAL THERAPY THORACOLUMBAR TREATMENT  Patient Name: Vanessa Blair MRN: [id number] DOB:03-11-1951, 72 y.o., female Today's Date: 08/26/2022  END OF SESSION:  PT End of Session - 08/26/22 1021     Visit Number 19    Date for PT Re-Evaluation 09/19/22    Authorization Type [employer]    PT Start Time 1020    PT Stop Time 1100    PT Time Calculation (min) 40 min              Past Medical History:  Diagnosis Date   Acid reflux    Anemia    Anxiety    Arthritis    Hiatal hernia    Hypercholesteremia    Hypertension    Past Surgical History:  Procedure Laterality Date   BREAST SURGERY     NASAL SINUS SURGERY     TOTAL HIP ARTHROPLASTY     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   Procedure: TOTAL KNEE ARTHROPLASTY;  Surgeon: Ollen Gross, MD;  Location: [facility];  Service: Orthopedics;  Laterality: Left;   TUBAL LIGATION     Patient Active Problem List   Diagnosis Date Noted   OA (osteoarthritis) of knee 07/18/2019   Primary osteoarthritis of left knee 07/18/2019    PCP: Ardyth Gal  REFERRING PROVIDER: Venita Lick  REFERRING DIAG:  M54.50 (ICD-10-CM) - Low back pain, unspecified      Rationale for Evaluation and Treatment: Rehabilitation  THERAPY DIAG:  Muscle weakness (generalized)  Abnormal posture  Pain in thoracic spine  Other low back pain  ONSET DATE: 05/08/22  SUBJECTIVE:                                                                                                                                                                                           SUBJECTIVE STATEMENT: "Im ok"   PERTINENT HISTORY:  Anxiety, arthritis   PAIN:  Are you having pain? Yes: NPRS scale: 4/10 Pain location: low back and in upper traps  Pain description: dull, constant Aggravating factors: lifting heavy objects Relieving factors: meloxicam, Tylenol  PRECAUTIONS: None 5 WEIGHT BEARING RESTRICTIONS: No  FALLS:  Has patient fallen in last  6 months? No  LIVING ENVIRONMENT: Lives with: lives alone Lives in: House/apartment Stairs: No Has following equipment at home: None  OCCUPATION: Retired  PLOF: Independent  PATIENT GOALS: to get rid of pain   OBJECTIVE:   DIAGNOSTIC FINDINGS:  X-rays demonstrate no change in the scoliosis    SCREENING FOR RED FLAGS: Bowel or bladder incontinence: No Spinal tumors: No Cauda equina syndrome: No Compression fracture: No Abdominal aneurysm: No  COGNITION: Overall cognitive  status: Within functional limits for tasks assessed     SENSATION: WFL  MUSCLE LENGTH: Hamstrings: mild tightness BLE  POSTURE: rounded shoulders and forward head  PALPATION: Trigger points in bilateral traps and very tight.   LUMBAR ROM:   AROM Eval/ 07/17/22   Flexion Can touch toes, pain coming back up Can touch toes, low back stiffness  Extension 25% with pain WFL some low back stiffness  Right lateral flexion Fib head but pain Fib heas with pain  Left lateral flexion [facility]  WFL  Right rotation 50% 25%  Pain  Left rotation 50%  25% limited    (Blank rows = not tested)  LOWER EXTREMITY ROM:   grossly WFL except R hip flexion is limited to ~105d with pain    LOWER EXTREMITY MMT:    MMT Right eval Left eval Right  07/21/22 RT 08/19/22  Hip flexion 3+ pain in hip 5    Hip extension      Hip abduction 3+ pain 5 4+ 4+  Hip adduction      Hip internal rotation      Hip external rotation      Knee flexion 5 5    Knee extension 3+ pain in hip 5 4 4+  Ankle dorsiflexion      Ankle plantarflexion      Ankle inversion      Ankle eversion       (Blank rows = not tested)   FUNCTIONAL TESTS:  5 times sit to stand: 23.40s   TODAY'S TREATMENT:                                                                                                                              DATE:   08/26/22 UBE L 3 3 min each way NuStep L5 x 6 min HS curls 25lb 2x15 Leg Ext 5lb 2x15 Shoulder Ext 10lb 2x10   Row & Lats 25lb 2x10 Black tband trunk ext 20x S2S holding yellow ball 2x10    08/21/22 UBE 2 min each way HS curls 25lb 2x15 Leg Ext 5lb 2x15 Shoulder Ext 10lb 2x10  Row & Lats 25lb 2x10 S2S holding yellow ball 2x10 NuStep L5 x 6 min  08/19/22 UBE 2 min each way Rows and lats 25# 2x10  Wt ball OH ext 10 x the trunk rotation 10 x each way Shld ext with cable pulleys 2 sets 10 10# Upright row into ext 10# 2 sets 10 DN BIL UT by [employer] PT STW to BIL UT MH to BIL UT sitting 10 min to help with DN soreness   08/14/22 NuStep L4 x27mins  HS curls 25lb 2x10 Leg 10lb 2x10 S2S holding yellow ball  Shoulder Ext 10 standing on airex 2x10  Rows and lats 25# 2x10  STM to upper traps, low back, and neck   08/12/22 NuStep L4 x48mins  Shoulder ext 10# 2x10 Rows and lats 25# 2x10  S2S OHP on airex 2x10  STM to upper traps and neck  Thoracic ext over half foam roll 2x10  08/05/22 UBE L 3 3 min each way 15# bicep curl 2 sets 10 20# tricep curl 2 sets 10 Cable pulley shld ext 10# and row 15# 2 sets 10  Black tband 2 sets 10 trunk ext Lat pull down 20# 2 sets 12 S2S OHP with yellow ball 2x10 Leg extensions 5lb 2x10 Hamstring curls 25lb 2x10    07/31/22  UBE L 3 3 min fwd/ 3 min back Deep STW ( with and without theragun)to BIL cerv and trap, TP RT trap Seated Row 25# 2 sets 12 Lat pUll 20# 2 sets 12 S2S OHP with yellow ball 2x10 Leg extensions 5lb 2x10 Hamstring curls 25lb 2x10 Shoulder Ext 10lb 2x10    07/29/22 NuStep L 5 x 6 min UBE L 2 x 2 min each Seated rows 25lb x10, Lats 20lb 2x12   S2S OHP with yellow ball 2x10 Leg extensions 5lb 2x10 Hamstring curls 25lb 2x10 Shoulder Ext 10lb 2x10     07/24/22 NuStep L 5 x 6 min UBE L 2 x 2 min each Seated rows 25lb x10, Lats 20lb 2x12 Shoulder Ext 10lb 2x10 Hamstring curls 20lb 2x10  Leg Ext 5lb 2x10 STM to lumbar spine L side and both UT and rhomboids  07/21/22 NuStep L 5 x 6 min Hamstring curls 25lb 2x10 Leg  Ext 10lb 2x10 Seated rows 25lb x10, 20lb x10 Shoulder Ext 10lb 2x10 Horiz abd red 2x10 STM to lumbar spine L side and both UT and rhomboids   07/17/22 NuStep L 5 x 7 min Seated rows &  Lats 20lb 2x10 Shoulder Ext 10lb 2x10 S2S Holding yellow ball 2x10 Horizontal abduction red 2x10 Hamstring curls 25lb 2x10 Leg Ext 5lb 2x10 STM to lumbar spine L side   07/15/22 Nustep L 5 Seated rows &  Lats 20lb 2x10 Shoulder Ext 10lb 2x10 Horizontal abduction red 2x10 Shoulder ER red 2x10 Ball vs wall 5 x CW and CCW Ball rolling for lumbar stretch   PATIENT EDUCATION:  Education details: HEP and POC Person educated: Patient Education method: Explanation Education comprehension: verbalized understanding  HOME EXERCISE PROGRAM: Access Code: [email or web address] URL: [email or web address] Date: 06/10/2022 Prepared by: Cassie Freer  Exercises - Supine Lower Trunk Rotation  - 1 x daily - 7 x weekly - 2 sets - 10 reps - Supine Bridge  - 1 x daily - 7 x weekly - 2 sets - 10 reps - Supine Single Knee to Chest Stretch  - 2 x daily - 7 x weekly - 30 hold - Doorway Pec Stretch at 90 Degrees Abduction  - 2 x daily - 7 x weekly - 30 hold - Seated March with Resistance  - 1 x daily - 7 x weekly - 2 sets - 10 reps  ASSESSMENT:  CLINICAL IMPRESSION:  Pt ~ 5 minutes late for today's session.  Tightness noted in UT and shlds. No reports of pain during session. Postural cueing with ex for posture and core activation  OBJECTIVE IMPAIRMENTS: difficulty walking, decreased ROM, decreased strength, and pain.   REHAB POTENTIAL: Good  CLINICAL DECISION MAKING: Stable/uncomplicated  EVALUATION COMPLEXITY: Low   GOALS: Goals reviewed with patient? Yes  SHORT TERM GOALS: Target date: 07/15/22  Patient will be independent with initial HEP.  Goal status: Met  2.   Patient will demonstrate improved 5xSTS < 15s. Baseline: 23.4s Goal status: Met 15 sec   LONG TERM GOALS: Target date:  08/19/22  Patient will be independent with advanced/ongoing HEP to improve outcomes and carryover.  Goal status: 07/31/22/ progressing  08/19/22 progressing  2.  Patient will report 75% improvement in low back pain to improve QOL.  Baseline: 7/10 Goal status: 6/10 progressing  40-50% better 07/31/22  60% 08/19/22  3.  Patient will demonstrate full pain free lumbar ROM to perform ADLs.   Goal status: Progressing 07/31/22  Progressing 08/19/22  4.  Patient will demonstrate improved functional strength as demonstrated by 5/5 in R hip. Baseline: 3+/5 Goal status: 07/21/22 Progressing see above   08/19/22 progressing   PLAN:  PT FREQUENCY: 2x/week  PT DURATION: 10 weeks  PLANNED INTERVENTIONS: Therapeutic exercises, Therapeutic activity, Neuromuscular re-education, Balance training, Gait training, Patient/Family education, Self Care, Joint mobilization, Stair training, Dry Needling, Electrical stimulation, Cryotherapy, Moist heat, Traction, Ionotophoresis 4mg /ml Dexamethasone, and Manual therapy.  PLAN FOR NEXT SESSION: 20th visit. MD appt still awaiting to hear from MD    [facility], PTA 08/26/2022, [facility] at [facility] [address] [facility]. [place], [place], [address] Phone: [phone]   Fax:  [phone]  Patient Details  Name: Vanessa Blair MRN: [id number] Date of Birth: Jun 29, 1950 Referring Provider:  Ardyth Gal, MD  Encounter Date: 08/26/2022   Suanne Marker, PTA 08/26/2022, 10:24 AM  [place]  [facility] at [facility] [address] [facility] [facility] [address] [place], [place], [address] Phone: [phone]   Fax:  [phone]

## 2022-08-28 ENCOUNTER — Ambulatory Visit: Payer: 59 | Admitting: Physical Therapy

## 2022-08-28 DIAGNOSIS — M546 Pain in thoracic spine: Secondary | ICD-10-CM

## 2022-08-28 DIAGNOSIS — M6281 Muscle weakness (generalized): Secondary | ICD-10-CM | POA: Diagnosis not present

## 2022-08-28 DIAGNOSIS — R293 Abnormal posture: Secondary | ICD-10-CM

## 2022-08-28 NOTE — Therapy (Signed)
[facility] Rehabilitation at [facility] [place], [place], [address] Phone: [phone]   Fax:  [phone]  Patient Details  Name: Vanessa Blair MRN: [id number] Date of Birth: 1950-08-13 OUTPATIENT PHYSICAL THERAPY THORACOLUMBAR TREATMENT Progress Note Reporting Period 07/24/22 to 08/28/22  See note below for Objective Data and Assessment of Progress/Goals.     Patient Name: Vanessa Blair MRN: [id number] DOB:1950/04/28, 72 y.o., female Today's Date: 08/28/2022  END OF SESSION:  PT End of Session - 08/28/22 1107     Visit Number 20    Date for PT Re-Evaluation 09/19/22    Authorization Type [employer]    PT Start Time 1107    PT Stop Time 1145    PT Time Calculation (min) 38 min              Past Medical History:  Diagnosis Date   Acid reflux    Anemia    Anxiety    Arthritis    Hiatal hernia    Hypercholesteremia    Hypertension    Past Surgical History:  Procedure Laterality Date   BREAST SURGERY     NASAL SINUS SURGERY     TOTAL HIP ARTHROPLASTY     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   Procedure: TOTAL KNEE ARTHROPLASTY;  Surgeon: Ollen Gross, MD;  Location: [facility];  Service: Orthopedics;  Laterality: Left;   TUBAL LIGATION     Patient Active Problem List   Diagnosis Date Noted   OA (osteoarthritis) of knee 07/18/2019   Primary osteoarthritis of left knee 07/18/2019    PCP: Ardyth Gal  REFERRING PROVIDER: Venita Lick  REFERRING DIAG:  M54.50 (ICD-10-CM) - Low back pain, unspecified      Rationale for Evaluation and Treatment: Rehabilitation  THERAPY DIAG:  Muscle weakness (generalized)  Abnormal posture  Pain in thoracic spine  ONSET DATE: 05/08/22  SUBJECTIVE:                                                                                                                                                                                           SUBJECTIVE STATEMENT: "Im ok", late.  About 50% better, still some rough days   PERTINENT HISTORY:  Anxiety, arthritis   PAIN:  Are you having pain? Yes: NPRS scale: 6/10 Pain location: mid /upper back and in upper traps  Pain description: dull, constant Aggravating factors: lifting heavy objects Relieving factors: meloxicam, Tylenol  PRECAUTIONS: None 5 WEIGHT BEARING RESTRICTIONS: No  FALLS:  Has patient fallen in last 6 months? No  LIVING ENVIRONMENT: Lives with: lives alone Lives in: House/apartment Stairs: No  Has following equipment at home: None  OCCUPATION: Retired  PLOF: Independent  PATIENT GOALS: to get rid of pain   OBJECTIVE:   DIAGNOSTIC FINDINGS:  X-rays demonstrate no change in the scoliosis    SCREENING FOR RED FLAGS: Bowel or bladder incontinence: No Spinal tumors: No Cauda equina syndrome: No Compression fracture: No Abdominal aneurysm: No  COGNITION: Overall cognitive status: Within functional limits for tasks assessed     SENSATION: WFL  MUSCLE LENGTH: Hamstrings: mild tightness BLE  POSTURE: rounded shoulders and forward head  PALPATION: Trigger points in bilateral traps and very tight.   LUMBAR ROM:   AROM Eval/ 07/17/22   Flexion Can touch toes, pain coming back up Can touch toes, low back stiffness  Extension 25% with pain WFL some low back stiffness  Right lateral flexion Fib head but pain Fib heas with pain  Left lateral flexion [facility]  WFL  Right rotation 50% 25%  Pain  Left rotation 50%  25% limited    (Blank rows = not tested)  LOWER EXTREMITY ROM:   grossly WFL except R hip flexion is limited to ~105d with pain    LOWER EXTREMITY MMT:    MMT Right eval Left eval Right  07/21/22 RT 08/19/22  Hip flexion 3+ pain in hip 5    Hip extension      Hip abduction 3+ pain 5 4+ 4+  Hip adduction      Hip internal rotation      Hip external rotation      Knee flexion 5 5    Knee extension 3+ pain in hip 5 4 4+  Ankle dorsiflexion      Ankle  plantarflexion      Ankle inversion      Ankle eversion       (Blank rows = not tested)   FUNCTIONAL TESTS:  5 times sit to stand: 23.40s   TODAY'S TREATMENT:                                                                                                                              DATE:   08/28/22 Nustep L 5 6 min Standing yellow wt ball chest press, trunk ext and rotation 10 x each Row & Lats 25lb 2x10 Black tband trunk ext 20x Upright row into trunk ext 15# 2 sets 10 Cable pulley shld ext 2 sets 10 Cable pulley AR press 10 each DN uppertraps and Left rhomboid by MAlbright PT  08/26/22 UBE L 3 3 min each way NuStep L5 x 6 min HS curls 25lb 2x15 Leg Ext 5lb 2x15 Shoulder Ext 10lb 2x10  Row & Lats 25lb 2x10 Black tband trunk ext 20x S2S holding yellow ball 2x10    08/21/22 UBE 2 min each way HS curls 25lb 2x15 Leg Ext 5lb 2x15 Shoulder Ext 10lb 2x10  Row & Lats 25lb 2x10 S2S holding yellow ball 2x10 NuStep L5 x 6 min  08/19/22 UBE 2  min each way Rows and lats 25# 2x10  Wt ball OH ext 10 x the trunk rotation 10 x each way Shld ext with cable pulleys 2 sets 10 10# Upright row into ext 10# 2 sets 10 DN BIL UT by MAlbright PT STW to BIL UT MH to BIL UT sitting 10 min to help with DN soreness   08/14/22 NuStep L4 x31mins  HS curls 25lb 2x10 Leg 10lb 2x10 S2S holding yellow ball  Shoulder Ext 10 standing on airex 2x10  Rows and lats 25# 2x10  STM to upper traps, low back, and neck   08/12/22 NuStep L4 x34mins  Shoulder ext 10# 2x10 Rows and lats 25# 2x10  S2S OHP on airex 2x10  STM to upper traps and neck  Thoracic ext over half foam roll 2x10  08/05/22 UBE L 3 3 min each way 15# bicep curl 2 sets 10 20# tricep curl 2 sets 10 Cable pulley shld ext 10# and row 15# 2 sets 10  Black tband 2 sets 10 trunk ext Lat pull down 20# 2 sets 12 S2S OHP with yellow ball 2x10 Leg extensions 5lb 2x10 Hamstring curls 25lb 2x10    07/31/22  UBE L 3 3 min fwd/ 3  min back Deep STW ( with and without theragun)to BIL cerv and trap, TP RT trap Seated Row 25# 2 sets 12 Lat pUll 20# 2 sets 12 S2S OHP with yellow ball 2x10 Leg extensions 5lb 2x10 Hamstring curls 25lb 2x10 Shoulder Ext 10lb 2x10    07/29/22 NuStep L 5 x 6 min UBE L 2 x 2 min each Seated rows 25lb x10, Lats 20lb 2x12   S2S OHP with yellow ball 2x10 Leg extensions 5lb 2x10 Hamstring curls 25lb 2x10 Shoulder Ext 10lb 2x10     07/24/22 NuStep L 5 x 6 min UBE L 2 x 2 min each Seated rows 25lb x10, Lats 20lb 2x12 Shoulder Ext 10lb 2x10 Hamstring curls 20lb 2x10  Leg Ext 5lb 2x10 STM to lumbar spine L side and both UT and rhomboids  07/21/22 NuStep L 5 x 6 min Hamstring curls 25lb 2x10 Leg Ext 10lb 2x10 Seated rows 25lb x10, 20lb x10 Shoulder Ext 10lb 2x10 Horiz abd red 2x10 STM to lumbar spine L side and both UT and rhomboids   07/17/22 NuStep L 5 x 7 min Seated rows &  Lats 20lb 2x10 Shoulder Ext 10lb 2x10 S2S Holding yellow ball 2x10 Horizontal abduction red 2x10 Hamstring curls 25lb 2x10 Leg Ext 5lb 2x10 STM to lumbar spine L side   07/15/22 Nustep L 5 Seated rows &  Lats 20lb 2x10 Shoulder Ext 10lb 2x10 Horizontal abduction red 2x10 Shoulder ER red 2x10 Ball vs wall 5 x CW and CCW Ball rolling for lumbar stretch   PATIENT EDUCATION:  Education details: HEP and POC Person educated: Patient Education method: Explanation Education comprehension: verbalized understanding  HOME EXERCISE PROGRAM: Access Code: [email or web address] URL: [email or web address] Date: 06/10/2022 Prepared by: Cassie Freer  Exercises - Supine Lower Trunk Rotation  - 1 x daily - 7 x weekly - 2 sets - 10 reps - Supine Bridge  - 1 x daily - 7 x weekly - 2 sets - 10 reps - Supine Single Knee to Chest Stretch  - 2 x daily - 7 x weekly - 30 hold - Doorway Pec Stretch at 90 Degrees Abduction  - 2 x daily - 7 x weekly - 30 hold - Seated March with Resistance  - 1 x daily -  7 x  weekly - 2 sets - 10 reps  ASSESSMENT:  CLINICAL IMPRESSION:  Pt ~ 7 minutes late for today's session.  Addressed goals and all are progressing. Pt states overall about 50% better but still has some rough days. Tightness traps and TP left rhomboid so DN to address tightness. OBJECTIVE IMPAIRMENTS: difficulty walking, decreased ROM, decreased strength, and pain.   REHAB POTENTIAL: Good  CLINICAL DECISION MAKING: Stable/uncomplicated  EVALUATION COMPLEXITY: Low   GOALS: Goals reviewed with patient? Yes  SHORT TERM GOALS: Target date: 07/15/22  Patient will be independent with initial HEP.  Goal status: Met  2.   Patient will demonstrate improved 5xSTS < 15s. Baseline: 23.4s Goal status: Met 15 sec   LONG TERM GOALS: Target date: 08/19/22  Patient will be independent with advanced/ongoing HEP to improve outcomes and carryover.  Goal status: 07/31/22/ progressing  08/19/22 progressing   08/28/22 progressing  2.  Patient will report 75% improvement in low back pain to improve QOL.  Baseline: 7/10 Goal status: 6/10 progressing  40-50% better 07/31/22  60% 08/19/22  08/28/22 progressing  3.  Patient will demonstrate full pain free lumbar ROM to perform ADLs.   Goal status: Progressing 07/31/22  Progressing 08/19/22   08/28/22 progressing  4.  Patient will demonstrate improved functional strength as demonstrated by 5/5 in R hip. Baseline: 3+/5 Goal status: 07/21/22 Progressing see above   08/19/22 progressing  08/28/22 progressing   PLAN:  PT FREQUENCY: 2x/week  PT DURATION: 10 weeks  PLANNED INTERVENTIONS: Therapeutic exercises, Therapeutic activity, Neuromuscular re-education, Balance training, Gait training, Patient/Family education, Self Care, Joint mobilization, Stair training, Dry Needling, Electrical stimulation, Cryotherapy, Moist heat, Traction, Ionotophoresis 4mg /ml Dexamethasone, and Manual therapy.  PLAN FOR NEXT SESSION: 20th visit complete ( no active FOTO) MD appt still  awaiting to hear from MD    [facility], PTA 08/28/2022, [facility] at [facility] [facility]. [place], [place], [address] Phone: [phone]   Fax:  [phone]  Patient Details  Name: Vanessa Blair MRN: [id number] Date of Birth: 24-Nov-1950 Referring Provider:  Ardyth Gal, MD  Encounter Date: 08/28/2022  Suanne Marker, PTA 08/28/2022, 11:08 AM  [place] [facility] at [facility] [address] [place], [place], [address] Phone: [phone]   Fax:  [phone] Referring Provider:  Ardyth Gal, MD  Encounter Date: 08/28/2022   Suanne Marker, PTA 08/28/2022, 11:08 AM  [place] [facility] at [facility] [address] [place], [place], [address] Phone: [phone]   Fax:  [phone]

## 2022-09-02 ENCOUNTER — Ambulatory Visit: Payer: 59 | Admitting: Physical Therapy

## 2022-09-04 ENCOUNTER — Ambulatory Visit: Payer: 59 | Admitting: Physical Therapy

## 2022-09-04 ENCOUNTER — Encounter: Payer: Self-pay | Admitting: Physical Therapy

## 2022-09-04 DIAGNOSIS — R293 Abnormal posture: Secondary | ICD-10-CM

## 2022-09-04 DIAGNOSIS — M6281 Muscle weakness (generalized): Secondary | ICD-10-CM

## 2022-09-04 DIAGNOSIS — R600 Localized edema: Secondary | ICD-10-CM

## 2022-09-04 DIAGNOSIS — M546 Pain in thoracic spine: Secondary | ICD-10-CM

## 2022-09-04 DIAGNOSIS — M5459 Other low back pain: Secondary | ICD-10-CM

## 2022-09-04 NOTE — Therapy (Signed)
[facility] [facility] at [facility]. [place], [place], [address] Phone: [phone]   Fax:  [phone]  Patient Details  Name: RAYAHNA VIEN MRN: [id number] Date of Birth: 06-May-1950 OUTPATIENT PHYSICAL THERAPY THORACOLUMBAR TREATMENT Progress Note Reporting Period 07/24/22 to 08/28/22  See note below for Objective Data and Assessment of Progress/Goals.     Patient Name: CARMINDY ROCHLIN MRN: [id number] DOB:1950/12/31, 72 y.o., female Today's Date: 09/04/2022  END OF SESSION:  PT End of Session - 09/04/22 1150     Visit Number 21    Date for PT Re-Evaluation 09/19/22    PT Start Time 1150    PT Stop Time 1230    PT Time Calculation (min) 40 min    Activity Tolerance Patient tolerated treatment well    Behavior During Therapy WFL for tasks assessed/performed              Past Medical History:  Diagnosis Date   Acid reflux    Anemia    Anxiety    Arthritis    Hiatal hernia    Hypercholesteremia    Hypertension    Past Surgical History:  Procedure Laterality Date   BREAST SURGERY     NASAL SINUS SURGERY     TOTAL HIP ARTHROPLASTY     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   Procedure: TOTAL KNEE ARTHROPLASTY;  Surgeon: Ollen Gross, MD;  Location: [facility];  Service: Orthopedics;  Laterality: Left;   TUBAL LIGATION     Patient Active Problem List   Diagnosis Date Noted   OA (osteoarthritis) of knee 07/18/2019   Primary osteoarthritis of left knee 07/18/2019    PCP: Ardyth Gal  REFERRING PROVIDER: Venita Lick  REFERRING DIAG:  M54.50 (ICD-10-CM) - Low back pain, unspecified      Rationale for Evaluation and Treatment: Rehabilitation  THERAPY DIAG:  Muscle weakness (generalized)  Abnormal posture  Pain in thoracic spine  Other low back pain  Localized edema  ONSET DATE: 05/08/22  SUBJECTIVE:                                                                                                                                                                                            SUBJECTIVE STATEMENT: "I feel all right", late. About 50% better, still some rough days   PERTINENT HISTORY:  Anxiety, arthritis   PAIN:  Are you having pain? Yes: NPRS scale: 6/10 Pain location: mid /upper back and in upper traps  Pain description: dull, constant Aggravating factors: lifting heavy objects Relieving factors: meloxicam, Tylenol  PRECAUTIONS: None 5 WEIGHT BEARING RESTRICTIONS: No  FALLS:  Has patient fallen in last 6 months? No  LIVING ENVIRONMENT: Lives with: lives alone Lives in: House/apartment Stairs: No Has following equipment at home: None  OCCUPATION: Retired  PLOF: Independent  PATIENT GOALS: to get rid of pain   OBJECTIVE:   DIAGNOSTIC FINDINGS:  X-rays demonstrate no change in the scoliosis    SCREENING FOR RED FLAGS: Bowel or bladder incontinence: No Spinal tumors: No Cauda equina syndrome: No Compression fracture: No Abdominal aneurysm: No  COGNITION: Overall cognitive status: Within functional limits for tasks assessed     SENSATION: WFL  MUSCLE LENGTH: Hamstrings: mild tightness BLE  POSTURE: rounded shoulders and forward head  PALPATION: Trigger points in bilateral traps and very tight.   LUMBAR ROM:   AROM Eval/ 07/17/22   Flexion Can touch toes, pain coming back up Can touch toes, low back stiffness  Extension 25% with pain WFL some low back stiffness  Right lateral flexion Fib head but pain Fib heas with pain  Left lateral flexion [facility]  WFL  Right rotation 50% 25%  Pain  Left rotation 50%  25% limited    (Blank rows = not tested)  LOWER EXTREMITY ROM:   grossly WFL except R hip flexion is limited to ~105d with pain    LOWER EXTREMITY MMT:    MMT Right eval Left eval Right  07/21/22 RT 08/19/22  Hip flexion 3+ pain in hip 5    Hip extension      Hip abduction 3+ pain 5 4+ 4+  Hip adduction      Hip internal  rotation      Hip external rotation      Knee flexion 5 5    Knee extension 3+ pain in hip 5 4 4+  Ankle dorsiflexion      Ankle plantarflexion      Ankle inversion      Ankle eversion       (Blank rows = not tested)   FUNCTIONAL TESTS:  5 times sit to stand: 23.40s   TODAY'S TREATMENT:                                                                                                                              DATE:  09/04/22 Nustep L 5 6 min HS curls 25lb 2x12 Leg Ext 5lb 2x10 Row & Lats 25lb 2x10 STM to lumbar and T-spine  Horiz abd green 2x10   08/28/22 Nustep L 5 6 min Standing yellow wt ball chest press, trunk ext and rotation 10 x each Row & Lats 25lb 2x10 Black tband trunk ext 20x Upright row into trunk ext 15# 2 sets 10 Cable pulley shld ext 2 sets 10 Cable pulley AR press 10 each DN uppertraps and Left rhomboid by MAlbright PT  08/26/22 UBE L 3 3 min each way NuStep L5 x 6 min HS curls 25lb 2x15 Leg Ext 5lb 2x15 Shoulder Ext 10lb 2x10  Row & Lats 25lb  2x10 Black tband trunk ext 20x S2S holding yellow ball 2x10    08/21/22 UBE 2 min each way HS curls 25lb 2x15 Leg Ext 5lb 2x15 Shoulder Ext 10lb 2x10  Row & Lats 25lb 2x10 S2S holding yellow ball 2x10 NuStep L5 x 6 min  08/19/22 UBE 2 min each way Rows and lats 25# 2x10  Wt ball OH ext 10 x the trunk rotation 10 x each way Shld ext with cable pulleys 2 sets 10 10# Upright row into ext 10# 2 sets 10 DN BIL UT by MAlbright PT STW to BIL UT MH to BIL UT sitting 10 min to help with DN soreness   PATIENT EDUCATION:  Education details: HEP and POC Person educated: Patient Education method: Explanation Education comprehension: verbalized understanding  HOME EXERCISE PROGRAM: Access Code: [email or web address] URL: [email or web address] Date: 06/10/2022 Prepared by: Cassie Freer  Exercises - Supine Lower Trunk Rotation  - 1 x daily - 7 x weekly - 2 sets - 10 reps - Supine Bridge  - 1 x daily -  7 x weekly - 2 sets - 10 reps - Supine Single Knee to Chest Stretch  - 2 x daily - 7 x weekly - 30 hold - Doorway Pec Stretch at 90 Degrees Abduction  - 2 x daily - 7 x weekly - 30 hold - Seated March with Resistance  - 1 x daily - 7 x weekly - 2 sets - 10 reps  ASSESSMENT:  CLINICAL IMPRESSION:  Pt ~ 5 minutes late for today's session. Tightness traps and TP left rhomboid, pt stated DN hurt a little moe last session. Postural ces needed throughout. Pt has forward head and shoulders at rest. No increase in pain during session.  . OBJECTIVE IMPAIRMENTS: difficulty walking, decreased ROM, decreased strength, and pain.   REHAB POTENTIAL: Good  CLINICAL DECISION MAKING: Stable/uncomplicated  EVALUATION COMPLEXITY: Low   GOALS: Goals reviewed with patient? Yes  SHORT TERM GOALS: Target date: 07/15/22  Patient will be independent with initial HEP.  Goal status: Met  2.   Patient will demonstrate improved 5xSTS < 15s. Baseline: 23.4s Goal status: Met 15 sec   LONG TERM GOALS: Target date: 08/19/22  Patient will be independent with advanced/ongoing HEP to improve outcomes and carryover.  Goal status: 07/31/22/ progressing  08/19/22 progressing   08/28/22 progressing  2.  Patient will report 75% improvement in low back pain to improve QOL.  Baseline: 7/10 Goal status: 6/10 progressing  40-50% better 07/31/22  60% 08/19/22  08/28/22 progressing  3.  Patient will demonstrate full pain free lumbar ROM to perform ADLs.   Goal status: Progressing 07/31/22  Progressing 08/19/22   08/28/22 progressing  4.  Patient will demonstrate improved functional strength as demonstrated by 5/5 in R hip. Baseline: 3+/5 Goal status: 07/21/22 Progressing see above   08/19/22 progressing  08/28/22 progressing   PLAN:  PT FREQUENCY: 2x/week  PT DURATION: 10 weeks  PLANNED INTERVENTIONS: Therapeutic exercises, Therapeutic activity, Neuromuscular re-education, Balance training, Gait training, Patient/Family  education, Self Care, Joint mobilization, Stair training, Dry Needling, Electrical stimulation, Cryotherapy, Moist heat, Traction, Ionotophoresis 4mg /ml Dexamethasone, and Manual therapy.  PLAN FOR NEXT SESSION: 20th visit complete ( no active FOTO) MD appt still awaiting to hear from MD    Grayce Sessions, PTA

## 2022-09-10 ENCOUNTER — Encounter: Payer: Self-pay | Admitting: Physical Therapy

## 2022-09-10 ENCOUNTER — Ambulatory Visit: Payer: 59 | Attending: Orthopedic Surgery | Admitting: Physical Therapy

## 2022-09-10 DIAGNOSIS — M546 Pain in thoracic spine: Secondary | ICD-10-CM | POA: Diagnosis present

## 2022-09-10 DIAGNOSIS — R293 Abnormal posture: Secondary | ICD-10-CM | POA: Insufficient documentation

## 2022-09-10 DIAGNOSIS — M6281 Muscle weakness (generalized): Secondary | ICD-10-CM | POA: Insufficient documentation

## 2022-09-10 DIAGNOSIS — M5459 Other low back pain: Secondary | ICD-10-CM | POA: Insufficient documentation

## 2022-09-10 DIAGNOSIS — R600 Localized edema: Secondary | ICD-10-CM | POA: Diagnosis present

## 2022-09-10 NOTE — Therapy (Signed)
[facility] at [facility]. [place], [place], [address] Phone: [phone]   Fax:  [phone]  Patient Details  Name: Vanessa Blair MRN: [id number] Date of Birth: 1950-10-15 OUTPATIENT PHYSICAL THERAPY THORACOLUMBAR TREATMENT Progress Note Reporting Period 07/24/22 to 08/28/22  See note below for Objective Data and Assessment of Progress/Goals.     Patient Name: Vanessa Blair MRN: [id number] DOB:1950-07-30, 72 y.o., female Today's Date: 09/10/2022  END OF SESSION:  PT End of Session - 09/10/22 1148     Visit Number 22    Date for PT Re-Evaluation 09/19/22    PT Start Time 1148    PT Stop Time 1230    PT Time Calculation (min) 42 min    Activity Tolerance Patient tolerated treatment well    Behavior During Therapy WFL for tasks assessed/performed              Past Medical History:  Diagnosis Date   Acid reflux    Anemia    Anxiety    Arthritis    Hiatal hernia    Hypercholesteremia    Hypertension    Past Surgical History:  Procedure Laterality Date   BREAST SURGERY     NASAL SINUS SURGERY     TOTAL HIP ARTHROPLASTY     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   Procedure: TOTAL KNEE ARTHROPLASTY;  Surgeon: Ollen Gross, MD;  Location: [facility];  Service: Orthopedics;  Laterality: Left;   TUBAL LIGATION     Patient Active Problem List   Diagnosis Date Noted   OA (osteoarthritis) of knee 07/18/2019   Primary osteoarthritis of left knee 07/18/2019    PCP: Ardyth Gal  REFERRING PROVIDER: Venita Lick  REFERRING DIAG:  M54.50 (ICD-10-CM) - Low back pain, unspecified      Rationale for Evaluation and Treatment: Rehabilitation  THERAPY DIAG:  Muscle weakness (generalized)  Abnormal posture  Pain in thoracic spine  Other low back pain  Localized edema  ONSET DATE: 05/08/22  SUBJECTIVE:                                                                                                                                                                                            SUBJECTIVE STATEMENT: Doing ok, just  a little stiff   PERTINENT HISTORY:  Anxiety, arthritis   PAIN:  Are you having pain? Yes: NPRS scale: 5/10 Pain location: mid /upper back and in upper traps  Pain description: dull, constant Aggravating factors: lifting heavy objects Relieving factors: meloxicam, Tylenol  PRECAUTIONS: None 5 WEIGHT BEARING RESTRICTIONS: No  FALLS:  Has patient fallen  in last 6 months? No  LIVING ENVIRONMENT: Lives with: lives alone Lives in: House/apartment Stairs: No Has following equipment at home: None  OCCUPATION: Retired  PLOF: Independent  PATIENT GOALS: to get rid of pain   OBJECTIVE:   DIAGNOSTIC FINDINGS:  X-rays demonstrate no change in the scoliosis    SCREENING FOR RED FLAGS: Bowel or bladder incontinence: No Spinal tumors: No Cauda equina syndrome: No Compression fracture: No Abdominal aneurysm: No  COGNITION: Overall cognitive status: Within functional limits for tasks assessed     SENSATION: WFL  MUSCLE LENGTH: Hamstrings: mild tightness BLE  POSTURE: rounded shoulders and forward head  PALPATION: Trigger points in bilateral traps and very tight.   LUMBAR ROM:   AROM Eval/ 07/17/22   Flexion Can touch toes, pain coming back up Can touch toes, low back stiffness  Extension 25% with pain WFL some low back stiffness  Right lateral flexion Fib head but pain Fib heas with pain  Left lateral flexion [facility]  WFL  Right rotation 50% 25%  Pain  Left rotation 50%  25% limited    (Blank rows = not tested)  LOWER EXTREMITY ROM:   grossly WFL except R hip flexion is limited to ~105d with pain    LOWER EXTREMITY MMT:    MMT Right eval Left eval Right  07/21/22 RT 08/19/22  Hip flexion 3+ pain in hip 5    Hip extension      Hip abduction 3+ pain 5 4+ 4+  Hip adduction      Hip internal rotation      Hip external rotation      Knee  flexion 5 5    Knee extension 3+ pain in hip 5 4 4+  Ankle dorsiflexion      Ankle plantarflexion      Ankle inversion      Ankle eversion       (Blank rows = not tested)   FUNCTIONAL TESTS:  5 times sit to stand: 23.40s   TODAY'S TREATMENT:                                                                                                                              DATE:  09/10/22 NuStep L5 x 6 min UBE L2 x 2 min each Row & Lats 25lb 2x12 HS curls 25lb 2x12 Leg Ext 5lb 2x10 Shoulder Ext 10lb 2x10 Horizontal abduction   09/04/22 Nustep L 5 6 min HS curls 25lb 2x12 Leg Ext 5lb 2x10 Row & Lats 25lb 2x10 STM to lumbar and T-spine  Horiz abd green 2x10   08/28/22 Nustep L 5 6 min Standing yellow wt ball chest press, trunk ext and rotation 10 x each Row & Lats 25lb 2x10 Black tband trunk ext 20x Upright row into trunk ext 15# 2 sets 10 Cable pulley shld ext 2 sets 10 Cable pulley AR press 10 each DN uppertraps and Left rhomboid by MAlbright PT  08/26/22 UBE  L 3 3 min each way NuStep L5 x 6 min HS curls 25lb 2x15 Leg Ext 5lb 2x15 Shoulder Ext 10lb 2x10  Row & Lats 25lb 2x10 Black tband trunk ext 20x S2S holding yellow ball 2x10    08/21/22 UBE 2 min each way HS curls 25lb 2x15 Leg Ext 5lb 2x15 Shoulder Ext 10lb 2x10  Row & Lats 25lb 2x10 S2S holding yellow ball 2x10 NuStep L5 x 6 min  08/19/22 UBE 2 min each way Rows and lats 25# 2x10  Wt ball OH ext 10 x the trunk rotation 10 x each way Shld ext with cable pulleys 2 sets 10 10# Upright row into ext 10# 2 sets 10 DN BIL UT by MAlbright PT STW to BIL UT MH to BIL UT sitting 10 min to help with DN soreness   PATIENT EDUCATION:  Education details: HEP and POC Person educated: Patient Education method: Explanation Education comprehension: verbalized understanding  HOME EXERCISE PROGRAM: Access Code: [email or web address] URL: [email or web address] Date: 06/10/2022 Prepared by: Cassie Freer  Exercises - Supine Lower Trunk Rotation  - 1 x daily - 7 x weekly - 2 sets - 10 reps - Supine Bridge  - 1 x daily - 7 x weekly - 2 sets - 10 reps - Supine Single Knee to Chest Stretch  - 2 x daily - 7 x weekly - 30 hold - Doorway Pec Stretch at 90 Degrees Abduction  - 2 x daily - 7 x weekly - 30 hold - Seated March with Resistance  - 1 x daily - 7 x weekly - 2 sets - 10 reps  ASSESSMENT:  CLINICAL IMPRESSION:  Tightness traps and TP left rhomboid. Improved mobility reported after aerobic warm up. Postural cues needed throughout. Pt has forward head and shoulders at rest. No increase in pain during session. Increase reps tolerated n machine level interventions  . OBJECTIVE IMPAIRMENTS: difficulty walking, decreased ROM, decreased strength, and pain.   REHAB POTENTIAL: Good  CLINICAL DECISION MAKING: Stable/uncomplicated  EVALUATION COMPLEXITY: Low   GOALS: Goals reviewed with patient? Yes  SHORT TERM GOALS: Target date: 07/15/22  Patient will be independent with initial HEP.  Goal status: Met  2.   Patient will demonstrate improved 5xSTS < 15s. Baseline: 23.4s Goal status: Met 15 sec   LONG TERM GOALS: Target date: 08/19/22  Patient will be independent with advanced/ongoing HEP to improve outcomes and carryover.  Goal status: 07/31/22/ progressing  08/19/22 progressing   08/28/22 progressing  2.  Patient will report 75% improvement in low back pain to improve QOL.  Baseline: 7/10 Goal status: 6/10 progressing  40-50% better 07/31/22  60% 08/19/22  08/28/22 progressing  3.  Patient will demonstrate full pain free lumbar ROM to perform ADLs.   Goal status: Progressing 07/31/22  Progressing 08/19/22   08/28/22 progressing  4.  Patient will demonstrate improved functional strength as demonstrated by 5/5 in R hip. Baseline: 3+/5 Goal status: 07/21/22 Progressing see above   08/19/22 progressing  08/28/22 progressing   PLAN:  PT FREQUENCY: 2x/week  PT DURATION: 10  weeks  PLANNED INTERVENTIONS: Therapeutic exercises, Therapeutic activity, Neuromuscular re-education, Balance training, Gait training, Patient/Family education, Self Care, Joint mobilization, Stair training, Dry Needling, Electrical stimulation, Cryotherapy, Moist heat, Traction, Ionotophoresis 4mg /ml Dexamethasone, and Manual therapy.  PLAN FOR NEXT SESSION: 20th visit complete ( no active FOTO) MD appt still awaiting to hear from MD    Grayce Sessions, PTA

## 2022-09-16 ENCOUNTER — Ambulatory Visit: Payer: 59 | Admitting: Physical Therapy

## 2022-09-16 DIAGNOSIS — M5459 Other low back pain: Secondary | ICD-10-CM

## 2022-09-16 DIAGNOSIS — M6281 Muscle weakness (generalized): Secondary | ICD-10-CM | POA: Diagnosis not present

## 2022-09-16 DIAGNOSIS — R293 Abnormal posture: Secondary | ICD-10-CM

## 2022-09-16 DIAGNOSIS — M546 Pain in thoracic spine: Secondary | ICD-10-CM

## 2022-09-16 NOTE — Therapy (Signed)
[facility] Outpatient Rehabilitation at [facility] [place], [place], [address] Phone: [phone]   Fax:  [phone]  Patient Details  Name: Vanessa Blair MRN: [id number] Date of Birth: 22-Jul-1950 OUTPATIENT PHYSICAL THERAPY THORACOLUMBAR TREATMENT   Patient Name: Vanessa Blair MRN: [id number] DOB:06/26/1950, 72 y.o., female Today's Date: 09/16/2022  END OF SESSION:  PT End of Session - 09/16/22 1235     Visit Number 23    Date for PT Re-Evaluation 09/19/22    PT Start Time 1235    PT Stop Time 1315    PT Time Calculation (min) 40 min              Past Medical History:  Diagnosis Date   Acid reflux    Anemia    Anxiety    Arthritis    Hiatal hernia    Hypercholesteremia    Hypertension    Past Surgical History:  Procedure Laterality Date   BREAST SURGERY     NASAL SINUS SURGERY     TOTAL HIP ARTHROPLASTY     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   Procedure: TOTAL KNEE ARTHROPLASTY;  Surgeon: Ollen Gross, MD;  Location: [facility];  Service: Orthopedics;  Laterality: Left;   TUBAL LIGATION     Patient Active Problem List   Diagnosis Date Noted   OA (osteoarthritis) of knee 07/18/2019   Primary osteoarthritis of left knee 07/18/2019    PCP: Ardyth Gal  REFERRING PROVIDER: Venita Lick  REFERRING DIAG:  M54.50 (ICD-10-CM) - Low back pain, unspecified      Rationale for Evaluation and Treatment: Rehabilitation  THERAPY DIAG:  Muscle weakness (generalized)  Abnormal posture  Pain in thoracic spine  Other low back pain  ONSET DATE: 05/08/22  SUBJECTIVE:                                                                                                                                                                                           SUBJECTIVE STATEMENT: Sinuses are really bothering me, did not sleep at all last night   PERTINENT HISTORY:  Anxiety, arthritis   PAIN:  Are you having pain? Yes: NPRS  scale: 5/10 Pain location: mid /upper back and in upper traps  Pain description: dull, constant Aggravating factors: lifting heavy objects Relieving factors: meloxicam, Tylenol  PRECAUTIONS: None 5 WEIGHT BEARING RESTRICTIONS: No  FALLS:  Has patient fallen in last 6 months? No  LIVING ENVIRONMENT: Lives with: lives alone Lives in: House/apartment Stairs: No Has following equipment at home: None  OCCUPATION: Retired  PLOF: Independent  PATIENT GOALS: to get rid of pain  OBJECTIVE:   DIAGNOSTIC FINDINGS:  X-rays demonstrate no change in the scoliosis    SCREENING FOR RED FLAGS: Bowel or bladder incontinence: No Spinal tumors: No Cauda equina syndrome: No Compression fracture: No Abdominal aneurysm: No  COGNITION: Overall cognitive status: Within functional limits for tasks assessed     SENSATION: WFL  MUSCLE LENGTH: Hamstrings: mild tightness BLE  POSTURE: rounded shoulders and forward head  PALPATION: Trigger points in bilateral traps and very tight.   LUMBAR ROM:   AROM Eval/ 07/17/22   Flexion Can touch toes, pain coming back up Can touch toes, low back stiffness  Extension 25% with pain WFL some low back stiffness  Right lateral flexion Fib head but pain Fib heas with pain  Left lateral flexion [facility]  WFL  Right rotation 50% 25%  Pain  Left rotation 50%  25% limited    (Blank rows = not tested)  LOWER EXTREMITY ROM:   grossly WFL except R hip flexion is limited to ~105d with pain    LOWER EXTREMITY MMT:    MMT Right eval Left eval Right  07/21/22 RT 08/19/22  Hip flexion 3+ pain in hip 5    Hip extension      Hip abduction 3+ pain 5 4+ 4+  Hip adduction      Hip internal rotation      Hip external rotation      Knee flexion 5 5    Knee extension 3+ pain in hip 5 4 4+  Ankle dorsiflexion      Ankle plantarflexion      Ankle inversion      Ankle eversion       (Blank rows = not tested)   FUNCTIONAL TESTS:  5 times sit to stand:  23.40s   TODAY'S TREATMENT:                                                                                                                              DATE:   09/16/22 UBE L 3 31m in fwd/2 min back Row & Lats 25lb 2x12 Black tband trunk flex and ext 20 x HS curls 25lb 2x12 Leg Ext 5lb 2x10 STS with wt ball press Shoulder Ext green tband 2x10 Horizontal abduction     09/10/22 NuStep L5 x 6 min UBE L2 x 2 min each Row & Lats 25lb 2x12 HS curls 25lb 2x12 Leg Ext 5lb 2x10 Shoulder Ext 10lb 2x10 Horizontal abduction   09/04/22 Nustep L 5 6 min HS curls 25lb 2x12 Leg Ext 5lb 2x10 Row & Lats 25lb 2x10 STM to lumbar and T-spine  Horiz abd green 2x10   08/28/22 Nustep L 5 6 min Standing yellow wt ball chest press, trunk ext and rotation 10 x each Row & Lats 25lb 2x10 Black tband trunk ext 20x Upright row into trunk ext 15# 2 sets 10 Cable pulley shld ext 2 sets 10 Cable pulley AR press 10 each DN uppertraps and  Left rhomboid by MAlbright PT  08/26/22 UBE L 3 3 min each way NuStep L5 x 6 min HS curls 25lb 2x15 Leg Ext 5lb 2x15 Shoulder Ext 10lb 2x10  Row & Lats 25lb 2x10 Black tband trunk ext 20x S2S holding yellow ball 2x10    08/21/22 UBE 2 min each way HS curls 25lb 2x15 Leg Ext 5lb 2x15 Shoulder Ext 10lb 2x10  Row & Lats 25lb 2x10 S2S holding yellow ball 2x10 NuStep L5 x 6 min  08/19/22 UBE 2 min each way Rows and lats 25# 2x10  Wt ball OH ext 10 x the trunk rotation 10 x each way Shld ext with cable pulleys 2 sets 10 10# Upright row into ext 10# 2 sets 10 DN BIL UT by MAlbright PT STW to BIL UT MH to BIL UT sitting 10 min to help with DN soreness   PATIENT EDUCATION:  Education details: HEP and POC Person educated: Patient Education method: Explanation Education comprehension: verbalized understanding  HOME EXERCISE PROGRAM: Access Code: [email or web address] URL: [email or web address] Date: 06/10/2022 Prepared by: Cassie Freer  Exercises - Supine Lower Trunk Rotation  - 1 x daily - 7 x weekly - 2 sets - 10 reps - Supine Bridge  - 1 x daily - 7 x weekly - 2 sets - 10 reps - Supine Single Knee to Chest Stretch  - 2 x daily - 7 x weekly - 30 hold - Doorway Pec Stretch at 90 Degrees Abduction  - 2 x daily - 7 x weekly - 30 hold - Seated March with Resistance  - 1 x daily - 7 x weekly - 2 sets - 10 reps  ASSESSMENT:  CLINICAL IMPRESSION:  Tightness traps and TP left rhomboid.  Postural cues needed throughout. Pt has forward head and shoulders at rest. No increase in pain during session. Increase reps tolerated n machine level interventions  . OBJECTIVE IMPAIRMENTS: difficulty walking, decreased ROM, decreased strength, and pain.   REHAB POTENTIAL: Good  CLINICAL DECISION MAKING: Stable/uncomplicated  EVALUATION COMPLEXITY: Low   GOALS: Goals reviewed with patient? Yes  SHORT TERM GOALS: Target date: 07/15/22  Patient will be independent with initial HEP.  Goal status: Met  2.   Patient will demonstrate improved 5xSTS < 15s. Baseline: 23.4s Goal status: Met 15 sec   LONG TERM GOALS: Target date: 08/19/22  Patient will be independent with advanced/ongoing HEP to improve outcomes and carryover.  Goal status: 07/31/22/ progressing  08/19/22 progressing   08/28/22 progressing  2.  Patient will report 75% improvement in low back pain to improve QOL.  Baseline: 7/10 Goal status: 6/10 progressing  40-50% better 07/31/22  60% 08/19/22  08/28/22 progressing  3.  Patient will demonstrate full pain free lumbar ROM to perform ADLs.   Goal status: Progressing 07/31/22  Progressing 08/19/22   08/28/22 progressing  4.  Patient will demonstrate improved functional strength as demonstrated by 5/5 in R hip. Baseline: 3+/5 Goal status: 07/21/22 Progressing see above   08/19/22 progressing  08/28/22 progressing   PLAN:  PT FREQUENCY: 2x/week  PT DURATION: 10 weeks  PLANNED INTERVENTIONS: Therapeutic exercises,  Therapeutic activity, Neuromuscular re-education, Balance training, Gait training, Patient/Family education, Self Care, Joint mobilization, Stair training, Dry Needling, Electrical stimulation, Cryotherapy, Moist heat, Traction, Ionotophoresis 4mg /ml Dexamethasone, and Manual therapy.  PLAN FOR NEXT SESSION: ASSESSMENT DUE next session,including goals   Suanne Marker, [facility]one [facility] at [facility] [place], [place], [address] Phone: [phone]   Fax:  [phone]  Patient Details  Name: CHARLES SHARER MRN: [id number] Date of Birth: 26-Oct-1950 Referring Provider:  Ardyth Gal, MD  Encounter Date: 09/16/2022   Suanne Marker, PTA 09/16/2022, 12:36 PM  [place] [facility] at [facility] [address] [place], [place], [address] Phone: [phone]   Fax:  [phone]

## 2022-09-18 ENCOUNTER — Ambulatory Visit: Payer: 59

## 2022-09-23 ENCOUNTER — Encounter: Payer: Self-pay | Admitting: Physical Therapy

## 2022-09-23 ENCOUNTER — Ambulatory Visit: Payer: 59 | Admitting: Physical Therapy

## 2022-09-23 DIAGNOSIS — M5459 Other low back pain: Secondary | ICD-10-CM

## 2022-09-23 DIAGNOSIS — R293 Abnormal posture: Secondary | ICD-10-CM

## 2022-09-23 DIAGNOSIS — M546 Pain in thoracic spine: Secondary | ICD-10-CM

## 2022-09-23 DIAGNOSIS — M6281 Muscle weakness (generalized): Secondary | ICD-10-CM | POA: Diagnosis not present

## 2022-09-23 DIAGNOSIS — R600 Localized edema: Secondary | ICD-10-CM

## 2022-09-23 NOTE — Therapy (Signed)
[place] [facility] at [facility]. [place], [place], [address] Phone: [phone]   Fax:  [phone]  Patient Details  Name: Vanessa Blair MRN: [id number] Date of Birth: 24-Feb-1951 OUTPATIENT PHYSICAL THERAPY THORACOLUMBAR TREATMENT   Patient Name: Vanessa Blair MRN: [id number] DOB:1950/05/10, 72 y.o., female Today's Date: 09/23/2022  END OF SESSION:  PT End of Session - 09/23/22 1104     Visit Number 24    Date for PT Re-Evaluation 10/23/22    PT Start Time 1104    PT Stop Time 1145    PT Time Calculation (min) 41 min    Activity Tolerance Patient tolerated treatment well    Behavior During Therapy WFL for tasks assessed/performed              Past Medical History:  Diagnosis Date   Acid reflux    Anemia    Anxiety    Arthritis    Hiatal hernia    Hypercholesteremia    Hypertension    Past Surgical History:  Procedure Laterality Date   BREAST SURGERY     NASAL SINUS SURGERY     TOTAL HIP ARTHROPLASTY     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   Procedure: TOTAL KNEE ARTHROPLASTY;  Surgeon: Ollen Gross, MD;  Location: [facility];  Service: Orthopedics;  Laterality: Left;   TUBAL LIGATION     Patient Active Problem List   Diagnosis Date Noted   OA (osteoarthritis) of knee 07/18/2019   Primary osteoarthritis of left knee 07/18/2019    PCP: Ardyth Gal  REFERRING PROVIDER: Venita Lick  REFERRING DIAG:  M54.50 (ICD-10-CM) - Low back pain, unspecified      Rationale for Evaluation and Treatment: Rehabilitation  THERAPY DIAG:  Muscle weakness (generalized)  Abnormal posture  Other low back pain  Pain in thoracic spine  Localized edema  ONSET DATE: 05/08/22  SUBJECTIVE:                                                                                                                                                                                           SUBJECTIVE STATEMENT: Pain an tightness  remains in the shoulders, pt reports a difficult time vacuuming and carrying groceries     PERTINENT HISTORY:  Anxiety, arthritis   PAIN:  Are you having pain? Yes: NPRS scale: 7/10 Pain location: mid /upper back and in upper traps  Pain description: dull, constant Aggravating factors: lifting heavy objects Relieving factors: meloxicam, Tylenol  PRECAUTIONS: None 5 WEIGHT BEARING RESTRICTIONS: No  FALLS:  Has patient fallen in last 6 months? No  LIVING ENVIRONMENT: Lives  with: lives alone Lives in: House/apartment Stairs: No Has following equipment at home: None  OCCUPATION: Retired  PLOF: Independent  PATIENT GOALS: to get rid of pain   OBJECTIVE:   DIAGNOSTIC FINDINGS:  X-rays demonstrate no change in the scoliosis    SCREENING FOR RED FLAGS: Bowel or bladder incontinence: No Spinal tumors: No Cauda equina syndrome: No Compression fracture: No Abdominal aneurysm: No  COGNITION: Overall cognitive status: Within functional limits for tasks assessed     SENSATION: WFL  MUSCLE LENGTH: Hamstrings: mild tightness BLE  POSTURE: rounded shoulders and forward head  PALPATION: Trigger points in bilateral traps and very tight.   LUMBAR ROM:   AROM Eval/ 07/17/22  09/23/22  Flexion Can touch toes, pain coming back up Can touch toes, low back stiffness Can touch toes WFL  Extension 25% with pain WFL some low back stiffness WFL some low back tightness  Right lateral flexion Fib head but pain Fib heas with pain WFL   Left lateral flexion [facility] WFL reports a pull in the low back  Right rotation 50% 25%  Pain WFL  Left rotation 50%  25% limited  WFL   (Blank rows = not tested)  LOWER EXTREMITY ROM:   grossly WFL except R hip flexion is limited to ~105d with pain    LOWER EXTREMITY MMT:    MMT Right eval Left eval Right  07/21/22 RT 08/19/22 RT  Hip flexion 3+ pain in hip 5     Hip extension       Hip abduction 3+ pain 5 4+ 4+ 5/5  Hip adduction      4+  Hip internal rotation       Hip external rotation       Knee flexion 5 5     Knee extension 3+ pain in hip 5 4 4+ 4+  Ankle dorsiflexion       Ankle plantarflexion       Ankle inversion       Ankle eversion        (Blank rows = not tested)   FUNCTIONAL TESTS:  5 times sit to stand: 23.40s   TODAY'S TREATMENT:                                                                                                                              DATE:  09/23/22 UBE L 3 44m in fwd/2 min back HS curls 25lb 2x12 Leg Ext 10lb 2x12 Row & Lats 25lb 2x12  STM to upper traps and lumbar spine  Shoulder ER red 2x10 Seated OHP yellow wball    09/16/22 UBE L 3 75m in fwd/2 min back Row & Lats 25lb 2x12 Black tband trunk flex and ext 20 x HS curls 25lb 2x12 Leg Ext 5lb 2x10 STS with wt ball press Shoulder Ext green tband 2x10 Horizontal abduction     09/10/22 NuStep L5 x 6 min UBE L2 x 2 min each  Row & Lats 25lb 2x12 HS curls 25lb 2x12 Leg Ext 5lb 2x10 Shoulder Ext 10lb 2x10 Horizontal abduction   09/04/22 Nustep L 5 6 min HS curls 25lb 2x12 Leg Ext 5lb 2x10 Row & Lats 25lb 2x10 STM to lumbar and T-spine  Horiz abd green 2x10   08/28/22 Nustep L 5 6 min Standing yellow wt ball chest press, trunk ext and rotation 10 x each Row & Lats 25lb 2x10 Black tband trunk ext 20x Upright row into trunk ext 15# 2 sets 10 Cable pulley shld ext 2 sets 10 Cable pulley AR press 10 each DN uppertraps and Left rhomboid by MAlbright PT  08/26/22 UBE L 3 3 min each way NuStep L5 x 6 min HS curls 25lb 2x15 Leg Ext 5lb 2x15 Shoulder Ext 10lb 2x10  Row & Lats 25lb 2x10 Black tband trunk ext 20x S2S holding yellow ball 2x10   PATIENT EDUCATION:  Education details: HEP and POC Person educated: Patient Education method: Explanation Education comprehension: verbalized understanding  HOME EXERCISE PROGRAM: Access Code: [email or web address] URL: [email or web address] Date:  06/10/2022 Prepared by: Cassie Freer  Exercises - Supine Lower Trunk Rotation  - 1 x daily - 7 x weekly - 2 sets - 10 reps - Supine Bridge  - 1 x daily - 7 x weekly - 2 sets - 10 reps - Supine Single Knee to Chest Stretch  - 2 x daily - 7 x weekly - 30 hold - Doorway Pec Stretch at 90 Degrees Abduction  - 2 x daily - 7 x weekly - 30 hold - Seated March with Resistance  - 1 x daily - 7 x weekly - 2 sets - 10 reps  ASSESSMENT:  CLINICAL IMPRESSION:  Tightness traps and TP left rhomboid remains Pt reports difficulty with vacuuming and carrying groceries.   Postural cues needed throughout. Pt has forward head and shoulders at rest. No increase in pain during session. Good lumbar ROM but reports pain, pulling , and tightness with mobility. . OBJECTIVE IMPAIRMENTS: difficulty walking, decreased ROM, decreased strength, and pain.   REHAB POTENTIAL: Good  CLINICAL DECISION MAKING: Stable/uncomplicated  EVALUATION COMPLEXITY: Low   GOALS: Goals reviewed with patient? Yes  SHORT TERM GOALS: Target date: 07/15/22  Patient will be independent with initial HEP.  Goal status: Met  2.   Patient will demonstrate improved 5xSTS < 15s. Baseline: 23.4s Goal status: Met 15 sec   LONG TERM GOALS: Target date: 08/19/22  Patient will be independent with advanced/ongoing HEP to improve outcomes and carryover.  Goal status: 07/31/22/ progressing  08/19/22 progressing   08/28/22 progressing  2.  Patient will report 75% improvement in low back pain to improve QOL.  Baseline: 7/10 Goal status 08/28/22 progressing,  Progressing 09/23/22 60%  3.  Patient will demonstrate full pain free lumbar ROM to perform ADLs.   Goal status: Progressing 07/31/22  Progressing 08/19/22   08/28/22 progressing, Progressing 09/23/22 Full ROM but som pain and tightness  4.  Patient will demonstrate improved functional strength as demonstrated by 5/5 in R hip. Baseline: 3+/5 Goal status: 07/21/22 Progressing see above   08/19/22  progressing  08/28/22 progressing, Progressing    PLAN:  PT FREQUENCY: 2x/week  PT DURATION: 10 weeks  PLANNED INTERVENTIONS: Therapeutic exercises, Therapeutic activity, Neuromuscular re-education, Balance training, Gait training, Patient/Family education, Self Care, Joint mobilization, Stair training, Dry Needling, Electrical stimulation, Cryotherapy, Moist heat, Traction, Ionotophoresis 4mg /ml Dexamethasone, and Manual therapy.  PLAN FOR NEXT SESSION: ASSESSMENT DUE next session,including goals  Jearld Lesch, PT 09/23/2022, 12:37 PM

## 2022-09-24 NOTE — Therapy (Signed)
[place] [facility] at [facility]. [place], [place], [address] Phone: [phone]   Fax:  [phone]  Patient Details  Name: Vanessa Blair MRN: [id number] Date of Birth: Aug 20, 1950 OUTPATIENT PHYSICAL THERAPY THORACOLUMBAR TREATMENT   Patient Name: Vanessa Blair MRN: [id number] DOB:12/01/1950, 72 y.o., female Today's Date: 09/25/2022  END OF SESSION:  PT End of Session - 09/25/22 1147     Visit Number 25    Date for PT Re-Evaluation 10/23/22    PT Start Time 1145    PT Stop Time 1230    PT Time Calculation (min) 45 min    Activity Tolerance Patient tolerated treatment well    Behavior During Therapy WFL for tasks assessed/performed               Past Medical History:  Diagnosis Date   Acid reflux    Anemia    Anxiety    Arthritis    Hiatal hernia    Hypercholesteremia    Hypertension    Past Surgical History:  Procedure Laterality Date   BREAST SURGERY     NASAL SINUS SURGERY     TOTAL HIP ARTHROPLASTY     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   Procedure: TOTAL KNEE ARTHROPLASTY;  Surgeon: Ollen Gross, MD;  Location: [facility];  Service: Orthopedics;  Laterality: Left;   TUBAL LIGATION     Patient Active Problem List   Diagnosis Date Noted   OA (osteoarthritis) of knee 07/18/2019   Primary osteoarthritis of left knee 07/18/2019    PCP: Ardyth Gal  REFERRING PROVIDER: Venita Lick  REFERRING DIAG:  M54.50 (ICD-10-CM) - Low back pain, unspecified      Rationale for Evaluation and Treatment: Rehabilitation  THERAPY DIAG:  Muscle weakness (generalized)  Abnormal posture  Other low back pain  Pain in thoracic spine  ONSET DATE: 05/08/22  SUBJECTIVE:                                                                                                                                                                                           SUBJECTIVE STATEMENT: Pain an tightness remains in the  shoulders, pt reports a difficult time vacuuming and carrying groceries     PERTINENT HISTORY:  Anxiety, arthritis   PAIN:  Are you having pain? Yes: NPRS scale: 7/10 Pain location: mid /upper back and in upper traps  Pain description: dull, constant Aggravating factors: lifting heavy objects Relieving factors: meloxicam, Tylenol  PRECAUTIONS: None 5 WEIGHT BEARING RESTRICTIONS: No  FALLS:  Has patient fallen in last 6 months? No  LIVING ENVIRONMENT: Lives with: lives  alone Lives in: House/apartment Stairs: No Has following equipment at home: None  OCCUPATION: Retired  PLOF: Independent  PATIENT GOALS: to get rid of pain   OBJECTIVE:   DIAGNOSTIC FINDINGS:  X-rays demonstrate no change in the scoliosis    SCREENING FOR RED FLAGS: Bowel or bladder incontinence: No Spinal tumors: No Cauda equina syndrome: No Compression fracture: No Abdominal aneurysm: No  COGNITION: Overall cognitive status: Within functional limits for tasks assessed     SENSATION: WFL  MUSCLE LENGTH: Hamstrings: mild tightness BLE  POSTURE: rounded shoulders and forward head  PALPATION: Trigger points in bilateral traps and very tight.   LUMBAR ROM:   AROM Eval/ 07/17/22  09/23/22  Flexion Can touch toes, pain coming back up Can touch toes, low back stiffness Can touch toes WFL  Extension 25% with pain WFL some low back stiffness WFL some low back tightness  Right lateral flexion Fib head but pain Fib heas with pain WFL   Left lateral flexion [facility] WFL reports a pull in the low back  Right rotation 50% 25%  Pain WFL  Left rotation 50%  25% limited  WFL   (Blank rows = not tested)  LOWER EXTREMITY ROM:   grossly WFL except R hip flexion is limited to ~105d with pain    LOWER EXTREMITY MMT:    MMT Right eval Left eval Right  07/21/22 RT 08/19/22 RT  Hip flexion 3+ pain in hip 5     Hip extension       Hip abduction 3+ pain 5 4+ 4+ 5/5  Hip adduction     4+  Hip  internal rotation       Hip external rotation       Knee flexion 5 5     Knee extension 3+ pain in hip 5 4 4+ 4+  Ankle dorsiflexion       Ankle plantarflexion       Ankle inversion       Ankle eversion        (Blank rows = not tested)   FUNCTIONAL TESTS:  5 times sit to stand: 23.40s   TODAY'S TREATMENT:                                                                                                                              DATE:  09/25/22 DN to Upper traps  NuStep L5 x1mins  AR press 20# 2x10  Blue TB ext 2x10 Rows and Lats 25# 2x10 HS curls 25# 2x10  Leg ext 10# 2x10 MH to upper traps 10 mins   09/23/22 UBE L 3 41m in fwd/2 min back HS curls 25lb 2x12 Leg Ext 10lb 2x12 Row & Lats 25lb 2x12  STM to upper traps and lumbar spine  Shoulder ER red 2x10 Seated OHP yellow wball    09/16/22 UBE L 3 74m in fwd/2 min back Row & Lats 25lb 2x12 Black tband trunk flex and  ext 20 x HS curls 25lb 2x12 Leg Ext 5lb 2x10 STS with wt ball press Shoulder Ext green tband 2x10 Horizontal abduction     09/10/22 NuStep L5 x 6 min UBE L2 x 2 min each Row & Lats 25lb 2x12 HS curls 25lb 2x12 Leg Ext 5lb 2x10 Shoulder Ext 10lb 2x10 Horizontal abduction   09/04/22 Nustep L 5 6 min HS curls 25lb 2x12 Leg Ext 5lb 2x10 Row & Lats 25lb 2x10 STM to lumbar and T-spine  Horiz abd green 2x10   08/28/22 Nustep L 5 6 min Standing yellow wt ball chest press, trunk ext and rotation 10 x each Row & Lats 25lb 2x10 Black tband trunk ext 20x Upright row into trunk ext 15# 2 sets 10 Cable pulley shld ext 2 sets 10 Cable pulley AR press 10 each DN uppertraps and Left rhomboid by MAlbright PT  08/26/22 UBE L 3 3 min each way NuStep L5 x 6 min HS curls 25lb 2x15 Leg Ext 5lb 2x15 Shoulder Ext 10lb 2x10  Row & Lats 25lb 2x10 Black tband trunk ext 20x S2S holding yellow ball 2x10   PATIENT EDUCATION:  Education details: HEP and POC Person educated: Patient Education method:  Explanation Education comprehension: verbalized understanding  HOME EXERCISE PROGRAM: Access Code: [email or web address] URL: [email or web address] Date: 06/10/2022 Prepared by: Cassie Freer  Exercises - Supine Lower Trunk Rotation  - 1 x daily - 7 x weekly - 2 sets - 10 reps - Supine Bridge  - 1 x daily - 7 x weekly - 2 sets - 10 reps - Supine Single Knee to Chest Stretch  - 2 x daily - 7 x weekly - 30 hold - Doorway Pec Stretch at 90 Degrees Abduction  - 2 x daily - 7 x weekly - 30 hold - Seated March with Resistance  - 1 x daily - 7 x weekly - 2 sets - 10 reps  ASSESSMENT:  CLINICAL IMPRESSION:  Tightness traps and Tps remains, she requests more DN today. Started with the needling and continues with some postural exercises to keep her moving to avoid possible soreness afterwards. Postural cues needed throughout. Pt has forward head and shoulders at rest.   OBJECTIVE IMPAIRMENTS: difficulty walking, decreased ROM, decreased strength, and pain.   REHAB POTENTIAL: Good  CLINICAL DECISION MAKING: Stable/uncomplicated  EVALUATION COMPLEXITY: Low   GOALS: Goals reviewed with patient? Yes  SHORT TERM GOALS: Target date: 07/15/22  Patient will be independent with initial HEP.  Goal status: Met  2.   Patient will demonstrate improved 5xSTS < 15s. Baseline: 23.4s Goal status: Met 15 sec   LONG TERM GOALS: Target date: 08/19/22  Patient will be independent with advanced/ongoing HEP to improve outcomes and carryover.  Goal status: 07/31/22/ progressing  08/19/22 progressing   08/28/22 progressing  2.  Patient will report 75% improvement in low back pain to improve QOL.  Baseline: 7/10 Goal status 08/28/22 progressing,  Progressing 09/23/22 60%  3.  Patient will demonstrate full pain free lumbar ROM to perform ADLs.   Goal status: Progressing 07/31/22  Progressing 08/19/22   08/28/22 progressing, Progressing 09/23/22 Full ROM but som pain and tightness  4.  Patient will demonstrate  improved functional strength as demonstrated by 5/5 in R hip. Baseline: 3+/5 Goal status: 07/21/22 Progressing see above   08/19/22 progressing  08/28/22 progressing, Progressing    PLAN:  PT FREQUENCY: 2x/week  PT DURATION: 10 weeks  PLANNED INTERVENTIONS: Therapeutic exercises, Therapeutic activity, Neuromuscular re-education, Balance training, Gait training, Patient/Family  education, Self Care, Joint mobilization, Stair training, Dry Needling, Electrical stimulation, Cryotherapy, Moist heat, Traction, Ionotophoresis 4mg /ml Dexamethasone, and Manual therapy.  PLAN FOR NEXT SESSION: ASSESSMENT DUE next session,including goals    Cassie Freer, PT 09/25/2022, 12:34 PM

## 2022-09-25 ENCOUNTER — Ambulatory Visit: Payer: 59

## 2022-09-25 DIAGNOSIS — M546 Pain in thoracic spine: Secondary | ICD-10-CM

## 2022-09-25 DIAGNOSIS — M6281 Muscle weakness (generalized): Secondary | ICD-10-CM | POA: Diagnosis not present

## 2022-09-25 DIAGNOSIS — R293 Abnormal posture: Secondary | ICD-10-CM

## 2022-09-25 DIAGNOSIS — M5459 Other low back pain: Secondary | ICD-10-CM

## 2022-09-30 ENCOUNTER — Ambulatory Visit: Payer: 59 | Admitting: Physical Therapy

## 2022-09-30 ENCOUNTER — Encounter: Payer: Self-pay | Admitting: Physical Therapy

## 2022-09-30 DIAGNOSIS — M546 Pain in thoracic spine: Secondary | ICD-10-CM

## 2022-09-30 DIAGNOSIS — R293 Abnormal posture: Secondary | ICD-10-CM

## 2022-09-30 DIAGNOSIS — M5459 Other low back pain: Secondary | ICD-10-CM

## 2022-09-30 DIAGNOSIS — M6281 Muscle weakness (generalized): Secondary | ICD-10-CM | POA: Diagnosis not present

## 2022-09-30 NOTE — Therapy (Signed)
OUTPATIENT PHYSICAL THERAPY THORACOLUMBAR TREATMENT   Patient Name: Vanessa Blair MRN: [id number] DOB:02-19-1951, 72 y.o., female Today's Date: 09/30/2022  END OF SESSION:  PT End of Session - 09/30/22 1155     Visit Number 26    Date for PT Re-Evaluation 10/23/22    PT Start Time 1155    PT Stop Time 1230    PT Time Calculation (min) 35 min    Activity Tolerance Patient tolerated treatment well    Behavior During Therapy WFL for tasks assessed/performed               Past Medical History:  Diagnosis Date   Acid reflux    Anemia    Anxiety    Arthritis    Hiatal hernia    Hypercholesteremia    Hypertension    Past Surgical History:  Procedure Laterality Date   BREAST SURGERY     NASAL SINUS SURGERY     TOTAL HIP ARTHROPLASTY     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   Procedure: TOTAL KNEE ARTHROPLASTY;  Surgeon: Ollen Gross, MD;  Location: [facility];  Service: Orthopedics;  Laterality: Left;   TUBAL LIGATION     Patient Active Problem List   Diagnosis Date Noted   OA (osteoarthritis) of knee 07/18/2019   Primary osteoarthritis of left knee 07/18/2019    PCP: Ardyth Gal  REFERRING PROVIDER: Venita Lick  REFERRING DIAG:  M54.50 (ICD-10-CM) - Low back pain, unspecified      Rationale for Evaluation and Treatment: Rehabilitation  THERAPY DIAG:  Muscle weakness (generalized)  Abnormal posture  Other low back pain  Pain in thoracic spine  ONSET DATE: 05/08/22  SUBJECTIVE:                                                                                                                                                                                           SUBJECTIVE STATEMENT: My lower back and near the L shoulder blade   PERTINENT HISTORY:  Anxiety, arthritis   PAIN:  Are you having pain? Yes: NPRS scale: 6/10 Pain location: mid /upper back and in upper traps  Pain description: dull, constant Aggravating factors: lifting heavy  objects Relieving factors: meloxicam, Tylenol  PRECAUTIONS: None 5 WEIGHT BEARING RESTRICTIONS: No  FALLS:  Has patient fallen in last 6 months? No  LIVING ENVIRONMENT: Lives with: lives alone Lives in: House/apartment Stairs: No Has following equipment at home: None  OCCUPATION: Retired  PLOF: Independent  PATIENT GOALS: to get rid of pain   OBJECTIVE:   DIAGNOSTIC FINDINGS:  X-rays demonstrate no change in the scoliosis    SCREENING FOR RED  FLAGS: Bowel or bladder incontinence: No Spinal tumors: No Cauda equina syndrome: No Compression fracture: No Abdominal aneurysm: No  COGNITION: Overall cognitive status: Within functional limits for tasks assessed     SENSATION: WFL  MUSCLE LENGTH: Hamstrings: mild tightness BLE  POSTURE: rounded shoulders and forward head  PALPATION: Trigger points in bilateral traps and very tight.   LUMBAR ROM:   AROM Eval/ 07/17/22  09/23/22  Flexion Can touch toes, pain coming back up Can touch toes, low back stiffness Can touch toes WFL  Extension 25% with pain WFL some low back stiffness WFL some low back tightness  Right lateral flexion Fib head but pain Fib heas with pain WFL   Left lateral flexion [facility]  [facility] WFL reports a pull in the low back  Right rotation 50% 25%  Pain WFL  Left rotation 50%  25% limited  WFL   (Blank rows = not tested)  LOWER EXTREMITY ROM:   grossly WFL except R hip flexion is limited to ~105d with pain    LOWER EXTREMITY MMT:    MMT Right eval Left eval Right  07/21/22 RT 08/19/22 RT  Hip flexion 3+ pain in hip 5     Hip extension       Hip abduction 3+ pain 5 4+ 4+ 5/5  Hip adduction     4+  Hip internal rotation       Hip external rotation       Knee flexion 5 5     Knee extension 3+ pain in hip 5 4 4+ 4+  Ankle dorsiflexion       Ankle plantarflexion       Ankle inversion       Ankle eversion        (Blank rows = not tested)   FUNCTIONAL TESTS:  5 times sit to stand:  23.40s   TODAY'S TREATMENT:                                                                                                                              DATE:  09/30/22 NuStep L5 x2mins  UBE L2 x 2 min each Shoulder Ext blue 2x12  Row & Lats 25lb 2x12  STM to upper traps and lumbar spine  HS curls 25lb 2x12 Leg Ext 10lb 2x12   09/25/22 DN to Upper traps  NuStep L5 x55mins  AR press 20# 2x10  Blue TB ext 2x10 Rows and Lats 25# 2x10 HS curls 25# 2x10  Leg ext 10# 2x10 MH to upper traps 10 mins   09/23/22 UBE L 3 44m in fwd/2 min back HS curls 25lb 2x12 Leg Ext 10lb 2x12 Row & Lats 25lb 2x12  STM to upper traps and lumbar spine  Shoulder ER red 2x10 Seated OHP yellow wball    09/16/22 UBE L 3 50m in fwd/2 min back Row & Lats 25lb 2x12 Black tband trunk flex and ext 20 x HS curls 25lb 2x12  Leg Ext 5lb 2x10 STS with wt ball press Shoulder Ext green tband 2x10 Horizontal abduction     09/10/22 NuStep L5 x 6 min UBE L2 x 2 min each Row & Lats 25lb 2x12 HS curls 25lb 2x12 Leg Ext 5lb 2x10 Shoulder Ext 10lb 2x10 Horizontal abduction   09/04/22 Nustep L 5 6 min HS curls 25lb 2x12 Leg Ext 5lb 2x10 Row & Lats 25lb 2x10 STM to lumbar and T-spine  Horiz abd green 2x10   08/28/22 Nustep L 5 6 min Standing yellow wt ball chest press, trunk ext and rotation 10 x each Row & Lats 25lb 2x10 Black tband trunk ext 20x Upright row into trunk ext 15# 2 sets 10 Cable pulley shld ext 2 sets 10 Cable pulley AR press 10 each DN uppertraps and Left rhomboid by MAlbright PT  08/26/22 UBE L 3 3 min each way NuStep L5 x 6 min HS curls 25lb 2x15 Leg Ext 5lb 2x15 Shoulder Ext 10lb 2x10  Row & Lats 25lb 2x10 Black tband trunk ext 20x S2S holding yellow ball 2x10   PATIENT EDUCATION:  Education details: HEP and POC Person educated: Patient Education method: Explanation Education comprehension: verbalized understanding  HOME EXERCISE PROGRAM: Access Code: [email or web address] URL:  [email or web address] Date: 06/10/2022 Prepared by: Cassie Freer  Exercises - Supine Lower Trunk Rotation  - 1 x daily - 7 x weekly - 2 sets - 10 reps - Supine Bridge  - 1 x daily - 7 x weekly - 2 sets - 10 reps - Supine Single Knee to Chest Stretch  - 2 x daily - 7 x weekly - 30 hold - Doorway Pec Stretch at 90 Degrees Abduction  - 2 x daily - 7 x weekly - 30 hold - Seated March with Resistance  - 1 x daily - 7 x weekly - 2 sets - 10 reps  ASSESSMENT:  CLINICAL IMPRESSION:  Tightness traps and Tps remains Continued with some postural exercises to reinforce core strength and awareness. Postural cues needed throughout. Pt has forward head and shoulders at rest.   OBJECTIVE IMPAIRMENTS: difficulty walking, decreased ROM, decreased strength, and pain.   REHAB POTENTIAL: Good  CLINICAL DECISION MAKING: Stable/uncomplicated  EVALUATION COMPLEXITY: Low   GOALS: Goals reviewed with patient? Yes  SHORT TERM GOALS: Target date: 07/15/22  Patient will be independent with initial HEP.  Goal status: Met  2.   Patient will demonstrate improved 5xSTS < 15s. Baseline: 23.4s Goal status: Met 15 sec   LONG TERM GOALS: Target date: 08/19/22  Patient will be independent with advanced/ongoing HEP to improve outcomes and carryover.  Goal status: 07/31/22/ progressing  08/19/22 progressing   08/28/22 progressing  2.  Patient will report 75% improvement in low back pain to improve QOL.  Baseline: 7/10 Goal status 08/28/22 progressing,  Progressing 09/23/22 60%  3.  Patient will demonstrate full pain free lumbar ROM to perform ADLs.   Goal status: Progressing 07/31/22  Progressing 08/19/22   08/28/22 progressing, Progressing 09/23/22 Full ROM but som pain and tightness  4.  Patient will demonstrate improved functional strength as demonstrated by 5/5 in R hip. Baseline: 3+/5 Goal status: 07/21/22 Progressing see above   08/19/22 progressing  08/28/22 progressing, Progressing    PLAN:  PT  FREQUENCY: 2x/week  PT DURATION: 10 weeks  PLANNED INTERVENTIONS: Therapeutic exercises, Therapeutic activity, Neuromuscular re-education, Balance training, Gait training, Patient/Family education, Self Care, Joint mobilization, Stair training, Dry Needling, Electrical stimulation, Cryotherapy, Moist heat, Traction, Ionotophoresis 4mg /ml Dexamethasone, and Manual  therapy.  PLAN FOR NEXT SESSION: ASSESSMENT DUE next session,including goals    Grayce Sessions, PTA 09/30/2022, 11:55 AM

## 2022-10-02 ENCOUNTER — Encounter: Payer: Self-pay | Admitting: Physical Therapy

## 2022-10-02 ENCOUNTER — Ambulatory Visit: Payer: 59 | Admitting: Physical Therapy

## 2022-10-02 DIAGNOSIS — M546 Pain in thoracic spine: Secondary | ICD-10-CM

## 2022-10-02 DIAGNOSIS — R293 Abnormal posture: Secondary | ICD-10-CM

## 2022-10-02 DIAGNOSIS — M5459 Other low back pain: Secondary | ICD-10-CM

## 2022-10-02 DIAGNOSIS — R600 Localized edema: Secondary | ICD-10-CM

## 2022-10-02 DIAGNOSIS — M6281 Muscle weakness (generalized): Secondary | ICD-10-CM | POA: Diagnosis not present

## 2022-10-02 NOTE — Therapy (Signed)
OUTPATIENT PHYSICAL THERAPY THORACOLUMBAR TREATMENT   Patient Name: Vanessa Blair MRN: [id number] DOB:[birth year], 72 y.o., female Today's Date: 10/02/2022  END OF SESSION:  PT End of Session - 10/02/22 1150     Visit Number 27    Date for PT Re-Evaluation 10/23/22    PT Start Time 1150    PT Stop Time 1230    PT Time Calculation (min) 40 min    Behavior During Therapy WFL for tasks assessed/performed               Past Medical History:  Diagnosis Date   Acid reflux    Anemia    Anxiety    Arthritis    Hiatal hernia    Hypercholesteremia    Hypertension    Past Surgical History:  Procedure Laterality Date   BREAST SURGERY     NASAL SINUS SURGERY     TOTAL HIP ARTHROPLASTY     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   Procedure: TOTAL KNEE ARTHROPLASTY;  Surgeon: Ollen Gross, MD;  Location: [facility];  Service: Orthopedics;  Laterality: Left;   TUBAL LIGATION     Patient Active Problem List   Diagnosis Date Noted   OA (osteoarthritis) of knee 07/18/2019   Primary osteoarthritis of left knee 07/18/2019    PCP: Ardyth Gal  REFERRING PROVIDER: Venita Lick  REFERRING DIAG:  M54.50 (ICD-10-CM) - Low back pain, unspecified      Rationale for Evaluation and Treatment: Rehabilitation  THERAPY DIAG:  Muscle weakness (generalized)  Abnormal posture  Other low back pain  Pain in thoracic spine  Localized edema  ONSET DATE: 05/08/22  SUBJECTIVE:                                                                                                                                                                                           SUBJECTIVE STATEMENT: "I feel all right"   PERTINENT HISTORY:  Anxiety, arthritis   PAIN:  Are you having pain? Yes: NPRS scale: 6/10 Pain location: mid /upper back and in upper traps  Pain description: dull, constant Aggravating factors: lifting heavy objects Relieving factors: meloxicam, Tylenol  PRECAUTIONS:  None 5 WEIGHT BEARING RESTRICTIONS: No  FALLS:  Has patient fallen in last 6 months? No  LIVING ENVIRONMENT: Lives with: lives alone Lives in: House/apartment Stairs: No Has following equipment at home: None  OCCUPATION: Retired  PLOF: Independent  PATIENT GOALS: to get rid of pain   OBJECTIVE:   DIAGNOSTIC FINDINGS:  X-rays demonstrate no change in the scoliosis    SCREENING FOR RED FLAGS: Bowel or bladder incontinence: No Spinal tumors: No Cauda equina  syndrome: No Compression fracture: No Abdominal aneurysm: No  COGNITION: Overall cognitive status: Within functional limits for tasks assessed     SENSATION: WFL  MUSCLE LENGTH: Hamstrings: mild tightness BLE  POSTURE: rounded shoulders and forward head  PALPATION: Trigger points in bilateral traps and very tight.   LUMBAR ROM:   AROM Eval/ 07/17/22  09/23/22  Flexion Can touch toes, pain coming back up Can touch toes, low back stiffness Can touch toes WFL  Extension 25% with pain WFL some low back stiffness WFL some low back tightness  Right lateral flexion Fib head but pain Fib heas with pain WFL   Left lateral flexion [facility] WFL reports a pull in the low back  Right rotation 50% 25%  Pain WFL  Left rotation 50%  25% limited  WFL   (Blank rows = not tested)  LOWER EXTREMITY ROM:   grossly WFL except R hip flexion is limited to ~105d with pain    LOWER EXTREMITY MMT:    MMT Right eval Left eval Right  07/21/22 RT 08/19/22 RT  Hip flexion 3+ pain in hip 5     Hip extension       Hip abduction 3+ pain 5 4+ 4+ 5/5  Hip adduction     4+  Hip internal rotation       Hip external rotation       Knee flexion 5 5     Knee extension 3+ pain in hip 5 4 4+ 4+  Ankle dorsiflexion       Ankle plantarflexion       Ankle inversion       Ankle eversion        (Blank rows = not tested)   FUNCTIONAL TESTS:  5 times sit to stand: 23.40s   TODAY'S TREATMENT:                                                                                                                               DATE:  10/02/22 NuStep L5 x80mins  UBE L 2 x 2 min each HS curls 25lb 2x15 Leg Ext 10lb 2x10 S2S from chair 2x5 Standing OHP yellow wball 2x10 Standing trunk rotations yellow ball  Horiz Abd green 2x10  09/30/22 NuStep L5 x82mins  UBE L2 x 2 min each Shoulder Ext blue 2x12  Row & Lats 25lb 2x12  STM to upper traps and lumbar spine  HS curls 25lb 2x12 Leg Ext 10lb 2x12   09/25/22 DN to Upper traps  NuStep L5 x56mins  AR press 20# 2x10  Blue TB ext 2x10 Rows and Lats 25# 2x10 HS curls 25# 2x10  Leg ext 10# 2x10 MH to upper traps 10 mins   09/23/22 UBE L 3 27m in fwd/2 min back HS curls 25lb 2x12 Leg Ext 10lb 2x12 Row & Lats 25lb 2x12  STM to upper traps and lumbar spine  Shoulder ER red 2x10 Seated OHP yellow wball  09/16/22 UBE L 3 62m in fwd/2 min back Row & Lats 25lb 2x12 Black tband trunk flex and ext 20 x HS curls 25lb 2x12 Leg Ext 5lb 2x10 STS with wt ball press Shoulder Ext green tband 2x10 Horizontal abduction     09/10/22 NuStep L5 x 6 min UBE L2 x 2 min each Row & Lats 25lb 2x12 HS curls 25lb 2x12 Leg Ext 5lb 2x10 Shoulder Ext 10lb 2x10 Horizontal abduction   09/04/22 Nustep L 5 6 min HS curls 25lb 2x12 Leg Ext 5lb 2x10 Row & Lats 25lb 2x10 STM to lumbar and T-spine  Horiz abd green 2x10   08/28/22 Nustep L 5 6 min Standing yellow wt ball chest press, trunk ext and rotation 10 x each Row & Lats 25lb 2x10 Black tband trunk ext 20x Upright row into trunk ext 15# 2 sets 10 Cable pulley shld ext 2 sets 10 Cable pulley AR press 10 each DN uppertraps and Left rhomboid by MAlbright PT  08/26/22 UBE L 3 3 min each way NuStep L5 x 6 min HS curls 25lb 2x15 Leg Ext 5lb 2x15 Shoulder Ext 10lb 2x10  Row & Lats 25lb 2x10 Black tband trunk ext 20x S2S holding yellow ball 2x10   PATIENT EDUCATION:  Education details: HEP and POC Person educated: Patient Education  method: Explanation Education comprehension: verbalized understanding  HOME EXERCISE PROGRAM: Access Code: [email or web address] URL: [email or web address] Date: 06/10/2022 Prepared by: Cassie Freer  Exercises - Supine Lower Trunk Rotation  - 1 x daily - 7 x weekly - 2 sets - 10 reps - Supine Bridge  - 1 x daily - 7 x weekly - 2 sets - 10 reps - Supine Single Knee to Chest Stretch  - 2 x daily - 7 x weekly - 30 hold - Doorway Pec Stretch at 90 Degrees Abduction  - 2 x daily - 7 x weekly - 30 hold - Seated March with Resistance  - 1 x daily - 7 x weekly - 2 sets - 10 reps  ASSESSMENT:  CLINICAL IMPRESSION:  Tightness traps and Tps remains Continued with some postural exercises to reinforce core strength and awareness. Increase fatigue today with sit to stands. Postural cues needed throughout. Pt has forward head and shoulders at rest.   OBJECTIVE IMPAIRMENTS: difficulty walking, decreased ROM, decreased strength, and pain.   REHAB POTENTIAL: Good  CLINICAL DECISION MAKING: Stable/uncomplicated  EVALUATION COMPLEXITY: Low   GOALS: Goals reviewed with patient? Yes  SHORT TERM GOALS: Target date: 07/15/22  Patient will be independent with initial HEP.  Goal status: Met  2.   Patient will demonstrate improved 5xSTS < 15s. Baseline: 23.4s Goal status: Met 15 sec   LONG TERM GOALS: Target date: 08/19/22  Patient will be independent with advanced/ongoing HEP to improve outcomes and carryover.  Goal status: Met 10/02/22  2.  Patient will report 75% improvement in low back pain to improve QOL.  Baseline: 7/10 Goal status 08/28/22 progressing,  Progressing 09/23/22 60%  3.  Patient will demonstrate full pain free lumbar ROM to perform ADLs.   Goal status: Progressing 07/31/22  Progressing 08/19/22   08/28/22 progressing, Progressing 09/23/22 Full ROM but som pain and tightness  4.  Patient will demonstrate improved functional strength as demonstrated by 5/5 in R hip. Baseline:  3+/5 Goal status: 07/21/22 Progressing see above   08/19/22 progressing  08/28/22 progressing, Progressing    PLAN:  PT FREQUENCY: 2x/week  PT DURATION: 10 weeks  PLANNED INTERVENTIONS: Therapeutic exercises, Therapeutic activity, Neuromuscular  re-education, Balance training, Gait training, Patient/Family education, Self Care, Joint mobilization, Stair training, Dry Needling, Electrical stimulation, Cryotherapy, Moist heat, Traction, Ionotophoresis 4mg /ml Dexamethasone, and Manual therapy.  PLAN FOR NEXT SESSION: ASSESSMENT DUE next session,including goals    Grayce Sessions, PTA 10/02/2022, 11:50 AM

## 2022-10-07 ENCOUNTER — Ambulatory Visit: Payer: 59 | Admitting: Physical Therapy

## 2022-10-14 ENCOUNTER — Encounter: Payer: Self-pay | Admitting: Physical Therapy

## 2022-10-14 ENCOUNTER — Ambulatory Visit: Payer: 59 | Attending: Orthopedic Surgery | Admitting: Physical Therapy

## 2022-10-14 DIAGNOSIS — M546 Pain in thoracic spine: Secondary | ICD-10-CM | POA: Insufficient documentation

## 2022-10-14 DIAGNOSIS — M6281 Muscle weakness (generalized): Secondary | ICD-10-CM | POA: Insufficient documentation

## 2022-10-14 DIAGNOSIS — M5459 Other low back pain: Secondary | ICD-10-CM | POA: Insufficient documentation

## 2022-10-14 DIAGNOSIS — R252 Cramp and spasm: Secondary | ICD-10-CM | POA: Insufficient documentation

## 2022-10-14 DIAGNOSIS — R293 Abnormal posture: Secondary | ICD-10-CM | POA: Diagnosis present

## 2022-10-14 NOTE — Therapy (Signed)
OUTPATIENT PHYSICAL THERAPY THORACOLUMBAR TREATMENT   Patient Name: Vanessa Blair MRN: [id number] DOB:05-17-1950, 72 y.o., female Today's Date: 10/14/2022  END OF SESSION:  PT End of Session - 10/14/22 1151     Visit Number 28    Date for PT Re-Evaluation 10/23/22    PT Start Time 1151    PT Stop Time 1230    PT Time Calculation (min) 39 min    Activity Tolerance Patient tolerated treatment well    Behavior During Therapy WFL for tasks assessed/performed               Past Medical History:  Diagnosis Date   Acid reflux    Anemia    Anxiety    Arthritis    Hiatal hernia    Hypercholesteremia    Hypertension    Past Surgical History:  Procedure Laterality Date   BREAST SURGERY     NASAL SINUS SURGERY     TOTAL HIP ARTHROPLASTY     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   Procedure: TOTAL KNEE ARTHROPLASTY;  Surgeon: Ollen Gross, MD;  Location: [facility];  Service: Orthopedics;  Laterality: Left;   TUBAL LIGATION     Patient Active Problem List   Diagnosis Date Noted   OA (osteoarthritis) of knee 07/18/2019   Primary osteoarthritis of left knee 07/18/2019    PCP: Ardyth Gal  REFERRING PROVIDER: Venita Lick  REFERRING DIAG:  M54.50 (ICD-10-CM) - Low back pain, unspecified      Rationale for Evaluation and Treatment: Rehabilitation  THERAPY DIAG:  Muscle weakness (generalized)  Other low back pain  Abnormal posture  ONSET DATE: 05/08/22  SUBJECTIVE:                                                                                                                                                                                           SUBJECTIVE STATEMENT: Pain in the upper shoulders   PERTINENT HISTORY:  Anxiety, arthritis   PAIN:  Are you having pain? Yes: NPRS scale: 7/10 Pain location: mid /upper back and in upper traps  Pain description: dull, constant Aggravating factors: lifting heavy objects Relieving factors: meloxicam,  Tylenol  PRECAUTIONS: None 5 WEIGHT BEARING RESTRICTIONS: No  FALLS:  Has patient fallen in last 6 months? No  LIVING ENVIRONMENT: Lives with: lives alone Lives in: House/apartment Stairs: No Has following equipment at home: None  OCCUPATION: Retired  PLOF: Independent  PATIENT GOALS: to get rid of pain   OBJECTIVE:   DIAGNOSTIC FINDINGS:  X-rays demonstrate no change in the scoliosis    SCREENING FOR RED FLAGS: Bowel or bladder incontinence: No Spinal tumors: No  Cauda equina syndrome: No Compression fracture: No Abdominal aneurysm: No  COGNITION: Overall cognitive status: Within functional limits for tasks assessed     SENSATION: WFL  MUSCLE LENGTH: Hamstrings: mild tightness BLE  POSTURE: rounded shoulders and forward head  PALPATION: Trigger points in bilateral traps and very tight.   LUMBAR ROM:   AROM Eval/ 07/17/22  09/23/22  Flexion Can touch toes, pain coming back up Can touch toes, low back stiffness Can touch toes WFL  Extension 25% with pain WFL some low back stiffness WFL some low back tightness  Right lateral flexion Fib head but pain Fib heas with pain WFL   Left lateral flexion [facility]  [facility] WFL reports a pull in the low back  Right rotation 50% 25%  Pain WFL  Left rotation 50%  25% limited  WFL   (Blank rows = not tested)  LOWER EXTREMITY ROM:   grossly WFL except R hip flexion is limited to ~105d with pain    LOWER EXTREMITY MMT:    MMT Right eval Left eval Right  07/21/22 RT 08/19/22 RT  Hip flexion 3+ pain in hip 5     Hip extension       Hip abduction 3+ pain 5 4+ 4+ 5/5  Hip adduction     4+  Hip internal rotation       Hip external rotation       Knee flexion 5 5     Knee extension 3+ pain in hip 5 4 4+ 4+  Ankle dorsiflexion       Ankle plantarflexion       Ankle inversion       Ankle eversion        (Blank rows = not tested)   FUNCTIONAL TESTS:  5 times sit to stand: 23.40s   TODAY'S TREATMENT:                                                                                                                               DATE:  10/14/22 NuStep L 5 x 5 min Lead PT M. Albright performed DN to UT UBE L 2 x 2 min each HS Curls 25lb 2x12 Leg Ext 10lb 2x12 Sated Rows 25lb 2x10 MHP cervical spine   10/02/22 NuStep L5 x69mins  UBE L 2 x 2 min each HS curls 25lb 2x15 Leg Ext 10lb 2x10 S2S from chair 2x5 Standing OHP yellow wball 2x10 Standing trunk rotations yellow ball  Horiz Abd green 2x10  09/30/22 NuStep L5 x34mins  UBE L2 x 2 min each Shoulder Ext blue 2x12  Row & Lats 25lb 2x12  STM to upper traps and lumbar spine  HS curls 25lb 2x12 Leg Ext 10lb 2x12   09/25/22 DN to Upper traps  NuStep L5 x6mins  AR press 20# 2x10  Blue TB ext 2x10 Rows and Lats 25# 2x10 HS curls 25# 2x10  Leg ext 10# 2x10 MH to upper traps 10 mins  PATIENT EDUCATION:  Education details: HEP and POC Person educated: Patient Education method: Explanation Education comprehension: verbalized understanding  HOME EXERCISE PROGRAM: Access Code: [email or web address] URL: [email or web address] Date: 06/10/2022 Prepared by: Cassie Freer  Exercises - Supine Lower Trunk Rotation  - 1 x daily - 7 x weekly - 2 sets - 10 reps - Supine Bridge  - 1 x daily - 7 x weekly - 2 sets - 10 reps - Supine Single Knee to Chest Stretch  - 2 x daily - 7 x weekly - 30 hold - Doorway Pec Stretch at 90 Degrees Abduction  - 2 x daily - 7 x weekly - 30 hold - Seated March with Resistance  - 1 x daily - 7 x weekly - 2 sets - 10 reps  ASSESSMENT:  CLINICAL IMPRESSION:  Tightness in upper traps remains. Lead pT assisted in session providing pt with DN. Continued with some postural exercises to reinforce core strength and awareness. Postural cues needed throughout. Pt has forward head and shoulders at rest.  Possible D/C next week  OBJECTIVE IMPAIRMENTS: difficulty walking, decreased ROM, decreased strength, and pain.   REHAB  POTENTIAL: Good  CLINICAL DECISION MAKING: Stable/uncomplicated  EVALUATION COMPLEXITY: Low   GOALS: Goals reviewed with patient? Yes  SHORT TERM GOALS: Target date: 07/15/22  Patient will be independent with initial HEP.  Goal status: Met  2.   Patient will demonstrate improved 5xSTS < 15s. Baseline: 23.4s Goal status: Met 15 sec   LONG TERM GOALS: Target date: 08/19/22  Patient will be independent with advanced/ongoing HEP to improve outcomes and carryover.  Goal status: Met 10/02/22  2.  Patient will report 75% improvement in low back pain to improve QOL.  Baseline: 7/10 Goal status 08/28/22 progressing,  Progressing 09/23/22 60%  3.  Patient will demonstrate full pain free lumbar ROM to perform ADLs.   Goal status: Progressing 07/31/22  Progressing 08/19/22   08/28/22 progressing, Progressing 09/23/22 Full ROM but som pain and tightness  4.  Patient will demonstrate improved functional strength as demonstrated by 5/5 in R hip. Baseline: 3+/5 Goal status: 07/21/22 Progressing see above   08/19/22 progressing  08/28/22 progressing, Progressing    PLAN:  PT FREQUENCY: 2x/week  PT DURATION: 10 weeks  PLANNED INTERVENTIONS: Therapeutic exercises, Therapeutic activity, Neuromuscular re-education, Balance training, Gait training, Patient/Family education, Self Care, Joint mobilization, Stair training, Dry Needling, Electrical stimulation, Cryotherapy, Moist heat, Traction, Ionotophoresis 4mg /ml Dexamethasone, and Manual therapy.  PLAN FOR NEXT SESSION: Postural strenght    Grayce Sessions, PTA 10/14/2022, 11:52 AM

## 2022-10-16 ENCOUNTER — Ambulatory Visit: Payer: 59 | Admitting: Physical Therapy

## 2022-10-16 ENCOUNTER — Encounter: Payer: Self-pay | Admitting: Physical Therapy

## 2022-10-16 DIAGNOSIS — R293 Abnormal posture: Secondary | ICD-10-CM

## 2022-10-16 DIAGNOSIS — M6281 Muscle weakness (generalized): Secondary | ICD-10-CM | POA: Diagnosis not present

## 2022-10-16 DIAGNOSIS — R252 Cramp and spasm: Secondary | ICD-10-CM

## 2022-10-16 DIAGNOSIS — M5459 Other low back pain: Secondary | ICD-10-CM

## 2022-10-16 DIAGNOSIS — M546 Pain in thoracic spine: Secondary | ICD-10-CM

## 2022-10-16 NOTE — Therapy (Signed)
OUTPATIENT PHYSICAL THERAPY THORACOLUMBAR TREATMENT   Patient Name: Vanessa Blair MRN: [id number] DOB:04-12-1950, 72 y.o., female Today's Date: 10/16/2022  END OF SESSION:  PT End of Session - 10/16/22 1150     Visit Number 29    Date for PT Re-Evaluation 10/23/22    PT Start Time 1151    PT Stop Time 1230    PT Time Calculation (min) 39 min    Behavior During Therapy WFL for tasks assessed/performed               Past Medical History:  Diagnosis Date   Acid reflux    Anemia    Anxiety    Arthritis    Hiatal hernia    Hypercholesteremia    Hypertension    Past Surgical History:  Procedure Laterality Date   BREAST SURGERY     NASAL SINUS SURGERY     TOTAL HIP ARTHROPLASTY     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   Procedure: TOTAL KNEE ARTHROPLASTY;  Surgeon: Ollen Gross, MD;  Location: [facility];  Service: Orthopedics;  Laterality: Left;   TUBAL LIGATION     Patient Active Problem List   Diagnosis Date Noted   OA (osteoarthritis) of knee 07/18/2019   Primary osteoarthritis of left knee 07/18/2019    PCP: Ardyth Gal  REFERRING PROVIDER: Venita Lick  REFERRING DIAG:  M54.50 (ICD-10-CM) - Low back pain, unspecified      Rationale for Evaluation and Treatment: Rehabilitation  THERAPY DIAG:  Muscle weakness (generalized)  Other low back pain  Abnormal posture  Pain in thoracic spine  Cramp and spasm  ONSET DATE: 05/08/22  SUBJECTIVE:                                                                                                                                                                                           SUBJECTIVE STATEMENT: "I all right"   PERTINENT HISTORY:  Anxiety, arthritis   PAIN:  Are you having pain? Yes: NPRS scale: 7/10 Pain location: mid /upper back and in upper traps  Pain description: stiff Aggravating factors: lifting heavy objects Relieving factors: meloxicam, Tylenol  PRECAUTIONS: None 5 WEIGHT  BEARING RESTRICTIONS: No  FALLS:  Has patient fallen in last 6 months? No  LIVING ENVIRONMENT: Lives with: lives alone Lives in: House/apartment Stairs: No Has following equipment at home: None  OCCUPATION: Retired  PLOF: Independent  PATIENT GOALS: to get rid of pain   OBJECTIVE:   DIAGNOSTIC FINDINGS:  X-rays demonstrate no change in the scoliosis    SCREENING FOR RED FLAGS: Bowel or bladder incontinence: No Spinal tumors: No Cauda equina syndrome:  No Compression fracture: No Abdominal aneurysm: No  COGNITION: Overall cognitive status: Within functional limits for tasks assessed     SENSATION: WFL  MUSCLE LENGTH: Hamstrings: mild tightness BLE  POSTURE: rounded shoulders and forward head  PALPATION: Trigger points in bilateral traps and very tight.   LUMBAR ROM:   AROM Eval/ 07/17/22  09/23/22  Flexion Can touch toes, pain coming back up Can touch toes, low back stiffness Can touch toes WFL  Extension 25% with pain WFL some low back stiffness WFL some low back tightness  Right lateral flexion Fib head but pain Fib heas with pain WFL   Left lateral flexion [facility]  [facility] WFL reports a pull in the low back  Right rotation 50% 25%  Pain WFL  Left rotation 50%  25% limited  WFL   (Blank rows = not tested)  LOWER EXTREMITY ROM:   grossly WFL except R hip flexion is limited to ~105d with pain    LOWER EXTREMITY MMT:    MMT Right eval Left eval Right  07/21/22 RT 08/19/22 RT  Hip flexion 3+ pain in hip 5     Hip extension       Hip abduction 3+ pain 5 4+ 4+ 5/5  Hip adduction     4+  Hip internal rotation       Hip external rotation       Knee flexion 5 5     Knee extension 3+ pain in hip 5 4 4+ 4+  Ankle dorsiflexion       Ankle plantarflexion       Ankle inversion       Ankle eversion        (Blank rows = not tested)   FUNCTIONAL TESTS:  5 times sit to stand: 23.40s   TODAY'S TREATMENT:                                                                                                                               DATE:  10/16/22 UBE L2.5 x 2 min each NuStep L 5 x 6 min HS Curls 25lb 2x15 Leg Ext 10lb 2x15 S2S OHP yellow ball 2x10 Shoulder Ext 5lb 2x12  Row & Lats 20lb 2x10   10/14/22 NuStep L 5 x 5 min Lead PT M. Albright performed DN to UT UBE L 2 x 2 min each HS Curls 25lb 2x12 Leg Ext 10lb 2x12 Sated Rows 25lb 2x10 MHP cervical spine   10/02/22 NuStep L5 x83mins  UBE L 2 x 2 min each HS curls 25lb 2x15 Leg Ext 10lb 2x10 S2S from chair 2x5 Standing OHP yellow wball 2x10 Standing trunk rotations yellow ball  Horiz Abd green 2x10  09/30/22 NuStep L5 x24mins  UBE L2 x 2 min each Shoulder Ext blue 2x12  Row & Lats 25lb 2x12  STM to upper traps and lumbar spine  HS curls 25lb 2x12 Leg Ext 10lb 2x12   09/25/22 DN to Upper  traps  NuStep L5 x44mins  AR press 20# 2x10  Blue TB ext 2x10 Rows and Lats 25# 2x10 HS curls 25# 2x10  Leg ext 10# 2x10 MH to upper traps 10 mins     PATIENT EDUCATION:  Education details: HEP and POC Person educated: Patient Education method: Explanation Education comprehension: verbalized understanding  HOME EXERCISE PROGRAM: Access Code: [email or web address] URL: [email or web address] Date: 06/10/2022 Prepared by: Cassie Freer  Exercises - Supine Lower Trunk Rotation  - 1 x daily - 7 x weekly - 2 sets - 10 reps - Supine Bridge  - 1 x daily - 7 x weekly - 2 sets - 10 reps - Supine Single Knee to Chest Stretch  - 2 x daily - 7 x weekly - 30 hold - Doorway Pec Stretch at 90 Degrees Abduction  - 2 x daily - 7 x weekly - 30 hold - Seated March with Resistance  - 1 x daily - 7 x weekly - 2 sets - 10 reps  ASSESSMENT:  CLINICAL IMPRESSION:  Tightness in upper traps remains evident by tissue elasticity and tissue density. Continued with some postural exercises to reinforce core strength and awareness. Postural cues needed throughout. Pt has forward head and shoulders at rest. Increase  reps tolerated with all interventions. Some fatigue present with sit to stands.  Possible D/C next week  OBJECTIVE IMPAIRMENTS: difficulty walking, decreased ROM, decreased strength, and pain.   REHAB POTENTIAL: Good  CLINICAL DECISION MAKING: Stable/uncomplicated  EVALUATION COMPLEXITY: Low   GOALS: Goals reviewed with patient? Yes  SHORT TERM GOALS: Target date: 07/15/22  Patient will be independent with initial HEP.  Goal status: Met  2.   Patient will demonstrate improved 5xSTS < 15s. Baseline: 23.4s Goal status: Met 15 sec   LONG TERM GOALS: Target date: 08/19/22  Patient will be independent with advanced/ongoing HEP to improve outcomes and carryover.  Goal status: Met 10/02/22  2.  Patient will report 75% improvement in low back pain to improve QOL.  Baseline: 7/10 Goal status 08/28/22 progressing,  Progressing 09/23/22 60%  3.  Patient will demonstrate full pain free lumbar ROM to perform ADLs.   Goal status: Progressing 07/31/22  Progressing 08/19/22   08/28/22 progressing, Progressing 09/23/22 Full ROM but som pain and tightness  4.  Patient will demonstrate improved functional strength as demonstrated by 5/5 in R hip. Baseline: 3+/5 Goal status: 07/21/22 Progressing see above   08/19/22 progressing  08/28/22 progressing, Progressing    PLAN:  PT FREQUENCY: 2x/week  PT DURATION: 10 weeks  PLANNED INTERVENTIONS: Therapeutic exercises, Therapeutic activity, Neuromuscular re-education, Balance training, Gait training, Patient/Family education, Self Care, Joint mobilization, Stair training, Dry Needling, Electrical stimulation, Cryotherapy, Moist heat, Traction, Ionotophoresis 4mg /ml Dexamethasone, and Manual therapy.  PLAN FOR NEXT SESSION: Postural strenght    Grayce Sessions, PTA 10/16/2022, 11:51 AM

## 2022-10-20 NOTE — Therapy (Signed)
OUTPATIENT PHYSICAL THERAPY THORACOLUMBAR TREATMENT  Progress Note Reporting Period 09/04/22 to 10/21/22  See note below for Objective Data and Assessment of Progress/Goals.     Patient Name: Vanessa Blair MRN: [id number] DOB:[birth year], 72 y.o., female Today's Date: 10/21/2022  END OF SESSION:  PT End of Session - 10/21/22 1150     Visit Number 30    Date for PT Re-Evaluation 11/06/22    PT Start Time 1150    PT Stop Time 1230    PT Time Calculation (min) 40 min    Behavior During Therapy WFL for tasks assessed/performed                Past Medical History:  Diagnosis Date   Acid reflux    Anemia    Anxiety    Arthritis    Hiatal hernia    Hypercholesteremia    Hypertension    Past Surgical History:  Procedure Laterality Date   BREAST SURGERY     NASAL SINUS SURGERY     TOTAL HIP ARTHROPLASTY     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   Procedure: TOTAL KNEE ARTHROPLASTY;  Surgeon: Ollen Gross, MD;  Location: [facility];  Service: Orthopedics;  Laterality: Left;   TUBAL LIGATION     Patient Active Problem List   Diagnosis Date Noted   OA (osteoarthritis) of knee 07/18/2019   Primary osteoarthritis of left knee 07/18/2019    PCP: Ardyth Gal  REFERRING PROVIDER: Venita Lick  REFERRING DIAG:  M54.50 (ICD-10-CM) - Low back pain, unspecified      Rationale for Evaluation and Treatment: Rehabilitation  THERAPY DIAG:  Muscle weakness (generalized)  Other low back pain  Abnormal posture  Pain in thoracic spine  Cramp and spasm  ONSET DATE: 05/08/22  SUBJECTIVE:                                                                                                                                                                                           SUBJECTIVE STATEMENT: "I all right"   PERTINENT HISTORY:  Anxiety, arthritis   PAIN:  Are you having pain? Yes: NPRS scale: 7/10 Pain location: mid /upper back and in upper traps  Pain  description: stiff Aggravating factors: lifting heavy objects Relieving factors: meloxicam, Tylenol  PRECAUTIONS: None 5 WEIGHT BEARING RESTRICTIONS: No  FALLS:  Has patient fallen in last 6 months? No  LIVING ENVIRONMENT: Lives with: lives alone Lives in: House/apartment Stairs: No Has following equipment at home: None  OCCUPATION: Retired  PLOF: Independent  PATIENT GOALS: to get rid of pain   OBJECTIVE:   DIAGNOSTIC FINDINGS:  X-rays demonstrate no  change in the scoliosis    SCREENING FOR RED FLAGS: Bowel or bladder incontinence: No Spinal tumors: No Cauda equina syndrome: No Compression fracture: No Abdominal aneurysm: No  COGNITION: Overall cognitive status: Within functional limits for tasks assessed     SENSATION: WFL  MUSCLE LENGTH: Hamstrings: mild tightness BLE  POSTURE: rounded shoulders and forward head  PALPATION: Trigger points in bilateral traps and very tight.   LUMBAR ROM:   AROM Eval/ 07/17/22  09/23/22  Flexion Can touch toes, pain coming back up Can touch toes, low back stiffness Can touch toes WFL  Extension 25% with pain WFL some low back stiffness WFL some low back tightness  Right lateral flexion Fib head but pain Fib heas with pain WFL   Left lateral flexion [facility]  [facility] WFL reports a pull in the low back  Right rotation 50% 25%  Pain WFL  Left rotation 50%  25% limited  WFL   (Blank rows = not tested)  LOWER EXTREMITY ROM:   grossly WFL except R hip flexion is limited to ~105d with pain    LOWER EXTREMITY MMT:    MMT Right eval Left eval Right  07/21/22 RT 08/19/22 RT  Hip flexion 3+ pain in hip 5     Hip extension       Hip abduction 3+ pain 5 4+ 4+ 5/5  Hip adduction     4+  Hip internal rotation       Hip external rotation       Knee flexion 5 5     Knee extension 3+ pain in hip 5 4 4+ 4+  Ankle dorsiflexion       Ankle plantarflexion       Ankle inversion       Ankle eversion        (Blank rows = not  tested)   FUNCTIONAL TESTS:  5 times sit to stand: 23.40s   TODAY'S TREATMENT:                                                                                                                              DATE:  10/21/22 Recheck goals and recert if needed  NuStep L5 x27mins  Straight arm pulls 10# 2x10 Rows and Lats 20# 2x10 Horizontal abd green 2x10 2# shoulder flexion 2x10 2# lateral raise 2x10 HS Curls 25lb 2x15 Leg Ext 10lb 2x15   10/16/22 UBE L2.5 x 2 min each NuStep L 5 x 6 min HS Curls 25lb 2x15 Leg Ext 10lb 2x15 S2S OHP yellow ball 2x10 Shoulder Ext 5lb 2x12  Row & Lats 20lb 2x10   10/14/22 NuStep L 5 x 5 min Lead PT M. Albright performed DN to UT UBE L 2 x 2 min each HS Curls 25lb 2x12 Leg Ext 10lb 2x12 Sated Rows 25lb 2x10 MHP cervical spine   10/02/22 NuStep L5 x61mins  UBE L 2 x 2 min each HS curls 25lb 2x15 Leg Ext  10lb 2x10 S2S from chair 2x5 Standing OHP yellow wball 2x10 Standing trunk rotations yellow ball  Horiz Abd green 2x10  09/30/22 NuStep L5 x82mins  UBE L2 x 2 min each Shoulder Ext blue 2x12  Row & Lats 25lb 2x12  STM to upper traps and lumbar spine  HS curls 25lb 2x12 Leg Ext 10lb 2x12   09/25/22 DN to Upper traps  NuStep L5 x71mins  AR press 20# 2x10  Blue TB ext 2x10 Rows and Lats 25# 2x10 HS curls 25# 2x10  Leg ext 10# 2x10 MH to upper traps 10 mins     PATIENT EDUCATION:  Education details: HEP and POC Person educated: Patient Education method: Explanation Education comprehension: verbalized understanding  HOME EXERCISE PROGRAM: Access Code: [email or web address] URL: [email or web address] Date: 06/10/2022 Prepared by: Cassie Freer  Exercises - Supine Lower Trunk Rotation  - 1 x daily - 7 x weekly - 2 sets - 10 reps - Supine Bridge  - 1 x daily - 7 x weekly - 2 sets - 10 reps - Supine Single Knee to Chest Stretch  - 2 x daily - 7 x weekly - 30 hold - Doorway Pec Stretch at 90 Degrees Abduction  - 2 x daily - 7 x  weekly - 30 hold - Seated March with Resistance  - 1 x daily - 7 x weekly - 2 sets - 10 reps  ASSESSMENT:  CLINICAL IMPRESSION:  patient continues to have some pain in back. I reviewed her goals, to which she has met for the most part but still has some pain. Continued with some postural exercises to reinforce core strength and awareness. Postural cues needed throughout. Spoke to patient about transitioning back in to a independent program. She reports she has appts scheduled until the 1st of August and feels like PT has been helping. She agreed to continue for the remaining appts before discharge. She is still having pain with houeshold activities like mopping.   OBJECTIVE IMPAIRMENTS: difficulty walking, decreased ROM, decreased strength, and pain.   REHAB POTENTIAL: Good  CLINICAL DECISION MAKING: Stable/uncomplicated  EVALUATION COMPLEXITY: Low   GOALS: Goals reviewed with patient? Yes  SHORT TERM GOALS: Target date: 07/15/22  Patient will be independent with initial HEP.  Goal status: Met  2.   Patient will demonstrate improved 5xSTS < 15s. Baseline: 23.4s Goal status: Met 15 sec   LONG TERM GOALS: Target date: 11/06/22  Patient will be independent with advanced/ongoing HEP to improve outcomes and carryover.  Goal status: Met 10/02/22  2.  Patient will report 75% improvement in low back pain to improve QOL.  Baseline: 7/10 Goal status 08/28/22 progressing,  Progressing 09/23/22 60% 10/21/22  3.  Patient will demonstrate full pain free lumbar ROM to perform ADLs.   Goal status: Progressing 07/31/22  Progressing 08/19/22   08/28/22 progressing, Progressing 09/23/22 Full ROM but some pain and tightness Pain with R lateral flexion and end range extension 10/21/22  4.  Patient will demonstrate improved functional strength as demonstrated by 5/5 in R hip. Baseline: 3+/5 Goal status: 07/21/22 Progressing see above   08/19/22 progressing  08/28/22 progressing, Progressing, 5/5 MET 10/21/22     PLAN:  PT FREQUENCY: 2x/week  PT DURATION: 10 weeks  PLANNED INTERVENTIONS: Therapeutic exercises, Therapeutic activity, Neuromuscular re-education, Balance training, Gait training, Patient/Family education, Self Care, Joint mobilization, Stair training, Dry Needling, Electrical stimulation, Cryotherapy, Moist heat, Traction, Ionotophoresis 4mg /ml Dexamethasone, and Manual therapy.  PLAN FOR NEXT SESSION: Postural and low back strengthening  [place], PT 10/21/2022, 12:38 PM

## 2022-10-21 ENCOUNTER — Ambulatory Visit: Payer: 59

## 2022-10-21 DIAGNOSIS — R293 Abnormal posture: Secondary | ICD-10-CM

## 2022-10-21 DIAGNOSIS — R252 Cramp and spasm: Secondary | ICD-10-CM

## 2022-10-21 DIAGNOSIS — M546 Pain in thoracic spine: Secondary | ICD-10-CM

## 2022-10-21 DIAGNOSIS — M6281 Muscle weakness (generalized): Secondary | ICD-10-CM | POA: Diagnosis not present

## 2022-10-21 DIAGNOSIS — M5459 Other low back pain: Secondary | ICD-10-CM

## 2022-10-23 ENCOUNTER — Encounter: Payer: Self-pay | Admitting: Physical Therapy

## 2022-10-23 ENCOUNTER — Ambulatory Visit: Payer: 59 | Admitting: Physical Therapy

## 2022-10-23 DIAGNOSIS — M6281 Muscle weakness (generalized): Secondary | ICD-10-CM

## 2022-10-23 DIAGNOSIS — R293 Abnormal posture: Secondary | ICD-10-CM

## 2022-10-23 DIAGNOSIS — M5459 Other low back pain: Secondary | ICD-10-CM

## 2022-10-23 DIAGNOSIS — M546 Pain in thoracic spine: Secondary | ICD-10-CM

## 2022-10-23 NOTE — Therapy (Signed)
OUTPATIENT PHYSICAL THERAPY THORACOLUMBAR TREATMENT    Patient Name: Vanessa Blair MRN: [id number] DOB:[birth year], 72 y.o., female Today's Date: 10/23/2022  END OF SESSION:  PT End of Session - 10/23/22 1150     Visit Number 31    Date for PT Re-Evaluation 11/06/22    PT Start Time 1150    PT Stop Time 1230    PT Time Calculation (min) 40 min    Activity Tolerance Patient tolerated treatment well    Behavior During Therapy WFL for tasks assessed/performed                Past Medical History:  Diagnosis Date   Acid reflux    Anemia    Anxiety    Arthritis    Hiatal hernia    Hypercholesteremia    Hypertension    Past Surgical History:  Procedure Laterality Date   BREAST SURGERY     NASAL SINUS SURGERY     TOTAL HIP ARTHROPLASTY     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   Procedure: TOTAL KNEE ARTHROPLASTY;  Surgeon: Ollen Gross, MD;  Location: [facility];  Service: Orthopedics;  Laterality: Left;   TUBAL LIGATION     Patient Active Problem List   Diagnosis Date Noted   OA (osteoarthritis) of knee 07/18/2019   Primary osteoarthritis of left knee 07/18/2019    PCP: Ardyth Gal  REFERRING PROVIDER: Venita Lick  REFERRING DIAG:  M54.50 (ICD-10-CM) - Low back pain, unspecified      Rationale for Evaluation and Treatment: Rehabilitation  THERAPY DIAG:  Other low back pain  Abnormal posture  Muscle weakness (generalized)  Pain in thoracic spine  ONSET DATE: 05/08/22  SUBJECTIVE:                                                                                                                                                                                           SUBJECTIVE STATEMENT: "ok"   PERTINENT HISTORY:  Anxiety, arthritis   PAIN:  Are you having pain? Yes: NPRS scale: 6/10 Pain location: mid /upper back and in upper traps  Pain description: stiff Aggravating factors: lifting heavy objects Relieving factors: meloxicam,  Tylenol  PRECAUTIONS: None 5 WEIGHT BEARING RESTRICTIONS: No  FALLS:  Has patient fallen in last 6 months? No  LIVING ENVIRONMENT: Lives with: lives alone Lives in: House/apartment Stairs: No Has following equipment at home: None  OCCUPATION: Retired  PLOF: Independent  PATIENT GOALS: to get rid of pain   OBJECTIVE:   DIAGNOSTIC FINDINGS:  X-rays demonstrate no change in the scoliosis    SCREENING FOR RED FLAGS: Bowel or bladder incontinence: No Spinal  tumors: No Cauda equina syndrome: No Compression fracture: No Abdominal aneurysm: No  COGNITION: Overall cognitive status: Within functional limits for tasks assessed     SENSATION: WFL  MUSCLE LENGTH: Hamstrings: mild tightness BLE  POSTURE: rounded shoulders and forward head  PALPATION: Trigger points in bilateral traps and very tight.   LUMBAR ROM:   AROM Eval/ 07/17/22  09/23/22  Flexion Can touch toes, pain coming back up Can touch toes, low back stiffness Can touch toes WFL  Extension 25% with pain WFL some low back stiffness WFL some low back tightness  Right lateral flexion Fib head but pain Fib heas with pain WFL   Left lateral flexion [facility]  [facility] WFL reports a pull in the low back  Right rotation 50% 25%  Pain WFL  Left rotation 50%  25% limited  WFL   (Blank rows = not tested)  LOWER EXTREMITY ROM:   grossly WFL except R hip flexion is limited to ~105d with pain    LOWER EXTREMITY MMT:    MMT Right eval Left eval Right  07/21/22 RT 08/19/22 RT  Hip flexion 3+ pain in hip 5     Hip extension       Hip abduction 3+ pain 5 4+ 4+ 5/5  Hip adduction     4+  Hip internal rotation       Hip external rotation       Knee flexion 5 5     Knee extension 3+ pain in hip 5 4 4+ 4+  Ankle dorsiflexion       Ankle plantarflexion       Ankle inversion       Ankle eversion        (Blank rows = not tested)   FUNCTIONAL TESTS:  5 times sit to stand: 23.40s   TODAY'S TREATMENT:                                                                                                                               DATE:  10/23/22 NuStep L5 x54mins  HS Curls 25lb 2x15 Leg Ext 10lb 2x15 Straight arm pulls 10# 2x15 Standing rows 10lb 2x15 Horizontal abd green 2x10 S2S from standard chair x10, x5 OHP yellow ball 2x10 2# lateral raise 2x10   10/21/22 Recheck goals and recert if needed  NuStep L5 x48mins  Straight arm pulls 10# 2x10 Rows and Lats 20# 2x10 Horizontal abd green 2x10 2# shoulder flexion 2x10 2# lateral raise 2x10 HS Curls 25lb 2x15 Leg Ext 10lb 2x15   10/16/22 UBE L2.5 x 2 min each NuStep L 5 x 6 min HS Curls 25lb 2x15 Leg Ext 10lb 2x15 S2S OHP yellow ball 2x10 Shoulder Ext 5lb 2x12  Row & Lats 20lb 2x10   10/14/22 NuStep L 5 x 5 min Lead PT M. Albright performed DN to UT UBE L 2 x 2 min each HS Curls 25lb 2x12 Leg Ext 10lb 2x12 Sated Rows  25lb 2x10 MHP cervical spine   10/02/22 NuStep L5 x66mins  UBE L 2 x 2 min each HS curls 25lb 2x15 Leg Ext 10lb 2x10 S2S from chair 2x5 Standing OHP yellow wball 2x10 Standing trunk rotations yellow ball  Horiz Abd green 2x10  09/30/22 NuStep L5 x11mins  UBE L2 x 2 min each Shoulder Ext blue 2x12  Row & Lats 25lb 2x12  STM to upper traps and lumbar spine  HS curls 25lb 2x12 Leg Ext 10lb 2x12   PATIENT EDUCATION:  Education details: HEP and POC Person educated: Patient Education method: Explanation Education comprehension: verbalized understanding  HOME EXERCISE PROGRAM: Access Code: [email or web address] URL: [email or web address] Date: 06/10/2022 Prepared by: Cassie Freer  Exercises - Supine Lower Trunk Rotation  - 1 x daily - 7 x weekly - 2 sets - 10 reps - Supine Bridge  - 1 x daily - 7 x weekly - 2 sets - 10 reps - Supine Single Knee to Chest Stretch  - 2 x daily - 7 x weekly - 30 hold - Doorway Pec Stretch at 90 Degrees Abduction  - 2 x daily - 7 x weekly - 30 hold - Seated March with Resistance  - 1 x  daily - 7 x weekly - 2 sets - 10 reps  ASSESSMENT:  CLINICAL IMPRESSION:  patient continues to have some pain in back. Continued with some postural exercises to reinforce core strength and awareness. Postural cues needed throughout. She is still having pain with houeshold activities like mopping, despite meeting most goals. She has forward head and rounded shoulders at rest.   OBJECTIVE IMPAIRMENTS: difficulty walking, decreased ROM, decreased strength, and pain.   REHAB POTENTIAL: Good  CLINICAL DECISION MAKING: Stable/uncomplicated  EVALUATION COMPLEXITY: Low   GOALS: Goals reviewed with patient? Yes  SHORT TERM GOALS: Target date: 07/15/22  Patient will be independent with initial HEP.  Goal status: Met  2.   Patient will demonstrate improved 5xSTS < 15s. Baseline: 23.4s Goal status: Met 15 sec   LONG TERM GOALS: Target date: 11/06/22  Patient will be independent with advanced/ongoing HEP to improve outcomes and carryover.  Goal status: Met 10/02/22  2.  Patient will report 75% improvement in low back pain to improve QOL.  Baseline: 7/10 Goal status 08/28/22 progressing,  Progressing 09/23/22 60% 10/21/22  3.  Patient will demonstrate full pain free lumbar ROM to perform ADLs.   Goal status: Progressing 07/31/22  Progressing 08/19/22   08/28/22 progressing, Progressing 09/23/22 Full ROM but some pain and tightness Pain with R lateral flexion and end range extension 10/21/22  4.  Patient will demonstrate improved functional strength as demonstrated by 5/5 in R hip. Baseline: 3+/5 Goal status: 07/21/22 Progressing see above   08/19/22 progressing  08/28/22 progressing, Progressing, 5/5 MET 10/21/22    PLAN:  PT FREQUENCY: 2x/week  PT DURATION: 10 weeks  PLANNED INTERVENTIONS: Therapeutic exercises, Therapeutic activity, Neuromuscular re-education, Balance training, Gait training, Patient/Family education, Self Care, Joint mobilization, Stair training, Dry Needling, Electrical  stimulation, Cryotherapy, Moist heat, Traction, Ionotophoresis 4mg /ml Dexamethasone, and Manual therapy.  PLAN FOR NEXT SESSION: Postural and low back strengthening     Grayce Sessions, PTA 10/23/2022, 11:54 AM

## 2022-10-28 ENCOUNTER — Ambulatory Visit: Payer: 59

## 2022-10-29 NOTE — Therapy (Signed)
OUTPATIENT PHYSICAL THERAPY THORACOLUMBAR TREATMENT    Patient Name: Vanessa Blair MRN: [id number] DOB:1951-01-27, 72 y.o., female Today's Date: 10/30/2022  END OF SESSION:  PT End of Session - 10/30/22 1148     Visit Number 32    Date for PT Re-Evaluation 11/06/22    PT Start Time 1147    PT Stop Time 1230    PT Time Calculation (min) 43 min    Activity Tolerance Patient tolerated treatment well    Behavior During Therapy WFL for tasks assessed/performed                 Past Medical History:  Diagnosis Date   Acid reflux    Anemia    Anxiety    Arthritis    Hiatal hernia    Hypercholesteremia    Hypertension    Past Surgical History:  Procedure Laterality Date   BREAST SURGERY     NASAL SINUS SURGERY     TOTAL HIP ARTHROPLASTY     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   Procedure: TOTAL KNEE ARTHROPLASTY;  Surgeon: Ollen Gross, MD;  Location: [facility];  Service: Orthopedics;  Laterality: Left;   TUBAL LIGATION     Patient Active Problem List   Diagnosis Date Noted   OA (osteoarthritis) of knee 07/18/2019   Primary osteoarthritis of left knee 07/18/2019    PCP: Ardyth Gal  REFERRING PROVIDER: Venita Lick  REFERRING DIAG:  M54.50 (ICD-10-CM) - Low back pain, unspecified      Rationale for Evaluation and Treatment: Rehabilitation  THERAPY DIAG:  Other low back pain  Abnormal posture  Muscle weakness (generalized)  Pain in thoracic spine  Cramp and spasm  ONSET DATE: 05/08/22  SUBJECTIVE:                                                                                                                                                                                           SUBJECTIVE STATEMENT: I am alright, I see the doctor on Wednesday. Not really having band, can feel some in my shoulders.    PERTINENT HISTORY:  Anxiety, arthritis   PAIN:  Are you having pain? Yes: NPRS scale: 2/10 Pain location: mid /upper back and in upper  traps  Pain description: stiff Aggravating factors: lifting heavy objects Relieving factors: meloxicam, Tylenol  PRECAUTIONS: None 5 WEIGHT BEARING RESTRICTIONS: No  FALLS:  Has patient fallen in last 6 months? No  LIVING ENVIRONMENT: Lives with: lives alone Lives in: House/apartment Stairs: No Has following equipment at home: None  OCCUPATION: Retired  PLOF: Independent  PATIENT GOALS: to get rid of pain   OBJECTIVE:  DIAGNOSTIC FINDINGS:  X-rays demonstrate no change in the scoliosis    SCREENING FOR RED FLAGS: Bowel or bladder incontinence: No Spinal tumors: No Cauda equina syndrome: No Compression fracture: No Abdominal aneurysm: No  COGNITION: Overall cognitive status: Within functional limits for tasks assessed     SENSATION: WFL  MUSCLE LENGTH: Hamstrings: mild tightness BLE  POSTURE: rounded shoulders and forward head  PALPATION: Trigger points in bilateral traps and very tight.   LUMBAR ROM:   AROM Eval/ 07/17/22  09/23/22  Flexion Can touch toes, pain coming back up Can touch toes, low back stiffness Can touch toes WFL  Extension 25% with pain WFL some low back stiffness WFL some low back tightness  Right lateral flexion Fib head but pain Fib heas with pain WFL   Left lateral flexion [facility] WFL reports a pull in the low back  Right rotation 50% 25%  Pain WFL  Left rotation 50%  25% limited  WFL   (Blank rows = not tested)  LOWER EXTREMITY ROM:   grossly WFL except R hip flexion is limited to ~105d with pain    LOWER EXTREMITY MMT:    MMT Right eval Left eval Right  07/21/22 RT 08/19/22 RT  Hip flexion 3+ pain in hip 5     Hip extension       Hip abduction 3+ pain 5 4+ 4+ 5/5  Hip adduction     4+  Hip internal rotation       Hip external rotation       Knee flexion 5 5     Knee extension 3+ pain in hip 5 4 4+ 4+  Ankle dorsiflexion       Ankle plantarflexion       Ankle inversion       Ankle eversion        (Blank rows =  not tested)   FUNCTIONAL TESTS:  5 times sit to stand: 23.40s   TODAY'S TREATMENT:                                                                                                                              DATE:  10/30/22 NuStep L5 x40mins  UBE L2 x2 mins each way  Calf raises 2x12 Calf stretch on slant 30s  Shoulder ext 10# 2x10 Cable rows 15# 2x10 Leg ext 10# 2x15  HS curls 25# 2x15 Horizontal abd green 2x10 4# UT stretch 30s each side  3# shoulder flexion w/WaTE 2x10  10/23/22 NuStep L5 x68mins  HS Curls 25lb 2x15 Leg Ext 10lb 2x15 Straight arm pulls 10# 2x15 Standing rows 10lb 2x15 Horizontal abd green 2x10 S2S from standard chair x10, x5 OHP yellow ball 2x10 2# lateral raise 2x10   10/21/22 Recheck goals and recert if needed  NuStep L5 x25mins  Straight arm pulls 10# 2x10 Rows and Lats 20# 2x10 Horizontal abd green 2x10 2# shoulder flexion 2x10 2# lateral raise 2x10 HS Curls  25lb 2x15 Leg Ext 10lb 2x15   10/16/22 UBE L2.5 x 2 min each NuStep L 5 x 6 min HS Curls 25lb 2x15 Leg Ext 10lb 2x15 S2S OHP yellow ball 2x10 Shoulder Ext 5lb 2x12  Row & Lats 20lb 2x10   10/14/22 NuStep L 5 x 5 min Lead PT M. Albright performed DN to UT UBE L 2 x 2 min each HS Curls 25lb 2x12 Leg Ext 10lb 2x12 Sated Rows 25lb 2x10 MHP cervical spine   10/02/22 NuStep L5 x102mins  UBE L 2 x 2 min each HS curls 25lb 2x15 Leg Ext 10lb 2x10 S2S from chair 2x5 Standing OHP yellow wball 2x10 Standing trunk rotations yellow ball  Horiz Abd green 2x10  09/30/22 NuStep L5 x34mins  UBE L2 x 2 min each Shoulder Ext blue 2x12  Row & Lats 25lb 2x12  STM to upper traps and lumbar spine  HS curls 25lb 2x12 Leg Ext 10lb 2x12   PATIENT EDUCATION:  Education details: HEP and POC Person educated: Patient Education method: Explanation Education comprehension: verbalized understanding  HOME EXERCISE PROGRAM: Access Code: [email or web address] URL: [email or web address] Date:  06/10/2022 Prepared by: Cassie Freer  Exercises - Supine Lower Trunk Rotation  - 1 x daily - 7 x weekly - 2 sets - 10 reps - Supine Bridge  - 1 x daily - 7 x weekly - 2 sets - 10 reps - Supine Single Knee to Chest Stretch  - 2 x daily - 7 x weekly - 30 hold - Doorway Pec Stretch at 90 Degrees Abduction  - 2 x daily - 7 x weekly - 30 hold - Seated March with Resistance  - 1 x daily - 7 x weekly - 2 sets - 10 reps  ASSESSMENT:  CLINICAL IMPRESSION:  patient continues to have some pain in upper and lower back. Continued with some postural exercises to reinforce strength and awareness. She has forward head and rounded shoulders at rest. Will be seeing her doctor on Wednesday for a follow up with her back. She reports the therapy is helping make her feel less tight and stiff.  OBJECTIVE IMPAIRMENTS: difficulty walking, decreased ROM, decreased strength, and pain.   REHAB POTENTIAL: Good  CLINICAL DECISION MAKING: Stable/uncomplicated  EVALUATION COMPLEXITY: Low   GOALS: Goals reviewed with patient? Yes  SHORT TERM GOALS: Target date: 07/15/22  Patient will be independent with initial HEP.  Goal status: Met  2.   Patient will demonstrate improved 5xSTS < 15s. Baseline: 23.4s Goal status: Met 15 sec   LONG TERM GOALS: Target date: 11/06/22  Patient will be independent with advanced/ongoing HEP to improve outcomes and carryover.  Goal status: Met 10/02/22  2.  Patient will report 75% improvement in low back pain to improve QOL.  Baseline: 7/10 Goal status 08/28/22 progressing,  Progressing 09/23/22 60% 10/21/22  3.  Patient will demonstrate full pain free lumbar ROM to perform ADLs.   Goal status: Progressing 07/31/22  Progressing 08/19/22   08/28/22 progressing, Progressing 09/23/22 Full ROM but some pain and tightness Pain with R lateral flexion and end range extension 10/21/22  4.  Patient will demonstrate improved functional strength as demonstrated by 5/5 in R hip. Baseline:  3+/5 Goal status: 07/21/22 Progressing see above   08/19/22 progressing  08/28/22 progressing, Progressing, 5/5 MET 10/21/22    PLAN:  PT FREQUENCY: 2x/week  PT DURATION: 10 weeks  PLANNED INTERVENTIONS: Therapeutic exercises, Therapeutic activity, Neuromuscular re-education, Balance training, Gait training, Patient/Family education, Self Care, Joint mobilization, Stair training,  Dry Needling, Electrical stimulation, Cryotherapy, Moist heat, Traction, Ionotophoresis 4mg /ml Dexamethasone, and Manual therapy.  PLAN FOR NEXT SESSION: Postural and low back strengthening     Cassie Freer, PT 10/30/2022, 12:26 PM

## 2022-10-30 ENCOUNTER — Ambulatory Visit: Payer: 59

## 2022-10-30 DIAGNOSIS — M6281 Muscle weakness (generalized): Secondary | ICD-10-CM

## 2022-10-30 DIAGNOSIS — R252 Cramp and spasm: Secondary | ICD-10-CM

## 2022-10-30 DIAGNOSIS — M546 Pain in thoracic spine: Secondary | ICD-10-CM

## 2022-10-30 DIAGNOSIS — M5459 Other low back pain: Secondary | ICD-10-CM

## 2022-10-30 DIAGNOSIS — R293 Abnormal posture: Secondary | ICD-10-CM

## 2022-11-04 ENCOUNTER — Ambulatory Visit: Payer: 59 | Admitting: Physical Therapy

## 2022-11-06 ENCOUNTER — Ambulatory Visit: Payer: 59 | Attending: Orthopedic Surgery

## 2022-11-06 DIAGNOSIS — M5459 Other low back pain: Secondary | ICD-10-CM | POA: Insufficient documentation

## 2022-11-06 DIAGNOSIS — R293 Abnormal posture: Secondary | ICD-10-CM | POA: Insufficient documentation

## 2022-11-17 ENCOUNTER — Ambulatory Visit: Payer: 59

## 2022-12-02 ENCOUNTER — Ambulatory Visit: Payer: 59 | Admitting: Physical Therapy

## 2022-12-02 ENCOUNTER — Other Ambulatory Visit: Payer: Self-pay

## 2022-12-02 ENCOUNTER — Encounter: Payer: Self-pay | Admitting: Physical Therapy

## 2022-12-02 DIAGNOSIS — M5459 Other low back pain: Secondary | ICD-10-CM | POA: Diagnosis present

## 2022-12-02 DIAGNOSIS — R293 Abnormal posture: Secondary | ICD-10-CM

## 2022-12-02 NOTE — Therapy (Signed)
OUTPATIENT PHYSICAL THERAPY THORACOLUMBAR EVALUATION   Patient Name: Vanessa Blair MRN: [id number] DOB:07-Feb-1951, 72 y.o., female Today's Date: 12/02/2022  END OF SESSION:  PT End of Session - 12/02/22 1518     Visit Number 1    Number of Visits 3    Date for PT Re-Evaluation 12/23/22    Authorization Type U[employer]    Authorization Time Period 12/02/22 to 12/23/22    PT Start Time 1516    PT Stop Time 1602    PT Time Calculation (min) 46 min    Activity Tolerance Patient tolerated treatment well    Behavior During Therapy WFL for tasks assessed/performed             Past Medical History:  Diagnosis Date   Acid reflux    Anemia    Anxiety    Arthritis    Hiatal hernia    Hypercholesteremia    Hypertension    Past Surgical History:  Procedure Laterality Date   BREAST SURGERY     NASAL SINUS SURGERY     TOTAL HIP ARTHROPLASTY     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   Procedure: TOTAL KNEE ARTHROPLASTY;  Surgeon: Ollen Gross, MD;  Location: [facility];  Service: Orthopedics;  Laterality: Left;   TUBAL LIGATION     Patient Active Problem List   Diagnosis Date Noted   OA (osteoarthritis) of knee 07/18/2019   Primary osteoarthritis of left knee 07/18/2019    PCP: Ardyth Gal MD   REFERRING PROVIDER: Venita Lick, MD  REFERRING DIAG: M41.9 (ICD-10-CM) - Scoliosis, unspecified  Rationale for Evaluation and Treatment: Rehabilitation  THERAPY DIAG:  Other low back pain  Abnormal posture  ONSET DATE: chronic   SUBJECTIVE:                                                                                                                                                                                           SUBJECTIVE STATEMENT:  I just found out that I have scoliosis from the first of the year, I could tell yesterday that I hadn't been to PT for the past 2-3 weeks. It was so terrible to turn my head yesterday. I was doing some rubber band and my uncle and I  walked some and I felt better. They gave some dry needling last time and it lasted for a long time. I saw my back doctor the first of the month and he told me that we will send me back to PT, I don't want the shot in my back unless I absolutely have to have it, the MD wanted me to come back. I've  only been doing the rubber bands at home. I do not want to go to a fitness center, I found one where I can go to free but since Covid I do not want to be around people. My insurance will pay for me to go free but I don't want to.   PERTINENT HISTORY:  Anemia, anxiety, OA, hernia, HLD, HTN, breast surgery, THA, TKA  PAIN:  Are you having pain? Yes: NPRS scale: 6/10 Pain location: neck down to low back  Pain description: discomfort, stiffness, tightness  Aggravating factors: waking up in the mornings, not being active  Relieving factors: medicine, dry needling in PT   Can get to 9/10 at worst, relieved by medications   PRECAUTIONS: None  RED FLAGS: None   WEIGHT BEARING RESTRICTIONS: No  FALLS:  Has patient fallen in last 6 months? 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 Shon Baton PRN   OBJECTIVE:     PATIENT SURVEYS:  FOTO 2nd session   SCREENING FOR RED FLAGS: Bowel or bladder incontinence: No Spinal tumors: No Cauda equina syndrome: No Compression fracture: No Abdominal aneurysm: No  COGNITION: Overall cognitive status: Within functional limits for tasks assessed     SENSATION: Not tested    POSTURE: rounded shoulders, forward head, increased lumbar lordosis, increased thoracic kyphosis, and scoliosis with L concavity in thoracic spine    PALPATION: Upper traps TTP B, some tenderness in L lat as well   LUMBAR ROM:   AROM eval  Flexion WNL  Extension WNL  Right lateral flexion WNL soreness   Left lateral flexion WNL soreness   Right rotation   Left rotation   Cervical flexion  21*  Cervical extension  26*  Cervical lateral flexion L/R  20*/20*  Cervical rotation L/R  Mild limitation B    (Blank rows = not tested)    LOWER EXTREMITY MMT:    MMT Right eval Left eval  Hip flexion 3 3  Hip extension    Hip abduction 3+ seated  3+ seated   Hip adduction    Hip internal rotation    Hip external rotation    Knee flexion    Knee extension 4+ 4+  Ankle dorsiflexion 5 5  Ankle plantarflexion    Ankle inversion    Ankle eversion    Shoulder flexion 4 4  Shoulder ABD  4 4  Elbow flexion 4 4  Elbow extension  4 4   (Blank rows = not tested)      TODAY'S TREATMENT:  DATE:   12/02/22- eval and lots of education (see below), manual B upper traps    PATIENT EDUCATION:  Education details: Exam findings, POC, possible insurance limitations given that she has already had 32 visits of PT this year with limited progress, benefits of water PT, offered independent gym membership here at this clinic, discussed differences in billing/insurance visits between different types of PT clinics  Person educated: Patient Education method: Explanation Education comprehension: verbalized understanding and needs further education  HOME EXERCISE PROGRAM: None   ASSESSMENT:  CLINICAL IMPRESSION: Patient is a 72 y.o. F who was seen today for physical therapy evaluation and treatment for M41.9 (ICD-10-CM) - Scoliosis, unspecified. Of note she is very well known to this clinic and recently finished a long course of skilled PT care with last visit (#32) on 10/30/22. Had extensive discussion with PTs/PTAs  on prior care team about functional outcomes last course of care and interventions performed as well. At this point she has already received extensive skilled PT services at this clinic already this year (32 visits over almost 5 months) with limited functional objective gains and has not been compliant with activity recommendations on her own at home. Refused water physical therapy at [facility] facility as well as independent exercise program option at this clinic. At this time she seems to have really maximized benefit from skilled OP PT services- may see her 1-2 more times to review home program and encourage independent activities prior to hard DC.   OBJECTIVE IMPAIRMENTS: decreased ROM, decreased strength, postural dysfunction, and pain.   ACTIVITY LIMITATIONS: carrying, lifting, sleeping, transfers, bed mobility, reach over head, and hygiene/grooming  PARTICIPATION LIMITATIONS: meal prep, cleaning, laundry, and community activity  PERSONAL FACTORS: Age, Behavior pattern, Education, Fitness, Past/current experiences, Social background, and Time since onset of injury/illness/exacerbation are also affecting patient's functional outcome.   REHAB POTENTIAL: Poor maximized benefit from skilled PT services during last skilled course of care   CLINICAL DECISION MAKING: Stable/uncomplicated  EVALUATION COMPLEXITY: Low   GOALS: Goals reviewed with patient? Yes  SHORT TERM GOALS: Target date: 12/23/2022    Will demonstrate good understanding and performance of thorough and extensive HEP for independent use  Baseline: Goal status: INITIAL  2.  Will demonstrate good biomechanics for floor to waist lifting and for bed mechanics to prevent exacerbation of pain Baseline:  Goal status: INITIAL  3.  Will verbalize knowledge of community resources for ongoing fitness and exercise after DC from PT  Baseline:  Goal status: INITIAL    LONG TERM GOALS: Target date: STGs =LTGs     PLAN:  PT  FREQUENCY:  no more than 2 additional visits   PT DURATION: 3 weeks  PLANNED INTERVENTIONS: Therapeutic exercises, Therapeutic activity, Self Care, Cryotherapy, Moist heat, and Manual therapy.  PLAN FOR NEXT SESSION: review appropriate exercises for home and gym use, hard DC after 2 more visits max   Nedra Hai, PT, DPT 12/02/22 4:58 PM

## 2022-12-17 ENCOUNTER — Encounter: Payer: Self-pay | Admitting: Physical Therapy

## 2022-12-17 ENCOUNTER — Ambulatory Visit: Payer: 59 | Attending: Orthopedic Surgery | Admitting: Physical Therapy

## 2022-12-17 DIAGNOSIS — R293 Abnormal posture: Secondary | ICD-10-CM | POA: Insufficient documentation

## 2022-12-17 DIAGNOSIS — M6281 Muscle weakness (generalized): Secondary | ICD-10-CM | POA: Diagnosis present

## 2022-12-17 DIAGNOSIS — M546 Pain in thoracic spine: Secondary | ICD-10-CM | POA: Diagnosis present

## 2022-12-17 DIAGNOSIS — M5459 Other low back pain: Secondary | ICD-10-CM | POA: Diagnosis present

## 2022-12-17 DIAGNOSIS — R252 Cramp and spasm: Secondary | ICD-10-CM | POA: Insufficient documentation

## 2022-12-17 NOTE — Therapy (Signed)
OUTPATIENT PHYSICAL THERAPY THORACOLUMBAR TREATMENT   Patient Name: Vanessa Blair MRN: [id number] DOB:Mar 15, 1951, 72 y.o., female Today's Date: 12/17/2022  END OF SESSION:  PT End of Session - 12/17/22 1302     Visit Number 2    Number of Visits 3    Date for PT Re-Evaluation 12/23/22    Authorization Type U[employer]    Authorization Time Period 12/02/22 to 12/23/22    PT Start Time 1302    PT Stop Time 1340    PT Time Calculation (min) 38 min    Activity Tolerance Patient tolerated treatment well    Behavior During Therapy WFL for tasks assessed/performed              Past Medical History:  Diagnosis Date   Acid reflux    Anemia    Anxiety    Arthritis    Hiatal hernia    Hypercholesteremia    Hypertension    Past Surgical History:  Procedure Laterality Date   BREAST SURGERY     NASAL SINUS SURGERY     TOTAL HIP ARTHROPLASTY     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   Procedure: TOTAL KNEE ARTHROPLASTY;  Surgeon: Ollen Gross, MD;  Location: [facility];  Service: Orthopedics;  Laterality: Left;   TUBAL LIGATION     Patient Active Problem List   Diagnosis Date Noted   OA (osteoarthritis) of knee 07/18/2019   Primary osteoarthritis of left knee 07/18/2019    PCP: Ardyth Gal MD   REFERRING PROVIDER: Venita Lick, MD  REFERRING DIAG: M41.9 (ICD-10-CM) - Scoliosis, unspecified  Rationale for Evaluation and Treatment: Rehabilitation  THERAPY DIAG:  Other low back pain  Abnormal posture  Muscle weakness (generalized)  ONSET DATE: chronic   SUBJECTIVE:                                                                                                                                                                                           SUBJECTIVE STATEMENT:  My arm is a little sore after getting the flu shots yesterday at my physical. Upper and lower back are still hurting me. I have osteoporosis in my low back and  I'm going to see a osteoporosis  doctor, my other doctor that sent me here really wants me to come to PT. I got the letter from my insurance saying I got the 2 visits I think after that I would like to do the independent gym program you have here because that worked well for me before.   PERTINENT HISTORY:  Anemia, anxiety, OA, hernia, HLD, HTN, breast surgery, THA, TKA  PAIN:  Are you  having pain? Yes: NPRS scale: 6/10 Pain location: neck down to low back  Pain description: discomfort, stiffness, tightness  Aggravating factors: waking up in the mornings, not being active  Relieving factors: medicine, dry needling in PT   Can get to 9/10 at worst, relieved by medications   PRECAUTIONS: None  RED FLAGS: None   WEIGHT BEARING RESTRICTIONS: No  FALLS:  Has patient fallen in last 6 months? 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 Shon Baton PRN   OBJECTIVE:     PATIENT SURVEYS:  FOTO 2nd session   SCREENING FOR RED FLAGS: Bowel or bladder incontinence: No Spinal tumors: No Cauda equina syndrome: No Compression fracture: No Abdominal aneurysm: No  COGNITION: Overall cognitive status: Within functional limits for tasks assessed     SENSATION: Not tested    POSTURE: rounded shoulders, forward head, increased lumbar lordosis, increased thoracic kyphosis, and scoliosis with L concavity in thoracic spine   PALPATION: Upper traps TTP B, some tenderness in L lat as well   LUMBAR ROM:   AROM eval  Flexion WNL  Extension WNL  Right lateral flexion WNL soreness   Left lateral flexion WNL soreness   Right rotation   Left rotation   Cervical flexion  21*  Cervical extension   26*  Cervical lateral flexion L/R  20*/20*  Cervical rotation L/R  Mild limitation B    (Blank rows = not tested)    LOWER EXTREMITY MMT:    MMT Right eval Left eval  Hip flexion 3 3  Hip extension    Hip abduction 3+ seated  3+ seated   Hip adduction    Hip internal rotation    Hip external rotation    Knee flexion    Knee extension 4+ 4+  Ankle dorsiflexion 5 5  Ankle plantarflexion    Ankle inversion    Ankle eversion    Shoulder flexion 4 4  Shoulder ABD  4 4  Elbow flexion 4 4  Elbow extension  4 4   (Blank rows = not tested)      TODAY'S TREATMENT:                                                                                                                              DATE:    12/17/22  TherEX  UBE L3.5 x3 min forward/3 min backwards Machine rows 15# 2x10 Lat pulls 15# 2x10 Corner stretch 3x30 seconds  Machine shoulder extensions 15# 2x10 Tricep extensions 10# 2x10   Manual  STM B upper traps, cervical paraspinals, rhomboids, suboccipitals     12/02/22- eval and lots of education (see below), manual B upper  traps    PATIENT EDUCATION:  Education details: encouraged transition to independent exercise program after DC next visit  Person educated: Patient Education method: Explanation Education comprehension: verbalized understanding and needs further education  HOME EXERCISE PROGRAM:  Access Code: [email or web address] URL: [email or web address] Date: 12/17/2022 Prepared by: Nedra Hai  Exercises - Seated Row Cable Machine  - 1 x daily - 7 x weekly - 3 sets - 10 reps - Lat Pull Down  - 1 x daily - 7 x weekly - 3 sets - 10 reps - Triceps Extension   - 1 x daily - 7 x weekly - 3 sets - 10 reps - Shoulder extension with resistance - Neutral  - 1 x daily - 7 x weekly - 3 sets - 10 reps - Seated Arm Bike  - 1 x daily - 7 x weekly - 3 sets - 10 reps  ASSESSMENT:  CLINICAL IMPRESSION:   Vanessa Blair arrives today doing OK, aware that  her insurance approved 2 visits. She is in agreement that we will complete these two visits and then DC to this clinic's independent gym program. Worked on functional upper body strengthening and mobility to review appropriate exercises and promote independence with machines available to her in this clinic. Will plan for an additional session to finalize machine and biomechanics training, followed by DC to independent exercise programming.   OBJECTIVE IMPAIRMENTS: decreased ROM, decreased strength, postural dysfunction, and pain.   ACTIVITY LIMITATIONS: carrying, lifting, sleeping, transfers, bed mobility, reach over head, and hygiene/grooming  PARTICIPATION LIMITATIONS: meal prep, cleaning, laundry, and community activity  PERSONAL FACTORS: Age, Behavior pattern, Education, Fitness, Past/current experiences, Social background, and Time since onset of injury/illness/exacerbation are also affecting patient's functional outcome.   REHAB POTENTIAL: Poor maximized benefit from skilled PT services during last skilled course of care   CLINICAL DECISION MAKING: Stable/uncomplicated  EVALUATION COMPLEXITY: Low   GOALS: Goals reviewed with patient? Yes  SHORT TERM GOALS: Target date: 12/23/2022    Will demonstrate good understanding and performance of thorough and extensive HEP for independent use  Baseline: Goal status: INITIAL  2.  Will demonstrate good biomechanics for floor to waist lifting and for bed mechanics to prevent exacerbation of pain Baseline:  Goal status: INITIAL  3.  Will verbalize knowledge of community resources for ongoing fitness and exercise after DC from PT  Baseline:  Goal status: INITIAL    LONG TERM GOALS: Target date: STGs =LTGs     PLAN:  PT FREQUENCY:  no more than 2 additional visits   PT DURATION: 3 weeks  PLANNED INTERVENTIONS: Therapeutic exercises, Therapeutic activity, Self Care, Cryotherapy, Moist heat, and Manual therapy.  PLAN FOR NEXT  SESSION: review machines and components of independent exercise programming, manual and dry needling if pt desires, hard DC to independent exercise program in clinic/had 2 approved visits only from insurance   Nedra Hai, PT, DPT 12/17/22 1:43 PM

## 2022-12-22 ENCOUNTER — Ambulatory Visit: Payer: 59

## 2022-12-22 ENCOUNTER — Other Ambulatory Visit: Payer: Self-pay

## 2022-12-22 DIAGNOSIS — M5459 Other low back pain: Secondary | ICD-10-CM

## 2022-12-22 DIAGNOSIS — R252 Cramp and spasm: Secondary | ICD-10-CM

## 2022-12-22 DIAGNOSIS — R293 Abnormal posture: Secondary | ICD-10-CM

## 2022-12-22 DIAGNOSIS — M546 Pain in thoracic spine: Secondary | ICD-10-CM

## 2022-12-22 DIAGNOSIS — M6281 Muscle weakness (generalized): Secondary | ICD-10-CM

## 2022-12-22 NOTE — Therapy (Signed)
OUTPATIENT PHYSICAL THERAPY THORACOLUMBAR DISCHARGE    Patient Name: Vanessa Blair MRN: [id number] DOB:1950/12/23, 72 y.o., female Today's Date: 12/22/2022  END OF SESSION:  PT End of Session - 12/22/22 1351     Visit Number 3    Date for PT Re-Evaluation 12/23/22    PT Start Time 1351    PT Stop Time 1430    PT Time Calculation (min) 39 min    Activity Tolerance Patient tolerated treatment well    Behavior During Therapy WFL for tasks assessed/performed               Past Medical History:  Diagnosis Date   Acid reflux    Anemia    Anxiety    Arthritis    Hiatal hernia    Hypercholesteremia    Hypertension    Past Surgical History:  Procedure Laterality Date   BREAST SURGERY     NASAL SINUS SURGERY     TOTAL HIP ARTHROPLASTY     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   Procedure: TOTAL KNEE ARTHROPLASTY;  Surgeon: Ollen Gross, MD;  Location: [facility];  Service: Orthopedics;  Laterality: Left;   TUBAL LIGATION     Patient Active Problem List   Diagnosis Date Noted   OA (osteoarthritis) of knee 07/18/2019   Primary osteoarthritis of left knee 07/18/2019    PCP: Ardyth Gal MD   REFERRING PROVIDER: Venita Lick, MD  REFERRING DIAG: M41.9 (ICD-10-CM) - Scoliosis, unspecified  Rationale for Evaluation and Treatment: Rehabilitation  THERAPY DIAG:  Other low back pain  Abnormal posture  Muscle weakness (generalized)  Pain in thoracic spine  Cramp and spasm  ONSET DATE: chronic   SUBJECTIVE:                                                                                                                                                                                           SUBJECTIVE STATEMENT:12/23/22:  I was sore from the dry needling the other day, but it made me feel better.  I might want to do it again today.  I am ready to transition to the independent program after today.  My arm is a little sore after getting the flu shots yesterday at  my physical. Upper and lower back are still hurting me. I have osteoporosis in my low back and  I'm going to see a osteoporosis doctor, my other doctor that sent me here really wants me to come to PT. I got the letter from my insurance saying I got the 2 visits I think after that I would like to do the independent gym program you have here  because that worked well for me before.   PERTINENT HISTORY:  Anemia, anxiety, OA, hernia, HLD, HTN, breast surgery, THA, TKA  PAIN:  Are you having pain? Yes: NPRS scale: 6/10 Pain location: neck down to low back  Pain description: discomfort, stiffness, tightness  Aggravating factors: waking up in the mornings, not being active  Relieving factors: medicine, dry needling in PT   Can get to 9/10 at worst, relieved by medications   PRECAUTIONS: None  RED FLAGS: None   WEIGHT BEARING RESTRICTIONS: No  FALLS:  Has patient fallen in last 6 months? 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 Shon Baton PRN   OBJECTIVE:     PATIENT SURVEYS:  FOTO 2nd session   SCREENING FOR RED FLAGS: Bowel or bladder incontinence: No Spinal tumors: No Cauda equina syndrome: No Compression fracture: No Abdominal aneurysm: No  COGNITION: Overall cognitive status: Within functional limits for tasks assessed     SENSATION: Not tested    POSTURE: rounded shoulders, forward head, increased lumbar lordosis, increased thoracic kyphosis, and scoliosis with L concavity in thoracic spine   PALPATION: Upper traps TTP B, some tenderness in L lat as well   LUMBAR ROM:   AROM eval  Flexion WNL  Extension WNL  Right lateral flexion WNL  soreness   Left lateral flexion WNL soreness   Right rotation   Left rotation   Cervical flexion  21*  Cervical extension  26*  Cervical lateral flexion L/R  20*/20*  Cervical rotation L/R  Mild limitation B    (Blank rows = not tested)    LOWER EXTREMITY MMT:    MMT Right eval Left eval  Hip flexion 3 3  Hip extension    Hip abduction 3+ seated  3+ seated   Hip adduction    Hip internal rotation    Hip external rotation    Knee flexion    Knee extension 4+ 4+  Ankle dorsiflexion 5 5  Ankle plantarflexion    Ankle inversion    Ankle eversion    Shoulder flexion 4 4  Shoulder ABD  4 4  Elbow flexion 4 4  Elbow extension  4 4   (Blank rows = not tested)      TODAY'S TREATMENT:                                                                                                                              DATE:   12/22/22: Nustep level 5 UE's and LE's 6 min Seated rows 15#, 2 x 10 Seated lat pulls, 15# 2 x 10 Knee extensions 10#, 2 x 10 Knee flexion B 25# 2 x  12  shoulder extensions 10# 2x10  Trigger Point Dry-Needling  Treatment instructions: Expect mild to moderate muscle soreness. S/S of pneumothorax if dry needled over a lung field, and to seek immediate medical attention should they occur. Patient verbalized understanding of these instructions and education. Patient Consent Given: Yes Education handout provided: No Muscles treated: B upper traps, 2 pts each Treatment response/outcome: Palpable Increase in Muscle Length    12/17/22  TherEX  UBE L3.5 x3 min forward/3 min backwards Machine rows 15# 2x10 Lat pulls 15# 2x10 Corner stretch 3x30 seconds  Machine shoulder extensions 15# 2x10 Tricep extensions 10# 2x10   Manual  STM B upper traps, cervical paraspinals, rhomboids, suboccipitals     12/02/22- eval and lots of education (see below), manual B upper traps    PATIENT EDUCATION:  Education details: encouraged transition to independent  exercise program after DC next visit  Person educated: Patient Education method: Explanation Education comprehension: verbalized understanding and needs further education  HOME EXERCISE PROGRAM:  Access Code: [email or web address] URL: [email or web address] Date: 12/17/2022 Prepared by: Nedra Hai  Exercises - Seated Row Cable Machine  - 1 x daily - 7 x weekly - 3 sets - 10 reps - Lat Pull Down  - 1 x daily - 7 x weekly - 3 sets - 10 reps - Triceps Extension   - 1 x daily - 7 x weekly - 3 sets - 10 reps - Shoulder extension with resistance - Neutral  - 1 x daily - 7 x weekly - 3 sets - 10 reps - Seated Arm Bike  - 1 x daily - 7 x weekly - 3 sets - 10 reps  ASSESSMENT:  CLINICAL IMPRESSION:  The patient has completed skilled physical therapy to address her lower back pain.  She has recovered well. She has determined that she will follow up with the independent program here.  Today we reviewed her therex and made sure that she know how to position herself in the equipment, etc.  She has met her goals at this time.   DC today from formal skilled PT, to independent exercise programming.   OBJECTIVE IMPAIRMENTS: decreased ROM, decreased strength, postural dysfunction, and pain.   ACTIVITY LIMITATIONS: carrying, lifting, sleeping, transfers, bed mobility, reach over head, and hygiene/grooming  PARTICIPATION LIMITATIONS: meal prep, cleaning, laundry, and community activity  PERSONAL FACTORS: Age, Behavior pattern, Education, Fitness, Past/current experiences, Social background, and Time since onset of injury/illness/exacerbation are also affecting patient's functional outcome.   REHAB POTENTIAL: Poor maximized benefit from skilled PT services during last skilled course of care   CLINICAL DECISION MAKING: Stable/uncomplicated  EVALUATION COMPLEXITY: Low   GOALS: Goals reviewed with patient? Yes  SHORT TERM GOALS: Target date: 12/23/2022    Will demonstrate good understanding  and performance of thorough and extensive HEP for independent use  Baseline: Goal status: 12/23/22 met  2.  Will demonstrate good biomechanics for floor to waist lifting and for bed mechanics to prevent exacerbation of pain Baseline:  Goal status: 12/23/22  3.  Will verbalize knowledge of community resources for ongoing fitness and exercise after DC from PT  Baseline:  Goal status: 12/23/22: met     LONG TERM GOALS: Target date: STGs =LTGs     PLAN:  PT FREQUENCY:  no more than 2 additional visits   PT DURATION: 3 weeks  PLANNED INTERVENTIONS: Therapeutic exercises, Therapeutic activity, Self Care, Cryotherapy, Moist heat, and Manual therapy.  PLAN FOR NEXT SESSION: DC to independent exercise program in clinic/had 2  approved visits only from insurance   Jazz Biddy, [place], DPT, OCS 12/22/22 3:14 PM

## 2022-12-24 ENCOUNTER — Ambulatory Visit: Payer: 59 | Admitting: Physical Therapy

## 2023-05-12 ENCOUNTER — Other Ambulatory Visit: Payer: Self-pay

## 2023-05-12 ENCOUNTER — Ambulatory Visit: Payer: 59 | Attending: Orthopedic Surgery

## 2023-05-12 DIAGNOSIS — M546 Pain in thoracic spine: Secondary | ICD-10-CM | POA: Diagnosis present

## 2023-05-12 DIAGNOSIS — M6281 Muscle weakness (generalized): Secondary | ICD-10-CM

## 2023-05-12 DIAGNOSIS — M542 Cervicalgia: Secondary | ICD-10-CM

## 2023-05-12 DIAGNOSIS — M4122 Other idiopathic scoliosis, cervical region: Secondary | ICD-10-CM

## 2023-05-12 DIAGNOSIS — M5459 Other low back pain: Secondary | ICD-10-CM | POA: Diagnosis present

## 2023-05-12 DIAGNOSIS — R293 Abnormal posture: Secondary | ICD-10-CM | POA: Diagnosis present

## 2023-05-12 NOTE — Therapy (Addendum)
 OUTPATIENT PHYSICAL THERAPY THORACOLUMBAR EVALUATION   Patient Name: Vanessa Blair MRN: [id number] DOB:December 07, 1950, 73 y.o., female Today's Date: 05/12/2023  END OF SESSION:   Past Medical History:  Diagnosis Date   Acid reflux    Anemia    Anxiety    Arthritis    Hiatal hernia    Hypercholesteremia    Hypertension    Past Surgical History:  Procedure Laterality Date   BREAST SURGERY     NASAL SINUS SURGERY     TOTAL HIP ARTHROPLASTY     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   Procedure: TOTAL KNEE ARTHROPLASTY;  Surgeon: Melodi Lerner, MD;  Location: [facility];  Service: Orthopedics;  Laterality: Left;   TUBAL LIGATION     Patient Active Problem List   Diagnosis Date Noted   OA (osteoarthritis) of knee 07/18/2019   Primary osteoarthritis of left knee 07/18/2019    PCP: Dr. Slater Sole  REFERRING PROVIDER:  Dr. Donaciano Sprang  REFERRING DIAG: Scoliosis  Rationale for Evaluation and Treatment: Rehabilitation  THERAPY DIAG:  No diagnosis found.  ONSET DATE: not specified  SUBJECTIVE:                                                                                                                                                                                           SUBJECTIVE STATEMENT: Pt stated her back has been bothering her and her Dr said she has scoliosis. She mentioned getting shots for osteoporosis. Mentioned hurting all the time, sometimes not as bad.   PERTINENT HISTORY:  L TKA (07/27/2019) THA OA Osteoporosis Hypertension  PAIN:  Are you having pain? Yes: NPRS scale: 7/10 Pain location: mainly in the shoulders Pain description: dull Aggravating factors: lifting heavy objects (groceries) Relieving factors: biofreeze  PRECAUTIONS: None  RED FLAGS: None   WEIGHT BEARING RESTRICTIONS: No  FALLS:  Has patient fallen in last 6 months? No  LIVING ENVIRONMENT: Lives with: lives alone Lives in: House/apartment Stairs: No Has following  equipment at home: None  OCCUPATION: retired  PLOF: Independent  PATIENT GOALS: cleaning with no pain (mopping), lifting 24 pack of water   NEXT MD VISIT: none scheduled  OBJECTIVE:  Note: Objective measures were completed at Evaluation unless otherwise noted.  DIAGNOSTIC FINDINGS:  none  COGNITION: Overall cognitive status: Within functional limits for tasks assessed     SENSATION: WFL  POSTURE: rounded shoulders, forward head, decreased lumbar lordosis, and increased thoracic kyphosis  PALPATION: CPAs: C7-T3 were hypomobile, painful; only C7 got better with repeated motions  LUMBAR ROM:   AROM eval  Flexion 100%*  Extension 50%*  Right lateral flexion 50%*  Left  lateral flexion 50%*  Right rotation 50%*  Left rotation 50%*   (Blank rows = not tested, * = pain)  LOWER EXTREMITY ROM:   grossly WFL BLE  Cervical ROM: Grossly WFL, pain with right side bend (pain felt on left side), left rotation, flexion and extension   LOWER EXTREMITY MMT:    MMT Right eval Left eval  Hip flexion 4-/5 4-/5  Hip extension    Hip abduction 4 4  Hip adduction 4- 4-  Hip internal rotation    Hip external rotation    Knee flexion    Knee extension    Ankle dorsiflexion    Ankle plantarflexion    Ankle inversion    Ankle eversion     (Blank rows = not tested)  FUNCTIONAL TESTS:  5 times sit to stand: 20.67 Timed up and go (TUG): 12.04  GAIT: Distance walked: in clinic distances Assistive device utilized: None Level of assistance: Complete Independence Comments: knee valgus   TREATMENT DATE: 05/12/23 EVAL                                                                                                                                  PATIENT EDUCATION:  Education details: POC and HEP Person educated: Patient Education method: Explanation Education comprehension: verbalized understanding and returned demonstration  HOME EXERCISE PROGRAM: Access Code: [email or web address] URL:  [email or web address] Date: 05/12/2023 Prepared by: Almetta Fam Exercises - Seated March  - 1 x daily - 7 x weekly - 2 sets - 10 reps - Seated Hip Abduction with Resistance  - 1 x daily - 7 x weekly - 2 sets - 10 reps - Seated Cervical Sidebending Stretch  - 1 x daily - 7 x weekly - 2 sets - 10 reps - 10 hold - Seated Cervical Flexion Stretch with Finger Support Behind Neck  - 1 x daily - 7 x weekly - 2 sets - 10 reps - 10 hold   ASSESSMENT:  CLINICAL IMPRESSION: Patient is a 73 y.o. female who was seen today for physical therapy evaluation and treatment for scoliosis. Pt reported high pain mainly in her upper traps and neck area. She had some weakness in her hip flexors with some limited mobility in her low back. Her cervical ROM was grossly WFL, but she had pain with a few motions. Pt will benefit from skilled PT to work on posture, decrease pain, and address weakness in her hip flexors.   OBJECTIVE IMPAIRMENTS: Abnormal gait, difficulty walking, decreased ROM, decreased strength, and pain.   ACTIVITY LIMITATIONS: carrying, lifting, bending, sitting, standing, and stairs  PARTICIPATION LIMITATIONS: cleaning, laundry, shopping, community activity, and yard work  PERSONAL FACTORS: Age, Behavior pattern, and Past/current experiences are also affecting patient's functional outcome.   REHAB POTENTIAL: Good  CLINICAL DECISION MAKING: Stable/uncomplicated  EVALUATION COMPLEXITY: Low  GOALS: Goals reviewed with patient? No  SHORT TERM GOALS: Target date: 05/26/23  Patient will be independent with  initial HEP.  Baseline: given 05/12/23 Goal status: INITIAL  LONG TERM GOALS: Target date: 06/09/23  Patient will be independent with advanced/ongoing HEP to improve outcomes and carryover.  Baseline:  Goal status: INITIAL  2.  Patient will report 50-75% improvement in neck/shoulder pain to improve QOL.  Baseline: 7/10 (05/12/23) Goal status: INITIAL  3.  Patient will demonstrate  full pain free lumbar ROM to perform ADLs.   Baseline: see chart above Goal status: INITIAL  4.  Patient will be able to carry a case of water  and groceries into the house without pain  Baseline: unable to (05/12/23) Goal status: INITIAL  5.  Patient will be able to mop house without reporting pain. Baseline: reports pain with mopping Goal status: INITIAL  6.  Patient to demonstrate ability to achieve and maintain good spinal alignment/posturing and body mechanics needed for daily activities. Baseline: rounded shoulders, forward head, increased thoracic kyphosis, scoliosis Goal status: INITIAL  PLAN:  PT FREQUENCY: 1x/week  PT DURATION: 4 weeks  PLANNED INTERVENTIONS: 97110-Therapeutic exercises, 97530- Therapeutic activity, 97112- Neuromuscular re-education, 97535- Self Care, [id number]- Manual therapy, Dry Needling, Cryotherapy, and Moist heat.  PLAN FOR NEXT SESSION: neck stretching to address, dry needling to neck, address posture   Larraine Nordmann, Student-PT 05/12/2023, 9:50 AM

## 2023-05-19 ENCOUNTER — Encounter: Payer: Self-pay | Admitting: Physical Therapy

## 2023-05-19 ENCOUNTER — Ambulatory Visit: Payer: 59 | Admitting: Physical Therapy

## 2023-05-19 DIAGNOSIS — M4122 Other idiopathic scoliosis, cervical region: Secondary | ICD-10-CM | POA: Diagnosis not present

## 2023-05-19 DIAGNOSIS — R293 Abnormal posture: Secondary | ICD-10-CM

## 2023-05-19 DIAGNOSIS — M546 Pain in thoracic spine: Secondary | ICD-10-CM

## 2023-05-19 DIAGNOSIS — M6281 Muscle weakness (generalized): Secondary | ICD-10-CM

## 2023-05-19 NOTE — Therapy (Signed)
OUTPATIENT PHYSICAL THERAPY THORACOLUMBAR TREATMENT   Patient Name: Vanessa Blair MRN: [id number] DOB:Jan 29, 1951, 73 y.o., female Today's Date: 05/19/2023  END OF SESSION:  PT End of Session - 05/19/23 1151     Visit Number 2    Date for PT Re-Evaluation 06/09/23    PT Start Time 1147    PT Stop Time 1230    PT Time Calculation (min) 43 min    Activity Tolerance Patient tolerated treatment well    Behavior During Therapy WFL for tasks assessed/performed             Past Medical History:  Diagnosis Date   Acid reflux    Anemia    Anxiety    Arthritis    Hiatal hernia    Hypercholesteremia    Hypertension    Past Surgical History:  Procedure Laterality Date   BREAST SURGERY     NASAL SINUS SURGERY     TOTAL HIP ARTHROPLASTY     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   Procedure: TOTAL KNEE ARTHROPLASTY;  Surgeon: Ollen Gross, MD;  Location: [facility];  Service: Orthopedics;  Laterality: Left;   TUBAL LIGATION     Patient Active Problem List   Diagnosis Date Noted   OA (osteoarthritis) of knee 07/18/2019   Primary osteoarthritis of left knee 07/18/2019    PCP: Dr. Ardyth Gal  REFERRING PROVIDER:  Dr. Venita Lick  REFERRING DIAG: Scoliosis  Rationale for Evaluation and Treatment: Rehabilitation  THERAPY DIAG:  Abnormal posture  Muscle weakness (generalized)  Pain in thoracic spine  ONSET DATE: not specified  SUBJECTIVE:                                                                                                                                                                                           SUBJECTIVE STATEMENT: "All right"   PERTINENT HISTORY:  L TKA (07/27/2019) THA OA Osteoporosis Hypertension  PAIN:  Are you having pain? Yes: NPRS scale: 7/10 Pain location: mainly in the shoulders Pain description: dull Aggravating factors: lifting heavy objects (groceries) Relieving factors: biofreeze  PRECAUTIONS: None  RED  FLAGS: None   WEIGHT BEARING RESTRICTIONS: No  FALLS:  Has patient fallen in last 6 months? No  LIVING ENVIRONMENT: Lives with: lives alone Lives in: House/apartment Stairs: No Has following equipment at home: None  OCCUPATION: retired  PLOF: Independent  PATIENT GOALS: cleaning with no pain (mopping), lifting 24 pack of water  NEXT MD VISIT: none scheduled  OBJECTIVE:  Note: Objective measures were completed at Evaluation unless otherwise noted.  DIAGNOSTIC FINDINGS:  none  COGNITION: Overall cognitive status: Within functional limits for  tasks assessed     SENSATION: WFL  POSTURE: rounded shoulders, forward head, decreased lumbar lordosis, and increased thoracic kyphosis  PALPATION: CPAs: C7-T3 were hypomobile, painful; only C7 got better with repeated motions  LUMBAR ROM:   AROM eval  Flexion 100%*  Extension 50%*  Right lateral flexion 50%*  Left lateral flexion 50%*  Right rotation 50%*  Left rotation 50%*   (Blank rows = not tested, * = pain)  LOWER EXTREMITY ROM:   grossly WFL BLE  Cervical ROM: Grossly WFL, pain with right side bend (pain felt on left side), left rotation, flexion and extension   LOWER EXTREMITY MMT:    MMT Right eval Left eval  Hip flexion 4-/5 4-/5  Hip extension    Hip abduction 4 4  Hip adduction 4- 4-  Hip internal rotation    Hip external rotation    Knee flexion    Knee extension    Ankle dorsiflexion    Ankle plantarflexion    Ankle inversion    Ankle eversion     (Blank rows = not tested)  FUNCTIONAL TESTS:  5 times sit to stand: 20.67 Timed up and go (TUG): 12.04  GAIT: Distance walked: in clinic distances Assistive device utilized: None Level of assistance: Complete Independence Comments: knee valgus   TREATMENT DATE:  05/19/23 NuStep L5 x 6 min Hs curls 25lb 2x10 Leg Ext 5lb 2x10 Shoulder Rows & Ext green 2x10 Seated ab sets w/ Pball 2x10 Shoulder ER yellow 2x10 Seated OHP yellow ball  2x10  05/12/23 EVAL                                                                                                                                  PATIENT EDUCATION:  Education details: POC and HEP Person educated: Patient Education method: Explanation Education comprehension: verbalized understanding and returned demonstration  HOME EXERCISE PROGRAM: Access Code: [email or web address] URL: [email or web address] Date: 05/12/2023 Prepared by: Cassie Freer Exercises - Seated March  - 1 x daily - 7 x weekly - 2 sets - 10 reps - Seated Hip Abduction with Resistance  - 1 x daily - 7 x weekly - 2 sets - 10 reps - Seated Cervical Sidebending Stretch  - 1 x daily - 7 x weekly - 2 sets - 10 reps - 10 hold - Seated Cervical Flexion Stretch with Finger Support Behind Neck  - 1 x daily - 7 x weekly - 2 sets - 10 reps - 10 hold   ASSESSMENT:  CLINICAL IMPRESSION: Patient is a 73 y.o. female who was seen today for physical therapy treatment for scoliosis. Pt enters feeling ok. Introduced pt to some posterior chain strengthening. Postural cue needed with rows and extensions. Cues to engage core with seated ab sets. No reports of increase pian during session. Pt will benefit from skilled PT to work on posture, decrease pain, and address weakness in her hip flexors.   OBJECTIVE IMPAIRMENTS:  Abnormal gait, difficulty walking, decreased ROM, decreased strength, and pain.   ACTIVITY LIMITATIONS: carrying, lifting, bending, sitting, standing, and stairs  PARTICIPATION LIMITATIONS: cleaning, laundry, shopping, community activity, and yard work  PERSONAL FACTORS: Age, Behavior pattern, and Past/current experiences are also affecting patient's functional outcome.   REHAB POTENTIAL: Good  CLINICAL DECISION MAKING: Stable/uncomplicated  EVALUATION COMPLEXITY: Low  GOALS: Goals reviewed with patient? No  SHORT TERM GOALS: Target date: 05/26/23  Patient will be independent with initial HEP.  Baseline:  given 05/12/23 Goal status: INITIAL  LONG TERM GOALS: Target date: 06/09/23  Patient will be independent with advanced/ongoing HEP to improve outcomes and carryover.  Baseline:  Goal status: INITIAL  2.  Patient will report 50-75% improvement in neck/shoulder pain to improve QOL.  Baseline: 7/10 (05/12/23) Goal status: INITIAL  3.  Patient will demonstrate full pain free lumbar ROM to perform ADLs.   Baseline: see chart above Goal status: INITIAL  4.  Patient will be able to carry a case of water and groceries into the house without pain  Baseline: unable to (05/12/23) Goal status: INITIAL  5.  Patient will be able to mop house without reporting pain. Baseline: reports pain with mopping Goal status: INITIAL  6.  Patient to demonstrate ability to achieve and maintain good spinal alignment/posturing and body mechanics needed for daily activities. Baseline: rounded shoulders, forward head, increased thoracic kyphosis, scoliosis Goal status: INITIAL  PLAN:  PT FREQUENCY: 1x/week  PT DURATION: 4 weeks  PLANNED INTERVENTIONS: 97110-Therapeutic exercises, 97530- Therapeutic activity, 97112- Neuromuscular re-education, 97535- Self Care, [id number]- Manual therapy, Dry Needling, Cryotherapy, and Moist heat.  PLAN FOR NEXT SESSION: neck stretching to address, dry needling to neck, address posture   Grayce Sessions, PTA 05/19/2023, 11:52 AM

## 2023-05-26 ENCOUNTER — Ambulatory Visit: Payer: 59 | Admitting: Physical Therapy

## 2023-05-26 ENCOUNTER — Encounter: Payer: Self-pay | Admitting: Physical Therapy

## 2023-05-26 DIAGNOSIS — M6281 Muscle weakness (generalized): Secondary | ICD-10-CM

## 2023-05-26 DIAGNOSIS — M542 Cervicalgia: Secondary | ICD-10-CM

## 2023-05-26 DIAGNOSIS — R293 Abnormal posture: Secondary | ICD-10-CM

## 2023-05-26 DIAGNOSIS — M4122 Other idiopathic scoliosis, cervical region: Secondary | ICD-10-CM | POA: Diagnosis not present

## 2023-05-26 DIAGNOSIS — M546 Pain in thoracic spine: Secondary | ICD-10-CM

## 2023-05-26 NOTE — Therapy (Signed)
OUTPATIENT PHYSICAL THERAPY THORACOLUMBAR TREATMENT   Patient Name: Vanessa Blair MRN: [id number] DOB:[birth year], 73 y.o., female Today's Date: 05/26/2023  END OF SESSION:  PT End of Session - 05/26/23 1154     Visit Number 3    Date for PT Re-Evaluation 06/09/23    PT Start Time 1151    PT Stop Time 1230    PT Time Calculation (min) 39 min    Activity Tolerance Patient tolerated treatment well    Behavior During Therapy WFL for tasks assessed/performed             Past Medical History:  Diagnosis Date   Acid reflux    Anemia    Anxiety    Arthritis    Hiatal hernia    Hypercholesteremia    Hypertension    Past Surgical History:  Procedure Laterality Date   BREAST SURGERY     NASAL SINUS SURGERY     TOTAL HIP ARTHROPLASTY     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   Procedure: TOTAL KNEE ARTHROPLASTY;  Surgeon: Ollen Gross, MD;  Location: [facility];  Service: Orthopedics;  Laterality: Left;   TUBAL LIGATION     Patient Active Problem List   Diagnosis Date Noted   OA (osteoarthritis) of knee 07/18/2019   Primary osteoarthritis of left knee 07/18/2019    PCP: Dr. Ardyth Gal  REFERRING PROVIDER:  Dr. Venita Lick  REFERRING DIAG: Scoliosis  Rationale for Evaluation and Treatment: Rehabilitation  THERAPY DIAG:  Abnormal posture  Muscle weakness (generalized)  Pain in thoracic spine  Neck pain  ONSET DATE: not specified  SUBJECTIVE:                                                                                                                                                                                           SUBJECTIVE STATEMENT: "ok"  PERTINENT HISTORY:  L TKA (07/27/2019) THA OA Osteoporosis Hypertension  PAIN:  Are you having pain? Yes: NPRS scale: 6-7/10 Pain location: mainly in the shoulders Pain description: dull Aggravating factors: lifting heavy objects (groceries) Relieving factors: biofreeze  PRECAUTIONS:  None  RED FLAGS: None   WEIGHT BEARING RESTRICTIONS: No  FALLS:  Has patient fallen in last 6 months? No  LIVING ENVIRONMENT: Lives with: lives alone Lives in: House/apartment Stairs: No Has following equipment at home: None  OCCUPATION: retired  PLOF: Independent  PATIENT GOALS: cleaning with no pain (mopping), lifting 24 pack of water  NEXT MD VISIT: none scheduled  OBJECTIVE:  Note: Objective measures were completed at Evaluation unless otherwise noted.  DIAGNOSTIC FINDINGS:  none  COGNITION: Overall cognitive status: Within functional limits  for tasks assessed     SENSATION: WFL  POSTURE: rounded shoulders, forward head, decreased lumbar lordosis, and increased thoracic kyphosis  PALPATION: CPAs: C7-T3 were hypomobile, painful; only C7 got better with repeated motions  LUMBAR ROM:   AROM eval  Flexion 100%*  Extension 50%*  Right lateral flexion 50%*  Left lateral flexion 50%*  Right rotation 50%*  Left rotation 50%*   (Blank rows = not tested, * = pain)  LOWER EXTREMITY ROM:   grossly WFL BLE  Cervical ROM: Grossly WFL, pain with right side bend (pain felt on left side), left rotation, flexion and extension   LOWER EXTREMITY MMT:    MMT Right eval Left eval  Hip flexion 4-/5 4-/5  Hip extension    Hip abduction 4 4  Hip adduction 4- 4-  Hip internal rotation    Hip external rotation    Knee flexion    Knee extension    Ankle dorsiflexion    Ankle plantarflexion    Ankle inversion    Ankle eversion     (Blank rows = not tested)  FUNCTIONAL TESTS:  5 times sit to stand: 20.67 Timed up and go (TUG): 12.04  GAIT: Distance walked: in clinic distances Assistive device utilized: None Level of assistance: Complete Independence Comments: knee valgus   TREATMENT DATE:  05/26/23 NuStep L5 x 6 min S2S OHP w/ yellow ball 2x10 Horix abd red 2x10 Hs curls 25lb 2x12 Leg Ext 10lb 2x10 Shoulder Ext 10lb 2x10  05/19/23 NuStep L5 x 6  min Hs curls 25lb 2x10 Leg Ext 5lb 2x10 Shoulder Rows & Ext green 2x10 Seated ab sets w/ Pball 2x10 Shoulder ER yellow 2x10 Seated OHP yellow ball 2x10  05/12/23 EVAL                                                                                                                                  PATIENT EDUCATION:  Education details: POC and HEP Person educated: Patient Education method: Explanation Education comprehension: verbalized understanding and returned demonstration  HOME EXERCISE PROGRAM: Access Code: [email or web address] URL: [email or web address] Date: 05/12/2023 Prepared by: Cassie Freer Exercises - Seated March  - 1 x daily - 7 x weekly - 2 sets - 10 reps - Seated Hip Abduction with Resistance  - 1 x daily - 7 x weekly - 2 sets - 10 reps - Seated Cervical Sidebending Stretch  - 1 x daily - 7 x weekly - 2 sets - 10 reps - 10 hold - Seated Cervical Flexion Stretch with Finger Support Behind Neck  - 1 x daily - 7 x weekly - 2 sets - 10 reps - 10 hold   ASSESSMENT:  CLINICAL IMPRESSION: Patient is a 73 y.o. female who was seen today for physical therapy treatment for scoliosis. Pt enters feeling ok despite pain rating. Pt has forwards rounded shoulder at rest. Tactiel cus needed with shoulder ext to maintain posture. Increase reps tolerated  with curls and extensions. Sit to stands did cause some fatigue.  Pt will benefit from skilled PT to work on posture, decrease pain, and address weakness in her hip flexors.   OBJECTIVE IMPAIRMENTS: Abnormal gait, difficulty walking, decreased ROM, decreased strength, and pain.   ACTIVITY LIMITATIONS: carrying, lifting, bending, sitting, standing, and stairs  PARTICIPATION LIMITATIONS: cleaning, laundry, shopping, community activity, and yard work  PERSONAL FACTORS: Age, Behavior pattern, and Past/current experiences are also affecting patient's functional outcome.   REHAB POTENTIAL: Good  CLINICAL DECISION MAKING:  Stable/uncomplicated  EVALUATION COMPLEXITY: Low  GOALS: Goals reviewed with patient? No  SHORT TERM GOALS: Target date: 05/26/23  Patient will be independent with initial HEP.  Baseline: given 05/12/23 Goal status: INITIAL  LONG TERM GOALS: Target date: 06/09/23  Patient will be independent with advanced/ongoing HEP to improve outcomes and carryover.  Baseline:  Goal status: INITIAL  2.  Patient will report 50-75% improvement in neck/shoulder pain to improve QOL.  Baseline: 7/10 (05/12/23) Goal status: INITIAL  3.  Patient will demonstrate full pain free lumbar ROM to perform ADLs.   Baseline: see chart above Goal status: INITIAL  4.  Patient will be able to carry a case of water and groceries into the house without pain  Baseline: unable to (05/12/23) Goal status: INITIAL  5.  Patient will be able to mop house without reporting pain. Baseline: reports pain with mopping Goal status: INITIAL  6.  Patient to demonstrate ability to achieve and maintain good spinal alignment/posturing and body mechanics needed for daily activities. Baseline: rounded shoulders, forward head, increased thoracic kyphosis, scoliosis Goal status: INITIAL  PLAN:  PT FREQUENCY: 1x/week  PT DURATION: 4 weeks  PLANNED INTERVENTIONS: 97110-Therapeutic exercises, 97530- Therapeutic activity, 97112- Neuromuscular re-education, 97535- Self Care, [id number]- Manual therapy, Dry Needling, Cryotherapy, and Moist heat.  PLAN FOR NEXT SESSION: neck stretching to address, dry needling to neck, address posture   Grayce Sessions, PTA 05/26/2023, 11:54 AM

## 2023-06-02 ENCOUNTER — Ambulatory Visit: Payer: 59 | Admitting: Physical Therapy

## 2023-06-02 ENCOUNTER — Encounter: Payer: Self-pay | Admitting: Physical Therapy

## 2023-06-02 DIAGNOSIS — M542 Cervicalgia: Secondary | ICD-10-CM

## 2023-06-02 DIAGNOSIS — R293 Abnormal posture: Secondary | ICD-10-CM

## 2023-06-02 DIAGNOSIS — M4122 Other idiopathic scoliosis, cervical region: Secondary | ICD-10-CM | POA: Diagnosis not present

## 2023-06-02 DIAGNOSIS — M546 Pain in thoracic spine: Secondary | ICD-10-CM

## 2023-06-02 DIAGNOSIS — M6281 Muscle weakness (generalized): Secondary | ICD-10-CM

## 2023-06-02 NOTE — Therapy (Signed)
 OUTPATIENT PHYSICAL THERAPY THORACOLUMBAR TREATMENT   Patient Name: Vanessa Blair MRN: [id number] DOB:05-09-1950, 73 y.o., female Today's Date: 06/02/2023  END OF SESSION:  PT End of Session - 06/02/23 1146     Visit Number 4    Date for PT Re-Evaluation 06/09/23    PT Start Time 1146    PT Stop Time 1230    PT Time Calculation (min) 44 min    Activity Tolerance Patient tolerated treatment well    Behavior During Therapy WFL for tasks assessed/performed             Past Medical History:  Diagnosis Date   Acid reflux    Anemia    Anxiety    Arthritis    Hiatal hernia    Hypercholesteremia    Hypertension    Past Surgical History:  Procedure Laterality Date   BREAST SURGERY     NASAL SINUS SURGERY     TOTAL HIP ARTHROPLASTY     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   Procedure: TOTAL KNEE ARTHROPLASTY;  Surgeon: Ollen Gross, MD;  Location: [facility];  Service: Orthopedics;  Laterality: Left;   TUBAL LIGATION     Patient Active Problem List   Diagnosis Date Noted   OA (osteoarthritis) of knee 07/18/2019   Primary osteoarthritis of left knee 07/18/2019    PCP: Dr. Ardyth Gal  REFERRING PROVIDER:  Dr. Venita Lick  REFERRING DIAG: Scoliosis  Rationale for Evaluation and Treatment: Rehabilitation  THERAPY DIAG:  Abnormal posture  Muscle weakness (generalized)  Pain in thoracic spine  Neck pain  ONSET DATE: not specified  SUBJECTIVE:                                                                                                                                                                                           SUBJECTIVE STATEMENT: Back is tight today  PERTINENT HISTORY:  L TKA (07/27/2019) THA OA Osteoporosis Hypertension  PAIN:  Are you having pain? Yes: NPRS scale: 7/10 Pain location: mainly in the shoulders Pain description: dull Aggravating factors: lifting heavy objects (groceries) Relieving factors:  biofreeze  PRECAUTIONS: None  RED FLAGS: None   WEIGHT BEARING RESTRICTIONS: No  FALLS:  Has patient fallen in last 6 months? No  LIVING ENVIRONMENT: Lives with: lives alone Lives in: House/apartment Stairs: No Has following equipment at home: None  OCCUPATION: retired  PLOF: Independent  PATIENT GOALS: cleaning with no pain (mopping), lifting 24 pack of water  NEXT MD VISIT: none scheduled  OBJECTIVE:  Note: Objective measures were completed at Evaluation unless otherwise noted.  DIAGNOSTIC FINDINGS:  none  COGNITION: Overall cognitive status:  Within functional limits for tasks assessed     SENSATION: WFL  POSTURE: rounded shoulders, forward head, decreased lumbar lordosis, and increased thoracic kyphosis  PALPATION: CPAs: C7-T3 were hypomobile, painful; only C7 got better with repeated motions  LUMBAR ROM:   AROM eval  Flexion 100%*  Extension 50%*  Right lateral flexion 50%*  Left lateral flexion 50%*  Right rotation 50%*  Left rotation 50%*   (Blank rows = not tested, * = pain)  LOWER EXTREMITY ROM:   grossly WFL BLE  Cervical ROM: Grossly WFL, pain with right side bend (pain felt on left side), left rotation, flexion and extension   LOWER EXTREMITY MMT:    MMT Right eval Left eval  Hip flexion 4-/5 4-/5  Hip extension    Hip abduction 4 4  Hip adduction 4- 4-  Hip internal rotation    Hip external rotation    Knee flexion    Knee extension    Ankle dorsiflexion    Ankle plantarflexion    Ankle inversion    Ankle eversion     (Blank rows = not tested)  FUNCTIONAL TESTS:  5 times sit to stand: 20.67 Timed up and go (TUG): 12.04  GAIT: Distance walked: in clinic distances Assistive device utilized: None Level of assistance: Complete Independence Comments: knee valgus   TREATMENT DATE: 06/02/23 NuStep L5 x 6 min Trigger Point Dry Needling  Initial Treatment: Pt instructed on Dry Needling rational, procedures, and possible  side effects. Pt instructed to expect mild to moderate muscle soreness later in the day and/or into the next day.  Pt instructed in methods to reduce muscle soreness. Pt instructed to continue prescribed HEP. Because Dry Needling was performed over or adjacent to a lung field, pt was educated on S/S of pneumothorax and to seek immediate medical attention should they occur.  Patient was educated on signs and symptoms of infection and other risk factors and advised to seek medical attention should they occur.  Patient verbalized understanding of these instructions and education.   Patient Verbal Consent Given: Yes Education Handout Provided: Yes Muscles Treated: bilateral Upper traps Treatment Response/Outcome: LTR's DN performed and documented by Stacie Glaze, PT Hs curls 25lb 2x12 Leg Ext 10lb 2x10 Horiz abd red 2x10 Shoulder ER red 2x10  MHP to cervical spine & UT   05/26/23 NuStep L5 x 6 min S2S OHP w/ yellow ball 2x10 Horix abd red 2x10 Hs curls 25lb 2x12 Leg Ext 10lb 2x10 Shoulder Ext 10lb 2x10  05/19/23 NuStep L5 x 6 min Hs curls 25lb 2x10 Leg Ext 5lb 2x10 Shoulder Rows & Ext green 2x10 Seated ab sets w/ Pball 2x10 Shoulder ER yellow 2x10 Seated OHP yellow ball 2x10  05/12/23 EVAL                                                                                                                                  PATIENT EDUCATION:  Education  details: POC and HEP Person educated: Patient Education method: Explanation Education comprehension: verbalized understanding and returned demonstration  HOME EXERCISE PROGRAM: Access Code: [email or web address] URL: [email or web address] Date: 05/12/2023 Prepared by: Cassie Freer Exercises - Seated March  - 1 x daily - 7 x weekly - 2 sets - 10 reps - Seated Hip Abduction with Resistance  - 1 x daily - 7 x weekly - 2 sets - 10 reps - Seated Cervical Sidebending Stretch  - 1 x daily - 7 x weekly - 2 sets - 10 reps - 10 hold - Seated  Cervical Flexion Stretch with Finger Support Behind Neck  - 1 x daily - 7 x weekly - 2 sets - 10 reps - 10 hold   ASSESSMENT:  CLINICAL IMPRESSION: Patient is a 73 y.o. female who was seen today for physical therapy treatment for scoliosis. Forwards rounded shoulder at rest. Lead Pt assisted in session providing Pt with DN. Increase reps and or weight  tolerated with machine level interventions. Tactiel cues needed to squeeze shoulder together with ER. Pt will benefit from skilled PT to work on posture, decrease pain, and address weakness in her hip flexors.   OBJECTIVE IMPAIRMENTS: Abnormal gait, difficulty walking, decreased ROM, decreased strength, and pain.   ACTIVITY LIMITATIONS: carrying, lifting, bending, sitting, standing, and stairs  PARTICIPATION LIMITATIONS: cleaning, laundry, shopping, community activity, and yard work  PERSONAL FACTORS: Age, Behavior pattern, and Past/current experiences are also affecting patient's functional outcome.   REHAB POTENTIAL: Good  CLINICAL DECISION MAKING: Stable/uncomplicated  EVALUATION COMPLEXITY: Low  GOALS: Goals reviewed with patient? No  SHORT TERM GOALS: Target date: 05/26/23  Patient will be independent with initial HEP.  Baseline: given 05/12/23 Goal status: Met 06/02/23  LONG TERM GOALS: Target date: 06/09/23  Patient will be independent with advanced/ongoing HEP to improve outcomes and carryover.  Baseline:  Goal status: INITIAL  2.  Patient will report 50-75% improvement in neck/shoulder pain to improve QOL.  Baseline: 7/10 (05/12/23) Goal status: INITIAL  3.  Patient will demonstrate full pain free lumbar ROM to perform ADLs.   Baseline: see chart above Goal status: INITIAL  4.  Patient will be able to carry a case of water and groceries into the house without pain  Baseline: unable to (05/12/23) Goal status: Carries 2 bags at a time, does not carry water 06/02/23  5.  Patient will be able to mop house without reporting  pain. Baseline: reports pain with mopping Goal status: Gets her son to do it 06/02/23  6.  Patient to demonstrate ability to achieve and maintain good spinal alignment/posturing and body mechanics needed for daily activities. Baseline: rounded shoulders, forward head, increased thoracic kyphosis, scoliosis Goal status: INITIAL  PLAN:  PT FREQUENCY: 1x/week  PT DURATION: 4 weeks  PLANNED INTERVENTIONS: 97110-Therapeutic exercises, 97530- Therapeutic activity, 97112- Neuromuscular re-education, 97535- Self Care, 56387- Manual therapy, Dry Needling, Cryotherapy, and Moist heat.  PLAN FOR NEXT SESSION: neck stretching to address, dry needling to neck, address posture   Grayce Sessions, PTA 06/02/2023, 11:47 AM

## 2023-06-02 NOTE — Patient Instructions (Signed)
 Puncin seca en puntos gatillo/de tensin Trigger Point Dry Needling  Qu es la puncin seca en puntos gatillo/de tensin (DN)? La DN es una tcnica de fisioterapia [place] para tratar Chief Technology Officer y las disfunciones musculares. Especficamente, la DN ayuda a desactivar los puntos gatillo musculares (nudos musculares). Se utiliza una aguja filiforme fina para Customer service manager en la piel y Risk manager el punto gatillo que se encuentra debajo. El objetivo es que se produzca una respuesta de Armed forces logistics/support/administrative officer (LTR) y que el punto gatillo se relaje. Durante el procedimiento no se inyecta ningn tipo de medicacin.    Qu se siente al aplicar la puncin seca en los puntos gatillo/de tensin? El procedimiento es diferente para cada paciente. Algunos pacientes dicen que en realidad no sienten la entrada de la aguja en la piel y que, en general, el proceso no es doloroso. Puede haber un sangrado muy leve. Sin embargo, muchos pacientes sienten un profundo calambre en el msculo en el que se ha introducido la Jeannette. Se trata de la respuesta de Armed forces logistics/support/administrative officer.  Cmo me sentir despus del tratamiento? El dolor es normal y puede que no aparezca hasta pasadas algunas horas. Generalmente, este dolor no dura ms [employer].   Los moretones no son frecuentes; sin embargo, se puede Chemical engineer hielo para reducirlos.   En raros casos es normal sentir cansancio o nuseas despus del tratamiento. Adems, sus sntomas pueden empeorar antes de mejorar, este perodo no durar por lo general ms de 24 horas.  Qu puedo hacer despus del tratamiento? Aumente su hidratacin tomando ms agua durante las prximas 24 horas. Puede ponerse hielo o calor en las zonas tratadas que hayan quedado doloridas; sin embargo, no use calor en zonas inflamadas o con moretones. El calor con frecuencia alivia ms despus de la insercin de agujas. Puede continuar con sus actividades normales, pero no se recomienda hacer actividades vigorosas  inicialmente despus del tratamiento por 24 horas. La DN funciona mejor cuando se [place] con otras terapias fsicas como el fortalecimiento, los estiramientos y [place] terapias.  Cules son las complicaciones? Aunque su terapeuta cuenta con una capacitacin amplia para minimizar los riesgos de la puncin seca en los puntos gatillo/de tensin, es importante conocer los riesgos de cualquier procedimiento. Los riesgos incluyen sangrado, Engineer, mining, fatiga, hematoma, infeccin, vrtigo, nuseas o afeccin de los nervios. Obsrvese para cualquier cambio en la piel o en la sensibilidad. Hable con su terapeuta o mdico si tiene inquietudes. Una complicacin poco frecuente pero grave es un neumotrax sobre o cerca de las zonas media y superior de su pecho y espalda. Si le hacen puncin seca en esta rea, obsrvese para los siguientes sntomas: Dificultad para respirar con el esfuerzo y/o Dificultad para respirar profundamente y/o Engineer, mining en el pecho y/o [place] tos seca Si aparece alguno de los sntomas anteriores, por favor vaya a la sala de urgencias ms cercana o llame al 911. Explqueles que le hicieron puncin seca sobre el trax e infrmeles de cualquier sntoma que tenga. Haga un seguimiento con el(la) terapeuta que le atendi despus de completar la evaluacin mdica.

## 2023-06-06 ENCOUNTER — Encounter (HOSPITAL_BASED_OUTPATIENT_CLINIC_OR_DEPARTMENT_OTHER): Payer: Self-pay | Admitting: Emergency Medicine

## 2023-06-06 ENCOUNTER — Emergency Department (HOSPITAL_BASED_OUTPATIENT_CLINIC_OR_DEPARTMENT_OTHER)
Admission: EM | Admit: 2023-06-06 | Discharge: 2023-06-06 | Disposition: A | Discharge status: Home / Self Care | Attending: Emergency Medicine | Admitting: Emergency Medicine

## 2023-06-06 DIAGNOSIS — I10 Essential (primary) hypertension: Secondary | ICD-10-CM | POA: Insufficient documentation

## 2023-06-06 DIAGNOSIS — J101 Influenza due to other identified influenza virus with other respiratory manifestations: Secondary | ICD-10-CM | POA: Diagnosis not present

## 2023-06-06 DIAGNOSIS — R059 Cough, unspecified: Secondary | ICD-10-CM | POA: Diagnosis present

## 2023-06-06 DIAGNOSIS — Z79899 Other long term (current) drug therapy: Secondary | ICD-10-CM | POA: Insufficient documentation

## 2023-06-06 LAB — RESP PANEL BY RT-PCR (RSV, FLU A&B, COVID)  RVPGX2
Influenza A by PCR: POSITIVE — AB
Influenza B by PCR: NEGATIVE
Resp Syncytial Virus by PCR: NEGATIVE
SARS Coronavirus 2 by RT PCR: NEGATIVE

## 2023-06-06 MED ORDER — BENZONATATE 100 MG PO CAPS: 100.0000 mg | ORAL_CAPSULE | Freq: Three times a day (TID) | ORAL | 0 refills | Status: AC

## 2023-06-06 MED ORDER — OSELTAMIVIR PHOSPHATE 75 MG PO CAPS: 75.0000 mg | ORAL_CAPSULE | Freq: Two times a day (BID) | ORAL | 0 refills | Status: AC

## 2023-06-06 NOTE — ED Triage Notes (Signed)
 Pt reports cough and sneezing since last night

## 2023-06-06 NOTE — ED Provider Notes (Signed)
 [place] [facility] Provider Note   CSN: [id number] Arrival date & time: 06/06/23  1133     History  Chief Complaint  Patient presents with   Cough    Vanessa Blair is a 73 y.o. female with past medical history of hypertension, hyperlipidemia, anxiety presenting to emergency room with 24 hours of cough, congestion and sneezing.  Patient reports no known sick exposure.  She denies fever.  She has not been having any productive cough.  No chest pain, shortness of breath abdominal pain nausea vomiting diarrhea.  She reports she is feeling generally well.   Cough      Home Medications Prior to Admission medications   Medication Sig Start Date End Date Taking? Authorizing Provider  ALPRAZolam (XANAX) 0.25 MG tablet Take 0.25 mg by mouth at bedtime. 06/13/19   [provider]  calcium carbonate (OS-CAL - DOSED IN MG OF ELEMENTAL CALCIUM) 1250 (500 Ca) MG tablet Take 1 tablet by mouth daily with breakfast.    [provider]  cholecalciferol (VITAMIN D3) 25 MCG (1000 UNIT) tablet Take 1,000 Units by mouth daily.    [provider]  esomeprazole (NEXIUM) 40 MG capsule Take 40 mg by mouth daily. 06/05/19   [provider]  fluticasone (FLONASE) 50 MCG/ACT nasal spray Place 1 spray into both nostrils daily as needed for allergies. 12/01/16   [provider]  gabapentin (NEURONTIN) 300 MG capsule Take 1 capsule (300 mg total) by mouth 3 (three) times daily. Take a 300 mg capsule three times a day for two weeks following surgery.Then take a 300 mg capsule two times a day for two weeks. Then take a 300 mg capsule once a day for two weeks. Then discontinue the Gabapentin. 07/20/19   Cassandria Anger, PA-C  lisinopril-hydrochlorothiazide (ZESTORETIC) 20-25 MG tablet Take 1 tablet by mouth daily. 06/05/19   [provider]  meclizine (ANTIVERT) 25 MG tablet Take 1 tablet (25 mg total) by mouth 3 (three) times  daily as needed for dizziness. 10/17/19   Melene Plan, DO  metoprolol tartrate (LOPRESSOR) 50 MG tablet Take 50 mg by mouth 2 (two) times daily. 06/05/19   [provider]  Multiple Vitamin (MULTIVITAMIN WITH MINERALS) TABS tablet Take 1 tablet by mouth daily.    [provider]  simvastatin (ZOCOR) 20 MG tablet Take 20 mg by mouth at bedtime. 02/08/19   [provider]  vitamin B-12 (CYANOCOBALAMIN) 1000 MCG tablet Take 1,000 mcg by mouth daily.    [provider]      Allergies    Vicodin [hydrocodone-acetaminophen]    Review of Systems   Review of Systems  Respiratory:  Positive for cough.     Physical Exam Updated Vital Signs BP 108/69 (BP Location: Left Arm)   Pulse 72   Temp 98.4 F (36.9 C)   Resp 17   Ht 5\' 7"  (1.702 m)   Wt 99.8 kg   SpO2 98%   BMI 34.46 kg/m  Physical Exam Vitals and nursing note reviewed.  Constitutional:      General: She is not in acute distress.    Appearance: She is not toxic-appearing.  HENT:     Head: Normocephalic and atraumatic.     Nose: No congestion or rhinorrhea.  Eyes:     General: No scleral icterus.    Conjunctiva/sclera: Conjunctivae normal.  Cardiovascular:     Rate and Rhythm: Normal rate and regular rhythm.  Pulses: Normal pulses.     Heart sounds: Normal heart sounds.  Pulmonary:     Effort: Pulmonary effort is normal. No respiratory distress.     Breath sounds: Normal breath sounds.  Abdominal:     General: Abdomen is flat. Bowel sounds are normal.     Palpations: Abdomen is soft.     Tenderness: There is no abdominal tenderness.  Musculoskeletal:     Right lower leg: No edema.     Left lower leg: No edema.  Skin:    General: Skin is warm and dry.     Findings: No lesion.  Neurological:     General: No focal deficit present.     Mental Status: She is alert and oriented to person, place, and time. Mental status is at baseline.     ED Results / Procedures / Treatments    Labs (all labs ordered are listed, but only abnormal results are displayed) Labs Reviewed  RESP PANEL BY RT-PCR (RSV, FLU A&B, COVID)  RVPGX2 - Abnormal; Notable for the following components:      Result Value   Influenza A by PCR POSITIVE (*)    All other components within normal limits    EKG None  Radiology No results found.  Procedures Procedures    Medications Ordered in ED Medications - No data to display  ED Course/ Medical Decision Making/ A&P                                 Medical Decision Making Risk Prescription drug management.   [name] 73 y.o. presented today for URI like symptoms. Working DDx that I considered at this time includes, but not limited to, viral illness, pharyngitis, mono, sinusitis, electrolyte abnormality, AOM.  R/o DDx: these additional diagnoses are not consistent with patient's history, presentation, physical exam, labs/imaging findings.  Labs:  Respiratory Panel: Influenza A  Imaging:  Do not feel this is necessary at this time as patient has COVID has had symptoms for only 24 hours and lungs are clear to auscultation.  Problem List / ED Course / Critical interventions / Medication management  Reporting to emergency room with URI-like symptoms.  She tested positive for influenza A.  She is well-appearing and hemodynamically stable.  Lungs clear to auscultation bilaterally.  No sign of systemic illness.  She reports she is feeling generally well.  She is not hypoxic.  Given reassuring physical exam and duration of symptoms do not feel further workup is necessary at this time.  I will start her on Tamiflu as well as prescribed Tessalon Perles for cough.  She was given return precautions and symptomatic management.  Patient expresses understanding and agrees to plan. Patients vitals assessed. Upon arrival patient is hemodynamically stable.  I have reviewed the patients home medicines and have made adjustments as  needed     Plan:  F/u w/ PCP in 2-3d to ensure resolution of sx.  Patient was given return precautions. Patient stable for discharge at this time.  Patient educated on sx and dx and verbalized understanding of plan. Return to ER if new or worsening sx.          Final Clinical Impression(s) / ED Diagnoses Final diagnoses:  Influenza A    Rx / DC Orders ED Discharge Orders          Ordered    oseltamivir (TAMIFLU) 75 MG capsule  Every 12 hours  06/06/23 1323    benzonatate (TESSALON) 100 MG capsule  Every 8 hours        06/06/23 1323              Fadia Marlar, Horald Chestnut, PA-C 06/06/23 1521    Pricilla Loveless, MD 06/07/23 [phone]

## 2023-06-06 NOTE — Discharge Instructions (Addendum)
 Sent Tamiflu to your pharmacy please take as prescribed for the flu.  I have also sent Jerilynn Som to your pharmacy please take as needed for cough. Stay well-hydrated with primarily water you can alternate Pedialyte and Gatorade as well.  Try bland foods if you experience upset stomach.  If you develop muscle aches or fever alternate Tylenol and ibuprofen.  You can take 1000 mg of Tylenol up to 4 times a day.  If you have new or worsening symptoms like chest pain or shortness of breath please return to emergency room.

## 2023-06-09 ENCOUNTER — Ambulatory Visit: Payer: 59

## 2023-06-22 NOTE — Therapy (Incomplete)
 OUTPATIENT PHYSICAL THERAPY THORACOLUMBAR TREATMENT   Patient Name: Vanessa Blair MRN: [id number] DOB:03/26/1951, 73 y.o., female Today's Date: 06/22/2023  END OF SESSION:    Past Medical History:  Diagnosis Date   Acid reflux    Anemia    Anxiety    Arthritis    Hiatal hernia    Hypercholesteremia    Hypertension    Past Surgical History:  Procedure Laterality Date   BREAST SURGERY     NASAL SINUS SURGERY     TOTAL HIP ARTHROPLASTY     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   Procedure: TOTAL KNEE ARTHROPLASTY;  Surgeon: Ollen Gross, MD;  Location: [facility];  Service: Orthopedics;  Laterality: Left;   TUBAL LIGATION     Patient Active Problem List   Diagnosis Date Noted   OA (osteoarthritis) of knee 07/18/2019   Primary osteoarthritis of left knee 07/18/2019    PCP: Dr. Ardyth Gal  REFERRING PROVIDER:  Dr. Venita Lick  REFERRING DIAG: Scoliosis  Rationale for Evaluation and Treatment: Rehabilitation  THERAPY DIAG:  No diagnosis found.  ONSET DATE: not specified  SUBJECTIVE:                                                                                                                                                                                           SUBJECTIVE STATEMENT: Back is tight today  PERTINENT HISTORY:  L TKA (07/27/2019) THA OA Osteoporosis Hypertension  PAIN:  Are you having pain? Yes: NPRS scale: 7/10 Pain location: mainly in the shoulders Pain description: dull Aggravating factors: lifting heavy objects (groceries) Relieving factors: biofreeze  PRECAUTIONS: None  RED FLAGS: None   WEIGHT BEARING RESTRICTIONS: No  FALLS:  Has patient fallen in last 6 months? No  LIVING ENVIRONMENT: Lives with: lives alone Lives in: House/apartment Stairs: No Has following equipment at home: None  OCCUPATION: retired  PLOF: Independent  PATIENT GOALS: cleaning with no pain (mopping), lifting 24 pack of water  NEXT MD  VISIT: none scheduled  OBJECTIVE:  Note: Objective measures were completed at Evaluation unless otherwise noted.  DIAGNOSTIC FINDINGS:  none  COGNITION: Overall cognitive status: Within functional limits for tasks assessed     SENSATION: WFL  POSTURE: rounded shoulders, forward head, decreased lumbar lordosis, and increased thoracic kyphosis  PALPATION: CPAs: C7-T3 were hypomobile, painful; only C7 got better with repeated motions  LUMBAR ROM:   AROM eval  Flexion 100%*  Extension 50%*  Right lateral flexion 50%*  Left lateral flexion 50%*  Right rotation 50%*  Left rotation 50%*   (Blank rows = not tested, * = pain)  LOWER EXTREMITY ROM:  grossly WFL BLE  Cervical ROM: Grossly WFL, pain with right side bend (pain felt on left side), left rotation, flexion and extension   LOWER EXTREMITY MMT:    MMT Right eval Left eval  Hip flexion 4-/5 4-/5  Hip extension    Hip abduction 4 4  Hip adduction 4- 4-  Hip internal rotation    Hip external rotation    Knee flexion    Knee extension    Ankle dorsiflexion    Ankle plantarflexion    Ankle inversion    Ankle eversion     (Blank rows = not tested)  FUNCTIONAL TESTS:  5 times sit to stand: 20.67 Timed up and go (TUG): 12.04  GAIT: Distance walked: in clinic distances Assistive device utilized: None Level of assistance: Complete Independence Comments: knee valgus   TREATMENT DATE: 06/22/23 Recheck goals and recert NuStep Shoulder ext Rows and Lats HS curls Leg ext  06/02/23 NuStep L5 x 6 min Trigger Point Dry Needling  Initial Treatment: Pt instructed on Dry Needling rational, procedures, and possible side effects. Pt instructed to expect mild to moderate muscle soreness later in the day and/or into the next day.  Pt instructed in methods to reduce muscle soreness. Pt instructed to continue prescribed HEP. Because Dry Needling was performed over or adjacent to a lung field, pt was educated on S/S  of pneumothorax and to seek immediate medical attention should they occur.  Patient was educated on signs and symptoms of infection and other risk factors and advised to seek medical attention should they occur.  Patient verbalized understanding of these instructions and education.   Patient Verbal Consent Given: Yes Education Handout Provided: Yes Muscles Treated: bilateral Upper traps Treatment Response/Outcome: LTR's DN performed and documented by Stacie Glaze, PT Hs curls 25lb 2x12 Leg Ext 10lb 2x10 Horiz abd red 2x10 Shoulder ER red 2x10  MHP to cervical spine & UT   05/26/23 NuStep L5 x 6 min S2S OHP w/ yellow ball 2x10 Horix abd red 2x10 Hs curls 25lb 2x12 Leg Ext 10lb 2x10 Shoulder Ext 10lb 2x10  05/19/23 NuStep L5 x 6 min Hs curls 25lb 2x10 Leg Ext 5lb 2x10 Shoulder Rows & Ext green 2x10 Seated ab sets w/ Pball 2x10 Shoulder ER yellow 2x10 Seated OHP yellow ball 2x10  05/12/23 EVAL                                                                                                                                  PATIENT EDUCATION:  Education details: POC and HEP Person educated: Patient Education method: Explanation Education comprehension: verbalized understanding and returned demonstration  HOME EXERCISE PROGRAM: Access Code: [email or web address] URL: [email or web address] Date: 05/12/2023 Prepared by: Cassie Freer Exercises - Seated March  - 1 x daily - 7 x weekly - 2 sets - 10 reps - Seated Hip Abduction with Resistance  - 1 x daily - 7 x weekly - 2 sets -  10 reps - Seated Cervical Sidebending Stretch  - 1 x daily - 7 x weekly - 2 sets - 10 reps - 10 hold - Seated Cervical Flexion Stretch with Finger Support Behind Neck  - 1 x daily - 7 x weekly - 2 sets - 10 reps - 10 hold   ASSESSMENT:  CLINICAL IMPRESSION: Patient is a 73 y.o. female who was seen today for physical therapy treatment for scoliosis. Forwards rounded shoulder at rest. Lead Pt assisted in  session providing Pt with DN. Increase reps and or weight  tolerated with machine level interventions. Tactiel cues needed to squeeze shoulder together with ER. Pt will benefit from skilled PT to work on posture, decrease pain, and address weakness in her hip flexors.   OBJECTIVE IMPAIRMENTS: Abnormal gait, difficulty walking, decreased ROM, decreased strength, and pain.   ACTIVITY LIMITATIONS: carrying, lifting, bending, sitting, standing, and stairs  PARTICIPATION LIMITATIONS: cleaning, laundry, shopping, community activity, and yard work  PERSONAL FACTORS: Age, Behavior pattern, and Past/current experiences are also affecting patient's functional outcome.   REHAB POTENTIAL: Good  CLINICAL DECISION MAKING: Stable/uncomplicated  EVALUATION COMPLEXITY: Low  GOALS: Goals reviewed with patient? No  SHORT TERM GOALS: Target date: 05/26/23  Patient will be independent with initial HEP.  Baseline: given 05/12/23 Goal status: Met 06/02/23  LONG TERM GOALS: Target date: 06/09/23  Patient will be independent with advanced/ongoing HEP to improve outcomes and carryover.  Baseline:  Goal status: INITIAL  2.  Patient will report 50-75% improvement in neck/shoulder pain to improve QOL.  Baseline: 7/10 (05/12/23) Goal status: INITIAL  3.  Patient will demonstrate full pain free lumbar ROM to perform ADLs.   Baseline: see chart above Goal status: INITIAL  4.  Patient will be able to carry a case of water and groceries into the house without pain  Baseline: unable to (05/12/23) Goal status: Carries 2 bags at a time, does not carry water 06/02/23  5.  Patient will be able to mop house without reporting pain. Baseline: reports pain with mopping Goal status: Gets her son to do it 06/02/23  6.  Patient to demonstrate ability to achieve and maintain good spinal alignment/posturing and body mechanics needed for daily activities. Baseline: rounded shoulders, forward head, increased thoracic kyphosis,  scoliosis Goal status: INITIAL  PLAN:  PT FREQUENCY: 1x/week  PT DURATION: 4 weeks  PLANNED INTERVENTIONS: 97110-Therapeutic exercises, 97530- Therapeutic activity, 97112- Neuromuscular re-education, 97535- Self Care, 84696- Manual therapy, Dry Needling, Cryotherapy, and Moist heat.  PLAN FOR NEXT SESSION: neck stretching to address, dry needling to neck, address posture   Cassie Freer, PT 06/22/2023, 4:20 PM

## 2023-06-23 ENCOUNTER — Ambulatory Visit

## 2023-06-24 ENCOUNTER — Encounter: Payer: Self-pay | Admitting: Physical Therapy

## 2023-06-24 ENCOUNTER — Ambulatory Visit: Attending: Orthopedic Surgery | Admitting: Physical Therapy

## 2023-06-24 DIAGNOSIS — R252 Cramp and spasm: Secondary | ICD-10-CM | POA: Diagnosis present

## 2023-06-24 DIAGNOSIS — R293 Abnormal posture: Secondary | ICD-10-CM

## 2023-06-24 DIAGNOSIS — M5459 Other low back pain: Secondary | ICD-10-CM | POA: Diagnosis present

## 2023-06-24 DIAGNOSIS — M546 Pain in thoracic spine: Secondary | ICD-10-CM | POA: Diagnosis present

## 2023-06-24 DIAGNOSIS — M4122 Other idiopathic scoliosis, cervical region: Secondary | ICD-10-CM | POA: Diagnosis present

## 2023-06-24 DIAGNOSIS — M542 Cervicalgia: Secondary | ICD-10-CM

## 2023-06-24 DIAGNOSIS — M6281 Muscle weakness (generalized): Secondary | ICD-10-CM | POA: Diagnosis present

## 2023-06-24 NOTE — Therapy (Signed)
 OUTPATIENT PHYSICAL THERAPY THORACOLUMBAR TREATMENT   Patient Name: Vanessa Blair MRN: [id number] DOB:06/24/1950, 73 y.o., female Today's Date: 06/24/2023  END OF SESSION:  PT End of Session - 06/24/23 1744     Visit Number 5    Date for PT Re-Evaluation 07/25/23    Authorization Type [employer] medicare    PT Start Time 1739    PT Stop Time 1819    PT Time Calculation (min) 40 min    Activity Tolerance Patient tolerated treatment well    Behavior During Therapy WFL for tasks assessed/performed             Past Medical History:  Diagnosis Date   Acid reflux    Anemia    Anxiety    Arthritis    Hiatal hernia    Hypercholesteremia    Hypertension    Past Surgical History:  Procedure Laterality Date   BREAST SURGERY     NASAL SINUS SURGERY     TOTAL HIP ARTHROPLASTY     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   Procedure: TOTAL KNEE ARTHROPLASTY;  Surgeon: Ollen Gross, MD;  Location: [facility];  Service: Orthopedics;  Laterality: Left;   TUBAL LIGATION     Patient Active Problem List   Diagnosis Date Noted   OA (osteoarthritis) of knee 07/18/2019   Primary osteoarthritis of left knee 07/18/2019    PCP: Dr. Ardyth Gal  REFERRING PROVIDER:  Dr. Venita Lick  REFERRING DIAG: Scoliosis  Rationale for Evaluation and Treatment: Rehabilitation  THERAPY DIAG:  Abnormal posture  Muscle weakness (generalized)  Pain in thoracic spine  Neck pain  Other idiopathic scoliosis, cervical region  Other low back pain  Cramp and spasm  ONSET DATE: not specified  SUBJECTIVE:                                                                                                                                                                                           SUBJECTIVE STATEMENT: Patient hs been out for over 3 weeks, reports that she got the flu and felt terrible, she reports that she did a lot of coughing and feels like the pain in the neck and the rhomboids are much  worse with significant tightness  PERTINENT HISTORY:  L TKA (07/27/2019) THA OA Osteoporosis Hypertension  PAIN:  Are you having pain? Yes: NPRS scale: 8/10 Pain location: mainly in the shoulders Pain description: dull Aggravating factors: lifting heavy objects (groceries) Relieving factors: biofreeze  PRECAUTIONS: None  RED FLAGS: None   WEIGHT BEARING RESTRICTIONS: No  FALLS:  Has patient fallen in last 6 months? No  LIVING ENVIRONMENT: Lives with:  lives alone Lives in: House/apartment Stairs: No Has following equipment at home: None  OCCUPATION: retired  PLOF: Independent  PATIENT GOALS: cleaning with no pain (mopping), lifting 24 pack of water  NEXT MD VISIT: none scheduled  OBJECTIVE:  Note: Objective measures were completed at Evaluation unless otherwise noted.  DIAGNOSTIC FINDINGS:  none  COGNITION: Overall cognitive status: Within functional limits for tasks assessed     SENSATION: WFL  POSTURE: rounded shoulders, forward head, decreased lumbar lordosis, and increased thoracic kyphosis  PALPATION: CPAs: C7-T3 were hypomobile, painful; only C7 got better with repeated motions  LUMBAR ROM:   AROM eval  Flexion 100%*  Extension 50%*  Right lateral flexion 50%*  Left lateral flexion 50%*  Right rotation 50%*  Left rotation 50%*   (Blank rows = not tested, * = pain)  LOWER EXTREMITY ROM:   grossly WFL BLE  Cervical ROM: Grossly WFL, pain with right side bend (pain felt on left side), left rotation, flexion and extension   LOWER EXTREMITY MMT:    MMT Right eval Left eval  Hip flexion 4-/5 4-/5  Hip extension    Hip abduction 4 4  Hip adduction 4- 4-  Hip internal rotation    Hip external rotation    Knee flexion    Knee extension    Ankle dorsiflexion    Ankle plantarflexion    Ankle inversion    Ankle eversion     (Blank rows = not tested)  FUNCTIONAL TESTS:  5 times sit to stand: 20.67 Timed up and go (TUG):  12.04  GAIT: Distance walked: in clinic distances Assistive device utilized: None Level of assistance: Complete Independence Comments: knee valgus   TREATMENT DATE: 06/24/23 Nustep level 5 x 6 minutes Back to wall red tband rows  Red tband extension Yellow tband horizontal abduction Back to wall postural holds Trigger Point Dry Needling  Subsequent Treatment: Instructions provided previously at initial dry needling treatment.   Patient Verbal Consent Given: Yes Education Handout Provided: Previously Provided Muscles Treated: left rhomboids, left upper trap Treatment Response/Outcome: LTR and "feels looser" STM to the left upper trap, neck and rhomboids   06/02/23 NuStep L5 x 6 min Trigger Point Dry Needling  Initial Treatment: Pt instructed on Dry Needling rational, procedures, and possible side effects. Pt instructed to expect mild to moderate muscle soreness later in the day and/or into the next day.  Pt instructed in methods to reduce muscle soreness. Pt instructed to continue prescribed HEP. Because Dry Needling was performed over or adjacent to a lung field, pt was educated on S/S of pneumothorax and to seek immediate medical attention should they occur.  Patient was educated on signs and symptoms of infection and other risk factors and advised to seek medical attention should they occur.  Patient verbalized understanding of these instructions and education.   Patient Verbal Consent Given: Yes Education Handout Provided: Yes Muscles Treated: bilateral Upper traps Treatment Response/Outcome: LTR's DN performed and documented by Stacie Glaze, PT Hs curls 25lb 2x12 Leg Ext 10lb 2x10 Horiz abd red 2x10 Shoulder ER red 2x10  MHP to cervical spine & UT   05/26/23 NuStep L5 x 6 min S2S OHP w/ yellow ball 2x10 Horix abd red 2x10 Hs curls 25lb 2x12 Leg Ext 10lb 2x10 Shoulder Ext 10lb 2x10  05/19/23 NuStep L5 x 6 min Hs curls 25lb 2x10 Leg Ext 5lb 2x10 Shoulder  Rows & Ext green 2x10 Seated ab sets w/ Pball 2x10 Shoulder ER yellow 2x10 Seated OHP  yellow ball 2x10  05/12/23 EVAL                                                                                                                                  PATIENT EDUCATION:  Education details: POC and HEP Person educated: Patient Education method: Explanation Education comprehension: verbalized understanding and returned demonstration  HOME EXERCISE PROGRAM: Access Code: [email or web address] URL: [email or web address] Date: 05/12/2023 Prepared by: Cassie Freer Exercises - Seated March  - 1 x daily - 7 x weekly - 2 sets - 10 reps - Seated Hip Abduction with Resistance  - 1 x daily - 7 x weekly - 2 sets - 10 reps - Seated Cervical Sidebending Stretch  - 1 x daily - 7 x weekly - 2 sets - 10 reps - 10 hold - Seated Cervical Flexion Stretch with Finger Support Behind Neck  - 1 x daily - 7 x weekly - 2 sets - 10 reps - 10 hold   ASSESSMENT:  CLINICAL IMPRESSION: Patient is a 73 y.o. female who was seen today for physical therapy treatment for scoliosis. Forwards rounded shoulder at rest. Patient got the flue and was out for 3 weeks, she reports that she had a lot of coughing and is in more pain now, has pain, tightness and some knots in the rhomboids and the neck and upper traps.  Pt will benefit from skilled PT to work on posture, decrease pain, and address weakness in her hip flexors.   OBJECTIVE IMPAIRMENTS: Abnormal gait, difficulty walking, decreased ROM, decreased strength, and pain.   ACTIVITY LIMITATIONS: carrying, lifting, bending, sitting, standing, and stairs  PARTICIPATION LIMITATIONS: cleaning, laundry, shopping, community activity, and yard work  PERSONAL FACTORS: Age, Behavior pattern, and Past/current experiences are also affecting patient's functional outcome.   REHAB POTENTIAL: Good  CLINICAL DECISION MAKING: Stable/uncomplicated  EVALUATION COMPLEXITY: Low  GOALS: Goals  reviewed with patient? No  SHORT TERM GOALS: Target date: 05/26/23  Patient will be independent with initial HEP.  Baseline: given 05/12/23 Goal status: Met 06/02/23  LONG TERM GOALS: Target date: 06/09/23  Patient will be independent with advanced/ongoing HEP to improve outcomes and carryover.  Baseline:  Goal status: ongoing 06/24/23  2.  Patient will report 50-75% improvement in neck/shoulder pain to improve QOL.  Baseline: 7/10 (05/12/23) Goal status: ongoing 06/24/23  3.  Patient will demonstrate full pain free lumbar ROM to perform ADLs.   Baseline: see chart above Goal status: ongoing 06/24/23  4.  Patient will be able to carry a case of water and groceries into the house without pain  Baseline: unable to (05/12/23) Goal status: Carries 2 bags at a time, does not carry water 06/02/23  5.  Patient will be able to mop house without reporting pain. Baseline: reports pain with mopping Goal status: Gets her son to do it 06/02/23  6.  Patient to demonstrate ability to achieve and maintain good spinal alignment/posturing and body mechanics  needed for daily activities. Baseline: rounded shoulders, forward head, increased thoracic kyphosis, scoliosis Goal status:ongoing 06/24/23  PLAN:  PT FREQUENCY: 1x/week  PT DURATION: 4 weeks  PLANNED INTERVENTIONS: 97110-Therapeutic exercises, 97530- Therapeutic activity, 97112- Neuromuscular re-education, 97535- Self Care, [id number]- Manual therapy, Dry Needling, Cryotherapy, and Moist heat.  PLAN FOR NEXT SESSION: neck stretching to address, dry needling to neck, address posture   Amram Maya W, PT 06/24/2023, 6:04 PM

## 2023-06-29 ENCOUNTER — Ambulatory Visit: Admitting: Physical Therapy

## 2023-06-29 ENCOUNTER — Encounter: Payer: Self-pay | Admitting: Physical Therapy

## 2023-06-29 DIAGNOSIS — M5459 Other low back pain: Secondary | ICD-10-CM

## 2023-06-29 DIAGNOSIS — M546 Pain in thoracic spine: Secondary | ICD-10-CM

## 2023-06-29 DIAGNOSIS — R293 Abnormal posture: Secondary | ICD-10-CM | POA: Diagnosis not present

## 2023-06-29 DIAGNOSIS — M542 Cervicalgia: Secondary | ICD-10-CM

## 2023-06-29 DIAGNOSIS — M6281 Muscle weakness (generalized): Secondary | ICD-10-CM

## 2023-06-29 NOTE — Therapy (Signed)
 OUTPATIENT PHYSICAL THERAPY THORACOLUMBAR TREATMENT   Patient Name: Vanessa Blair MRN: [id number] DOB:1950/09/13, 73 y.o., female Today's Date: 06/29/2023  END OF SESSION:  PT End of Session - 06/29/23 1444     Visit Number 6    Date for PT Re-Evaluation 07/25/23    Authorization Type [employer] medicare    PT Start Time 1444    PT Stop Time 1529    PT Time Calculation (min) 45 min    Activity Tolerance Patient tolerated treatment well    Behavior During Therapy WFL for tasks assessed/performed             Past Medical History:  Diagnosis Date   Acid reflux    Anemia    Anxiety    Arthritis    Hiatal hernia    Hypercholesteremia    Hypertension    Past Surgical History:  Procedure Laterality Date   BREAST SURGERY     NASAL SINUS SURGERY     TOTAL HIP ARTHROPLASTY     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   Procedure: TOTAL KNEE ARTHROPLASTY;  Surgeon: Ollen Gross, MD;  Location: [facility];  Service: Orthopedics;  Laterality: Left;   TUBAL LIGATION     Patient Active Problem List   Diagnosis Date Noted   OA (osteoarthritis) of knee 07/18/2019   Primary osteoarthritis of left knee 07/18/2019    PCP: Dr. Ardyth Gal  REFERRING PROVIDER:  Dr. Venita Lick  REFERRING DIAG: Scoliosis  Rationale for Evaluation and Treatment: Rehabilitation  THERAPY DIAG:  Abnormal posture  Muscle weakness (generalized)  Pain in thoracic spine  Neck pain  Other low back pain  ONSET DATE: not specified  SUBJECTIVE:                                                                                                                                                                                           SUBJECTIVE STATEMENT: Patient reports that she is a little sore and tender in the neck and traps  PERTINENT HISTORY:  L TKA (07/27/2019) THA OA Osteoporosis Hypertension  PAIN:  Are you having pain? Yes: NPRS scale: 8/10 Pain location: mainly in the shoulders Pain  description: dull Aggravating factors: lifting heavy objects (groceries) Relieving factors: biofreeze  PRECAUTIONS: None  RED FLAGS: None   WEIGHT BEARING RESTRICTIONS: No  FALLS:  Has patient fallen in last 6 months? No  LIVING ENVIRONMENT: Lives with: lives alone Lives in: House/apartment Stairs: No Has following equipment at home: None  OCCUPATION: retired  PLOF: Independent  PATIENT GOALS: cleaning with no pain (mopping), lifting 24 pack of water  NEXT MD VISIT: none scheduled  OBJECTIVE:  Note: Objective measures were completed at Evaluation unless otherwise noted.  DIAGNOSTIC FINDINGS:  none  COGNITION: Overall cognitive status: Within functional limits for tasks assessed     SENSATION: WFL  POSTURE: rounded shoulders, forward head, decreased lumbar lordosis, and increased thoracic kyphosis  PALPATION: CPAs: C7-T3 were hypomobile, painful; only C7 got better with repeated motions  LUMBAR ROM:   AROM eval  Flexion 100%*  Extension 50%*  Right lateral flexion 50%*  Left lateral flexion 50%*  Right rotation 50%*  Left rotation 50%*   (Blank rows = not tested, * = pain)  LOWER EXTREMITY ROM:   grossly WFL BLE  Cervical ROM: Grossly WFL, pain with right side bend (pain felt on left side), left rotation, flexion and extension   LOWER EXTREMITY MMT:    MMT Right eval Left eval  Hip flexion 4-/5 4-/5  Hip extension    Hip abduction 4 4  Hip adduction 4- 4-  Hip internal rotation    Hip external rotation    Knee flexion    Knee extension    Ankle dorsiflexion    Ankle plantarflexion    Ankle inversion    Ankle eversion     (Blank rows = not tested)  FUNCTIONAL TESTS:  5 times sit to stand: 20.67 Timed up and go (TUG): 12.04  GAIT: Distance walked: in clinic distances Assistive device utilized: None Level of assistance: Complete Independence Comments: knee valgus   TREATMENT DATE: 06/29/23 Nustep level 5 x 6 minutes 15# row  2x10 15# lats 2x10 Red tband ER Red tband horizontal abduction 4# shrugs with upper trap and levator stretches Postural holds with back to wall STM to the neck, upper traps and the rhomboids  06/24/23 Nustep level 5 x 6 minutes Back to wall red tband rows  Red tband extension Yellow tband horizontal abduction Back to wall postural holds Trigger Point Dry Needling  Subsequent Treatment: Instructions provided previously at initial dry needling treatment.   Patient Verbal Consent Given: Yes Education Handout Provided: Previously Provided Muscles Treated: left rhomboids, left upper trap Treatment Response/Outcome: LTR and "feels looser" STM to the left upper trap, neck and rhomboids   06/02/23 NuStep L5 x 6 min Trigger Point Dry Needling  Initial Treatment: Pt instructed on Dry Needling rational, procedures, and possible side effects. Pt instructed to expect mild to moderate muscle soreness later in the day and/or into the next day.  Pt instructed in methods to reduce muscle soreness. Pt instructed to continue prescribed HEP. Because Dry Needling was performed over or adjacent to a lung field, pt was educated on S/S of pneumothorax and to seek immediate medical attention should they occur.  Patient was educated on signs and symptoms of infection and other risk factors and advised to seek medical attention should they occur.  Patient verbalized understanding of these instructions and education.   Patient Verbal Consent Given: Yes Education Handout Provided: Yes Muscles Treated: bilateral Upper traps Treatment Response/Outcome: LTR's DN performed and documented by Stacie Glaze, PT Hs curls 25lb 2x12 Leg Ext 10lb 2x10 Horiz abd red 2x10 Shoulder ER red 2x10  MHP to cervical spine & UT   05/26/23 NuStep L5 x 6 min S2S OHP w/ yellow ball 2x10 Horix abd red 2x10 Hs curls 25lb 2x12 Leg Ext 10lb 2x10 Shoulder Ext 10lb 2x10  05/19/23 NuStep L5 x 6 min Hs curls 25lb  2x10 Leg Ext 5lb 2x10 Shoulder Rows & Ext green 2x10 Seated ab sets w/ Pball 2x10 Shoulder  ER yellow 2x10 Seated OHP yellow ball 2x10  05/12/23 EVAL                                                                                                                                  PATIENT EDUCATION:  Education details: POC and HEP Person educated: Patient Education method: Explanation Education comprehension: verbalized understanding and returned demonstration  HOME EXERCISE PROGRAM: Access Code: [email or web address] URL: [email or web address] Date: 05/12/2023 Prepared by: Cassie Freer Exercises - Seated March  - 1 x daily - 7 x weekly - 2 sets - 10 reps - Seated Hip Abduction with Resistance  - 1 x daily - 7 x weekly - 2 sets - 10 reps - Seated Cervical Sidebending Stretch  - 1 x daily - 7 x weekly - 2 sets - 10 reps - 10 hold - Seated Cervical Flexion Stretch with Finger Support Behind Neck  - 1 x daily - 7 x weekly - 2 sets - 10 reps - 10 hold   ASSESSMENT:  CLINICAL IMPRESSION: Patient is a 73 y.o. female who was seen today for physical therapy treatment for scoliosis. She reports that last week was good after the treatment had some soreness but then felt better.  Trying to add exercises with some continued cues on posture.  Pt will benefit from skilled PT to work on posture, decrease pain, and address weakness in her hip flexors.   OBJECTIVE IMPAIRMENTS: Abnormal gait, difficulty walking, decreased ROM, decreased strength, and pain.   ACTIVITY LIMITATIONS: carrying, lifting, bending, sitting, standing, and stairs  PARTICIPATION LIMITATIONS: cleaning, laundry, shopping, community activity, and yard work  PERSONAL FACTORS: Age, Behavior pattern, and Past/current experiences are also affecting patient's functional outcome.   REHAB POTENTIAL: Good  CLINICAL DECISION MAKING: Stable/uncomplicated  EVALUATION COMPLEXITY: Low  GOALS: Goals reviewed with patient? No  SHORT TERM  GOALS: Target date: 05/26/23  Patient will be independent with initial HEP.  Baseline: given 05/12/23 Goal status: Met 06/02/23  LONG TERM GOALS: Target date: 06/09/23  Patient will be independent with advanced/ongoing HEP to improve outcomes and carryover.  Baseline:  Goal status: ongoing 06/24/23  2.  Patient will report 50-75% improvement in neck/shoulder pain to improve QOL.  Baseline: 7/10 (05/12/23) Goal status: ongoing 06/24/23  3.  Patient will demonstrate full pain free lumbar ROM to perform ADLs.   Baseline: see chart above Goal status: ongoing 06/24/23  4.  Patient will be able to carry a case of water and groceries into the house without pain  Baseline: unable to (05/12/23) Goal status: Carries 2 bags at a time, does not carry water 06/02/23  5.  Patient will be able to mop house without reporting pain. Baseline: reports pain with mopping Goal status: Gets her son to do it 06/02/23  6.  Patient to demonstrate ability to achieve and maintain good spinal alignment/posturing and body mechanics needed for daily activities. Baseline: rounded shoulders, forward head, increased thoracic kyphosis,  scoliosis Goal status:ongoing 06/24/23  PLAN:  PT FREQUENCY: 1x/week  PT DURATION: 4 weeks  PLANNED INTERVENTIONS: 97110-Therapeutic exercises, 97530- Therapeutic activity, 97112- Neuromuscular re-education, 97535- Self Care, [id number]- Manual therapy, Dry Needling, Cryotherapy, and Moist heat.  PLAN FOR NEXT SESSION: neck stretching to address, dry needling to neck, address posture   Cecilia Nishikawa W, PT 06/29/2023, 2:45 PM

## 2023-06-30 ENCOUNTER — Ambulatory Visit

## 2023-07-08 ENCOUNTER — Encounter: Payer: Self-pay | Admitting: Physical Therapy

## 2023-07-08 ENCOUNTER — Ambulatory Visit: Attending: Orthopedic Surgery | Admitting: Physical Therapy

## 2023-07-08 DIAGNOSIS — M5459 Other low back pain: Secondary | ICD-10-CM | POA: Diagnosis present

## 2023-07-08 DIAGNOSIS — M6281 Muscle weakness (generalized): Secondary | ICD-10-CM | POA: Insufficient documentation

## 2023-07-08 DIAGNOSIS — M542 Cervicalgia: Secondary | ICD-10-CM | POA: Insufficient documentation

## 2023-07-08 DIAGNOSIS — R293 Abnormal posture: Secondary | ICD-10-CM | POA: Insufficient documentation

## 2023-07-08 DIAGNOSIS — M546 Pain in thoracic spine: Secondary | ICD-10-CM | POA: Insufficient documentation

## 2023-07-08 NOTE — Therapy (Signed)
 OUTPATIENT PHYSICAL THERAPY THORACOLUMBAR TREATMENT   Patient Name: Vanessa Blair MRN: [id number] DOB:[birth year], 73 y.o., female Today's Date: 07/08/2023  END OF SESSION:  PT End of Session - 07/08/23 1618     Visit Number 7    Date for PT Re-Evaluation 07/25/23    Authorization Type [employer] medicare    PT Start Time 1616    PT Stop Time 1700    PT Time Calculation (min) 44 min    Activity Tolerance Patient tolerated treatment well    Behavior During Therapy WFL for tasks assessed/performed             Past Medical History:  Diagnosis Date   Acid reflux    Anemia    Anxiety    Arthritis    Hiatal hernia    Hypercholesteremia    Hypertension    Past Surgical History:  Procedure Laterality Date   BREAST SURGERY     NASAL SINUS SURGERY     TOTAL HIP ARTHROPLASTY     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   Procedure: TOTAL KNEE ARTHROPLASTY;  Surgeon: Ollen Gross, MD;  Location: [facility];  Service: Orthopedics;  Laterality: Left;   TUBAL LIGATION     Patient Active Problem List   Diagnosis Date Noted   OA (osteoarthritis) of knee 07/18/2019   Primary osteoarthritis of left knee 07/18/2019    PCP: Dr. Ardyth Gal  REFERRING PROVIDER:  Dr. Venita Lick  REFERRING DIAG: Scoliosis  Rationale for Evaluation and Treatment: Rehabilitation  THERAPY DIAG:  Abnormal posture  Muscle weakness (generalized)  Pain in thoracic spine  Neck pain  ONSET DATE: not specified  SUBJECTIVE:                                                                                                                                                                                           SUBJECTIVE STATEMENT: Has a hear monitor on today, reports let's take it easy, reports doing some of the HEP  PERTINENT HISTORY:  L TKA (07/27/2019) THA OA Osteoporosis Hypertension  PAIN:  Are you having pain? Yes: NPRS scale: 8/10 Pain location: mainly in the shoulders Pain description:  dull Aggravating factors: lifting heavy objects (groceries) Relieving factors: biofreeze  PRECAUTIONS: None  RED FLAGS: None   WEIGHT BEARING RESTRICTIONS: No  FALLS:  Has patient fallen in last 6 months? No  LIVING ENVIRONMENT: Lives with: lives alone Lives in: House/apartment Stairs: No Has following equipment at home: None  OCCUPATION: retired  PLOF: Independent  PATIENT GOALS: cleaning with no pain (mopping), lifting 24 pack of water  NEXT MD VISIT: none scheduled  OBJECTIVE:  Note: Objective measures were completed at Evaluation unless otherwise noted.  DIAGNOSTIC FINDINGS:  none  COGNITION: Overall cognitive status: Within functional limits for tasks assessed     SENSATION: WFL  POSTURE: rounded shoulders, forward head, decreased lumbar lordosis, and increased thoracic kyphosis  PALPATION: CPAs: C7-T3 were hypomobile, painful; only C7 got better with repeated motions  LUMBAR ROM:   AROM eval  Flexion 100%*  Extension 50%*  Right lateral flexion 50%*  Left lateral flexion 50%*  Right rotation 50%*  Left rotation 50%*   (Blank rows = not tested, * = pain)  LOWER EXTREMITY ROM:   grossly WFL BLE  Cervical ROM: Grossly WFL, pain with right side bend (pain felt on left side), left rotation, flexion and extension   LOWER EXTREMITY MMT:    MMT Right eval Left eval  Hip flexion 4-/5 4-/5  Hip extension    Hip abduction 4 4  Hip adduction 4- 4-  Hip internal rotation    Hip external rotation    Knee flexion    Knee extension    Ankle dorsiflexion    Ankle plantarflexion    Ankle inversion    Ankle eversion     (Blank rows = not tested)  FUNCTIONAL TESTS:  5 times sit to stand: 20.67 Timed up and go (TUG): 12.04  GAIT: Distance walked: in clinic distances Assistive device utilized: None Level of assistance: Complete Independence Comments: knee valgus   TREATMENT DATE: 07/08/23 UBE level 3 x 6 minutes Back to wall red tband ER Red  tband horizontal abduction 15# rows 15# lats Ball in lap isometrics Nustep level 4 x 5 minutes 3# shrugs with upper trap and levator stretches STM to the upper trap, neck and rhomboids  06/29/23 Nustep level 5 x 6 minutes 15# row 2x10 15# lats 2x10 Red tband ER Red tband horizontal abduction 4# shrugs with upper trap and levator stretches Postural holds with back to wall STM to the neck, upper traps and the rhomboids  06/24/23 Nustep level 5 x 6 minutes Back to wall red tband rows  Red tband extension Yellow tband horizontal abduction Back to wall postural holds Trigger Point Dry Needling  Subsequent Treatment: Instructions provided previously at initial dry needling treatment.   Patient Verbal Consent Given: Yes Education Handout Provided: Previously Provided Muscles Treated: left rhomboids, left upper trap Treatment Response/Outcome: LTR and "feels looser" STM to the left upper trap, neck and rhomboids   06/02/23 NuStep L5 x 6 min Trigger Point Dry Needling  Initial Treatment: Pt instructed on Dry Needling rational, procedures, and possible side effects. Pt instructed to expect mild to moderate muscle soreness later in the day and/or into the next day.  Pt instructed in methods to reduce muscle soreness. Pt instructed to continue prescribed HEP. Because Dry Needling was performed over or adjacent to a lung field, pt was educated on S/S of pneumothorax and to seek immediate medical attention should they occur.  Patient was educated on signs and symptoms of infection and other risk factors and advised to seek medical attention should they occur.  Patient verbalized understanding of these instructions and education.   Patient Verbal Consent Given: Yes Education Handout Provided: Yes Muscles Treated: bilateral Upper traps Treatment Response/Outcome: LTR's DN performed and documented by Stacie Glaze, PT Hs curls 25lb 2x12 Leg Ext 10lb 2x10 Horiz abd red  2x10 Shoulder ER red 2x10  MHP to cervical spine & UT   05/26/23 NuStep L5 x 6 min S2S OHP w/ yellow  ball 2x10 Horix abd red 2x10 Hs curls 25lb 2x12 Leg Ext 10lb 2x10 Shoulder Ext 10lb 2x10  05/19/23 NuStep L5 x 6 min Hs curls 25lb 2x10 Leg Ext 5lb 2x10 Shoulder Rows & Ext green 2x10 Seated ab sets w/ Pball 2x10 Shoulder ER yellow 2x10 Seated OHP yellow ball 2x10  05/12/23 EVAL                                                                                                                                  PATIENT EDUCATION:  Education details: POC and HEP Person educated: Patient Education method: Explanation Education comprehension: verbalized understanding and returned demonstration  HOME EXERCISE PROGRAM: Access Code: [email or web address] URL: [email or web address] Date: 05/12/2023 Prepared by: Cassie Freer Exercises - Seated March  - 1 x daily - 7 x weekly - 2 sets - 10 reps - Seated Hip Abduction with Resistance  - 1 x daily - 7 x weekly - 2 sets - 10 reps - Seated Cervical Sidebending Stretch  - 1 x daily - 7 x weekly - 2 sets - 10 reps - 10 hold - Seated Cervical Flexion Stretch with Finger Support Behind Neck  - 1 x daily - 7 x weekly - 2 sets - 10 reps - 10 hold   ASSESSMENT:  CLINICAL IMPRESSION: Patient is a 73 y.o. female who was seen today for physical therapy treatment for scoliosis. She has a heart monitor on today and wanted to go easy, we continued the postural stability and some core activities, no difficulty with this. Pt will benefit from skilled PT to work on posture, decrease pain, and address weakness in her hip flexors.   OBJECTIVE IMPAIRMENTS: Abnormal gait, difficulty walking, decreased ROM, decreased strength, and pain.   ACTIVITY LIMITATIONS: carrying, lifting, bending, sitting, standing, and stairs  PARTICIPATION LIMITATIONS: cleaning, laundry, shopping, community activity, and yard work  PERSONAL FACTORS: Age, Behavior pattern, and Past/current  experiences are also affecting patient's functional outcome.   REHAB POTENTIAL: Good  CLINICAL DECISION MAKING: Stable/uncomplicated  EVALUATION COMPLEXITY: Low  GOALS: Goals reviewed with patient? No  SHORT TERM GOALS: Target date: 05/26/23  Patient will be independent with initial HEP.  Baseline: given 05/12/23 Goal status: Met 06/02/23  LONG TERM GOALS: Target date: 06/09/23  Patient will be independent with advanced/ongoing HEP to improve outcomes and carryover.  Baseline:  Goal status: ongoing 06/24/23  2.  Patient will report 50-75% improvement in neck/shoulder pain to improve QOL.  Baseline: 7/10 (05/12/23) Goal status: ongoing 06/24/23  3.  Patient will demonstrate full pain free lumbar ROM to perform ADLs.   Baseline: see chart above Goal status: ongoing 06/24/23  4.  Patient will be able to carry a case of water and groceries into the house without pain  Baseline: unable to (05/12/23) Goal status: Carries 2 bags at a time, does not carry water 06/02/23  5.  Patient will be able to mop house without reporting pain. Baseline:  reports pain with mopping Goal status: Gets her son to do it 06/02/23  6.  Patient to demonstrate ability to achieve and maintain good spinal alignment/posturing and body mechanics needed for daily activities. Baseline: rounded shoulders, forward head, increased thoracic kyphosis, scoliosis Goal status: progressing 07/08/23  PLAN:  PT FREQUENCY: 1x/week  PT DURATION: 4 weeks  PLANNED INTERVENTIONS: 97110-Therapeutic exercises, 97530- Therapeutic activity, 97112- Neuromuscular re-education, 97535- Self Care, [id number]- Manual therapy, Dry Needling, Cryotherapy, and Moist heat.  PLAN FOR NEXT SESSION: neck stretching to address, dry needling to neck, address posture   Netty Sullivant W, PT 07/08/2023, 4:21 PM

## 2023-07-16 ENCOUNTER — Ambulatory Visit: Admitting: Physical Therapy

## 2023-07-16 ENCOUNTER — Encounter: Payer: Self-pay | Admitting: Physical Therapy

## 2023-07-16 DIAGNOSIS — M5459 Other low back pain: Secondary | ICD-10-CM

## 2023-07-16 DIAGNOSIS — R293 Abnormal posture: Secondary | ICD-10-CM | POA: Diagnosis not present

## 2023-07-16 DIAGNOSIS — M542 Cervicalgia: Secondary | ICD-10-CM

## 2023-07-16 DIAGNOSIS — M546 Pain in thoracic spine: Secondary | ICD-10-CM

## 2023-07-16 DIAGNOSIS — M6281 Muscle weakness (generalized): Secondary | ICD-10-CM

## 2023-07-16 NOTE — Therapy (Signed)
 OUTPATIENT PHYSICAL THERAPY THORACOLUMBAR TREATMENT   Patient Name: Vanessa Blair MRN: [id number] DOB:Mar 11, 1951, 73 y.o., female Today's Date: 07/16/2023  END OF SESSION:  PT End of Session - 07/16/23 1317     Visit Number 8    Date for PT Re-Evaluation 07/25/23    Authorization Type [employer] medicare    PT Start Time 1315    PT Stop Time 1400    PT Time Calculation (min) 45 min    Activity Tolerance Patient tolerated treatment well    Behavior During Therapy WFL for tasks assessed/performed             Past Medical History:  Diagnosis Date   Acid reflux    Anemia    Anxiety    Arthritis    Hiatal hernia    Hypercholesteremia    Hypertension    Past Surgical History:  Procedure Laterality Date   BREAST SURGERY     NASAL SINUS SURGERY     TOTAL HIP ARTHROPLASTY     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   Procedure: TOTAL KNEE ARTHROPLASTY;  Surgeon: Ollen Gross, MD;  Location: [facility];  Service: Orthopedics;  Laterality: Left;   TUBAL LIGATION     Patient Active Problem List   Diagnosis Date Noted   OA (osteoarthritis) of knee 07/18/2019   Primary osteoarthritis of left knee 07/18/2019    PCP: Dr. Ardyth Gal  REFERRING PROVIDER:  Dr. Venita Lick  REFERRING DIAG: Scoliosis  Rationale for Evaluation and Treatment: Rehabilitation  THERAPY DIAG:  Abnormal posture  Muscle weakness (generalized)  Pain in thoracic spine  Neck pain  Other low back pain  ONSET DATE: not specified  SUBJECTIVE:                                                                                                                                                                                           SUBJECTIVE STATEMENT: REports doing a little better pain in the upper traps a 6/10  PERTINENT HISTORY:  L TKA (07/27/2019) THA OA Osteoporosis Hypertension  PAIN:  Are you having pain? Yes: NPRS scale: 6/10 Pain location: mainly in the shoulders Pain description:  dull Aggravating factors: lifting heavy objects (groceries) Relieving factors: biofreeze  PRECAUTIONS: None  RED FLAGS: None   WEIGHT BEARING RESTRICTIONS: No  FALLS:  Has patient fallen in last 6 months? No  LIVING ENVIRONMENT: Lives with: lives alone Lives in: House/apartment Stairs: No Has following equipment at home: None  OCCUPATION: retired  PLOF: Independent  PATIENT GOALS: cleaning with no pain (mopping), lifting 24 pack of water  NEXT MD VISIT: none scheduled  OBJECTIVE:  Note: Objective measures were completed at Evaluation unless otherwise noted.  DIAGNOSTIC FINDINGS:  none  COGNITION: Overall cognitive status: Within functional limits for tasks assessed     SENSATION: WFL  POSTURE: rounded shoulders, forward head, decreased lumbar lordosis, and increased thoracic kyphosis  PALPATION: CPAs: C7-T3 were hypomobile, painful; only C7 got better with repeated motions  LUMBAR ROM:   AROM eval  Flexion 100%*  Extension 50%*  Right lateral flexion 50%*  Left lateral flexion 50%*  Right rotation 50%*  Left rotation 50%*   (Blank rows = not tested, * = pain)  LOWER EXTREMITY ROM:   grossly WFL BLE  Cervical ROM: Grossly WFL, pain with right side bend (pain felt on left side), left rotation, flexion and extension   LOWER EXTREMITY MMT:    MMT Right eval Left eval  Hip flexion 4-/5 4-/5  Hip extension    Hip abduction 4 4  Hip adduction 4- 4-  Hip internal rotation    Hip external rotation    Knee flexion    Knee extension    Ankle dorsiflexion    Ankle plantarflexion    Ankle inversion    Ankle eversion     (Blank rows = not tested)  FUNCTIONAL TESTS:  5 times sit to stand: 20.67 Timed up and go (TUG): 12.04  GAIT: Distance walked: in clinic distances Assistive device utilized: None Level of assistance: Complete Independence Comments: knee valgus   TREATMENT DATE: 07/16/23 Nustep level 5 x 6 minutes UBE level 4 x 5  minutes 15# rows 2x10 15# lats 2x10 Red tband ER Red tband horizontal abduction 3# shrugs with upper trap and levator stretches STM to the upper traps, cervical area and into the rhomboids  07/08/23 UBE level 3 x 6 minutes Back to wall red tband ER Red tband horizontal abduction 15# rows 15# lats Ball in lap isometrics Nustep level 4 x 5 minutes 3# shrugs with upper trap and levator stretches STM to the upper trap, neck and rhomboids  06/29/23 Nustep level 5 x 6 minutes 15# row 2x10 15# lats 2x10 Red tband ER Red tband horizontal abduction 4# shrugs with upper trap and levator stretches Postural holds with back to wall STM to the neck, upper traps and the rhomboids  06/24/23 Nustep level 5 x 6 minutes Back to wall red tband rows  Red tband extension Yellow tband horizontal abduction Back to wall postural holds Trigger Point Dry Needling  Subsequent Treatment: Instructions provided previously at initial dry needling treatment.   Patient Verbal Consent Given: Yes Education Handout Provided: Previously Provided Muscles Treated: left rhomboids, left upper trap Treatment Response/Outcome: LTR and "feels looser" STM to the left upper trap, neck and rhomboids   06/02/23 NuStep L5 x 6 min Trigger Point Dry Needling  Initial Treatment: Pt instructed on Dry Needling rational, procedures, and possible side effects. Pt instructed to expect mild to moderate muscle soreness later in the day and/or into the next day.  Pt instructed in methods to reduce muscle soreness. Pt instructed to continue prescribed HEP. Because Dry Needling was performed over or adjacent to a lung field, pt was educated on S/S of pneumothorax and to seek immediate medical attention should they occur.  Patient was educated on signs and symptoms of infection and other risk factors and advised to seek medical attention should they occur.  Patient verbalized understanding of these instructions and education.    Patient Verbal Consent Given: Yes Education Handout Provided: Yes Muscles Treated: bilateral Upper traps  Treatment Response/Outcome: LTR's DN performed and documented by Stacie Glaze, PT Hs curls 25lb 2x12 Leg Ext 10lb 2x10 Horiz abd red 2x10 Shoulder ER red 2x10  MHP to cervical spine & UT   05/26/23 NuStep L5 x 6 min S2S OHP w/ yellow ball 2x10 Horix abd red 2x10 Hs curls 25lb 2x12 Leg Ext 10lb 2x10 Shoulder Ext 10lb 2x10  05/19/23 NuStep L5 x 6 min Hs curls 25lb 2x10 Leg Ext 5lb 2x10 Shoulder Rows & Ext green 2x10 Seated ab sets w/ Pball 2x10 Shoulder ER yellow 2x10 Seated OHP yellow ball 2x10  05/12/23 EVAL                                                                                                                                  PATIENT EDUCATION:  Education details: POC and HEP Person educated: Patient Education method: Explanation Education comprehension: verbalized understanding and returned demonstration  HOME EXERCISE PROGRAM: Access Code: [email or web address] URL: [email or web address] Date: 05/12/2023 Prepared by: Cassie Freer Exercises - Seated March  - 1 x daily - 7 x weekly - 2 sets - 10 reps - Seated Hip Abduction with Resistance  - 1 x daily - 7 x weekly - 2 sets - 10 reps - Seated Cervical Sidebending Stretch  - 1 x daily - 7 x weekly - 2 sets - 10 reps - 10 hold - Seated Cervical Flexion Stretch with Finger Support Behind Neck  - 1 x daily - 7 x weekly - 2 sets - 10 reps - 10 hold   ASSESSMENT:  CLINICAL IMPRESSION: Patient is a 73 y.o. female who was seen today for physical therapy treatment for scoliosis. She is doing a little better, but has a lot of spasms in the upper traps, reports that since she started PT she has had lees HA.  Needs some cues for posture and body mechanics.  Pt will benefit from skilled PT to work on posture, decrease pain, and address weakness in her hip flexors.   OBJECTIVE IMPAIRMENTS: Abnormal gait, difficulty  walking, decreased ROM, decreased strength, and pain.   ACTIVITY LIMITATIONS: carrying, lifting, bending, sitting, standing, and stairs  PARTICIPATION LIMITATIONS: cleaning, laundry, shopping, community activity, and yard work  PERSONAL FACTORS: Age, Behavior pattern, and Past/current experiences are also affecting patient's functional outcome.   REHAB POTENTIAL: Good  CLINICAL DECISION MAKING: Stable/uncomplicated  EVALUATION COMPLEXITY: Low  GOALS: Goals reviewed with patient? No  SHORT TERM GOALS: Target date: 05/26/23  Patient will be independent with initial HEP.  Baseline: given 05/12/23 Goal status: Met 06/02/23  LONG TERM GOALS: Target date: 06/09/23  Patient will be independent with advanced/ongoing HEP to improve outcomes and carryover.  Baseline:  Goal status: ongoing 06/24/23  2.  Patient will report 50-75% improvement in neck/shoulder pain to improve QOL.  Baseline: 7/10 (05/12/23) Goal status: progressing 07/16/23  3.  Patient will demonstrate full pain free lumbar ROM to perform ADLs.   Baseline: see  chart above Goal status: ongoing 06/24/23  4.  Patient will be able to carry a case of water and groceries into the house without pain  Baseline: unable to (05/12/23) Goal status: Carries 2 bags at a time, does not carry water 06/02/23  5.  Patient will be able to mop house without reporting pain. Baseline: reports pain with mopping Goal status: Gets her son to do it 06/02/23  6.  Patient to demonstrate ability to achieve and maintain good spinal alignment/posturing and body mechanics needed for daily activities. Baseline: rounded shoulders, forward head, increased thoracic kyphosis, scoliosis Goal status: progressing 07/08/23  PLAN:  PT FREQUENCY: 1x/week  PT DURATION: 4 weeks  PLANNED INTERVENTIONS: 97110-Therapeutic exercises, 97530- Therapeutic activity, 97112- Neuromuscular re-education, 97535- Self Care, 24401- Manual therapy, Dry Needling, Cryotherapy, and  Moist heat.  PLAN FOR NEXT SESSION: neck stretching to address, dry needling to neck, address posture   Rael Yo W, PT 07/16/2023, 1:18 PM

## 2023-07-21 ENCOUNTER — Ambulatory Visit: Admitting: Physical Therapy

## 2023-08-11 ENCOUNTER — Encounter: Payer: Self-pay | Admitting: Physical Therapy

## 2023-08-11 ENCOUNTER — Ambulatory Visit: Attending: Orthopedic Surgery | Admitting: Physical Therapy

## 2023-08-11 DIAGNOSIS — R293 Abnormal posture: Secondary | ICD-10-CM | POA: Diagnosis present

## 2023-08-11 DIAGNOSIS — M542 Cervicalgia: Secondary | ICD-10-CM | POA: Diagnosis present

## 2023-08-11 DIAGNOSIS — M6281 Muscle weakness (generalized): Secondary | ICD-10-CM | POA: Insufficient documentation

## 2023-08-11 DIAGNOSIS — M4122 Other idiopathic scoliosis, cervical region: Secondary | ICD-10-CM | POA: Insufficient documentation

## 2023-08-11 DIAGNOSIS — M5459 Other low back pain: Secondary | ICD-10-CM | POA: Diagnosis present

## 2023-08-11 DIAGNOSIS — M546 Pain in thoracic spine: Secondary | ICD-10-CM | POA: Insufficient documentation

## 2023-08-11 NOTE — Therapy (Signed)
 OUTPATIENT PHYSICAL THERAPY THORACOLUMBAR TREATMENT   Patient Name: Vanessa Blair MRN: [id number] DOB:01/11/1951, 73 y.o., female Today's Date: 08/11/2023  END OF SESSION:  PT End of Session - 08/11/23 1021     Visit Number 9    Date for PT Re-Evaluation 09/11/23    Authorization Type [employer] medicare    PT Start Time 1017    PT Stop Time 1100    PT Time Calculation (min) 43 min    Activity Tolerance Patient tolerated treatment well    Behavior During Therapy WFL for tasks assessed/performed             Past Medical History:  Diagnosis Date   Acid reflux    Anemia    Anxiety    Arthritis    Hiatal hernia    Hypercholesteremia    Hypertension    Past Surgical History:  Procedure Laterality Date   BREAST SURGERY     NASAL SINUS SURGERY     TOTAL HIP ARTHROPLASTY     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   Procedure: TOTAL KNEE ARTHROPLASTY;  Surgeon: Liliane Rei, MD;  Location: [facility];  Service: Orthopedics;  Laterality: Left;   TUBAL LIGATION     Patient Active Problem List   Diagnosis Date Noted   OA (osteoarthritis) of knee 07/18/2019   Primary osteoarthritis of left knee 07/18/2019    PCP: Dr. Dewitt Forehand  REFERRING PROVIDER:  Dr. Mort Ards  REFERRING DIAG: Scoliosis  Rationale for Evaluation and Treatment: Rehabilitation  THERAPY DIAG:  Abnormal posture  Muscle weakness (generalized)  Pain in thoracic spine  Neck pain  Other low back pain  ONSET DATE: not specified  SUBJECTIVE:                                                                                                                                                                                           SUBJECTIVE STATEMENT: [place] has not been in for about 3+ weeks, she reports could not get in and then got busy, reports that she is really hurting and feels very tight  PERTINENT HISTORY:  L TKA (07/27/2019) THA OA Osteoporosis Hypertension  PAIN:  Are you having pain?  Yes: NPRS scale: 6/10 Pain location: mainly in the shoulders Pain description: dull Aggravating factors: lifting heavy objects (groceries) Relieving factors: biofreeze  PRECAUTIONS: None  RED FLAGS: None   WEIGHT BEARING RESTRICTIONS: No  FALLS:  Has patient fallen in last 6 months? No  LIVING ENVIRONMENT: Lives with: lives alone Lives in: House/apartment Stairs: No Has following equipment at home: None  OCCUPATION: retired  PLOF: Independent  PATIENT GOALS: cleaning with  no pain (mopping), lifting 24 pack of water   NEXT MD VISIT: none scheduled  OBJECTIVE:  Note: Objective measures were completed at Evaluation unless otherwise noted.  DIAGNOSTIC FINDINGS:  none  COGNITION: Overall cognitive status: Within functional limits for tasks assessed     SENSATION: WFL  POSTURE: rounded shoulders, forward head, decreased lumbar lordosis, and increased thoracic kyphosis  PALPATION: CPAs: C7-T3 were hypomobile, painful; only C7 got better with repeated motions  LUMBAR ROM:   AROM eval 08/11/23  Flexion 100%* WNL  Extension 50%* Decreased 25%  Right lateral flexion 50%* 50%  Left lateral flexion 50%* 50%  Right rotation 50%* Still pain  Left rotation 50%* Still pain   (Blank rows = not tested, * = pain)  LOWER EXTREMITY ROM:   grossly WFL BLE  Cervical ROM: Grossly WFL, pain with right side bend (pain felt on left side), left rotation, flexion and extension   LOWER EXTREMITY MMT:    MMT Right eval Left eval  Hip flexion 4-/5 4-/5  Hip extension    Hip abduction 4 4  Hip adduction 4- 4-  Hip internal rotation    Hip external rotation    Knee flexion    Knee extension    Ankle dorsiflexion    Ankle plantarflexion    Ankle inversion    Ankle eversion     (Blank rows = not tested)  FUNCTIONAL TESTS:  5 times sit to stand: 20.67 Timed up and go (TUG): 12.04  GAIT: Distance walked: in clinic distances Assistive device utilized: None Level of  assistance: Complete Independence Comments: knee valgus   TREATMENT DATE: 08/11/23 UBE level 3 x 5 minutes Nustep level 5 x 6 minutes Shrugs Cervical and scapular retractions STM to the upper traps, cervical area into the rhomboids Gentle passive cervical stretching  07/16/23 Nustep level 5 x 6 minutes UBE level 4 x 5 minutes 15# rows 2x10 15# lats 2x10 Red tband ER Red tband horizontal abduction 3# shrugs with upper trap and levator stretches STM to the upper traps, cervical area and into the rhomboids  07/08/23 UBE level 3 x 6 minutes Back to wall red tband ER Red tband horizontal abduction 15# rows 15# lats Ball in lap isometrics Nustep level 4 x 5 minutes 3# shrugs with upper trap and levator stretches STM to the upper trap, neck and rhomboids  06/29/23 Nustep level 5 x 6 minutes 15# row 2x10 15# lats 2x10 Red tband ER Red tband horizontal abduction 4# shrugs with upper trap and levator stretches Postural holds with back to wall STM to the neck, upper traps and the rhomboids  06/24/23 Nustep level 5 x 6 minutes Back to wall red tband rows  Red tband extension Yellow tband horizontal abduction Back to wall postural holds Trigger Point Dry Needling  Subsequent Treatment: Instructions provided previously at initial dry needling treatment.   Patient Verbal Consent Given: Yes Education Handout Provided: Previously Provided Muscles Treated: left rhomboids, left upper trap Treatment Response/Outcome: LTR and "feels looser" STM to the left upper trap, neck and rhomboids   06/02/23 NuStep L5 x 6 min Trigger Point Dry Needling  Initial Treatment: Pt instructed on Dry Needling rational, procedures, and possible side effects. Pt instructed to expect mild to moderate muscle soreness later in the day and/or into the next day.  Pt instructed in methods to reduce muscle soreness. Pt instructed to continue prescribed HEP. Because Dry Needling was performed over or  adjacent to a lung field, pt was educated on  S/S of pneumothorax and to seek immediate medical attention should they occur.  Patient was educated on signs and symptoms of infection and other risk factors and advised to seek medical attention should they occur.  Patient verbalized understanding of these instructions and education.   Patient Verbal Consent Given: Yes Education Handout Provided: Yes Muscles Treated: bilateral Upper traps Treatment Response/Outcome: LTR's DN performed and documented by Cherylene Corrente, PT Hs curls 25lb 2x12 Leg Ext 10lb 2x10 Horiz abd red 2x10 Shoulder ER red 2x10  MHP to cervical spine & UT   05/26/23 NuStep L5 x 6 min S2S OHP w/ yellow ball 2x10 Horix abd red 2x10 Hs curls 25lb 2x12 Leg Ext 10lb 2x10 Shoulder Ext 10lb 2x10  05/19/23 NuStep L5 x 6 min Hs curls 25lb 2x10 Leg Ext 5lb 2x10 Shoulder Rows & Ext green 2x10 Seated ab sets w/ Pball 2x10 Shoulder ER yellow 2x10 Seated OHP yellow ball 2x10  05/12/23 EVAL                                                                                                                                  PATIENT EDUCATION:  Education details: POC and HEP Person educated: Patient Education method: Explanation Education comprehension: verbalized understanding and returned demonstration  HOME EXERCISE PROGRAM: Access Code: [email or web address] URL: [email or web address] Date: 05/12/2023 Prepared by: Donavon Fudge Exercises - Seated March  - 1 x daily - 7 x weekly - 2 sets - 10 reps - Seated Hip Abduction with Resistance  - 1 x daily - 7 x weekly - 2 sets - 10 reps - Seated Cervical Sidebending Stretch  - 1 x daily - 7 x weekly - 2 sets - 10 reps - 10 hold - Seated Cervical Flexion Stretch with Finger Support Behind Neck  - 1 x daily - 7 x weekly - 2 sets - 10 reps - 10 hold   ASSESSMENT:  CLINICAL IMPRESSION: Patient is a 73 y.o. female who was seen today for physical therapy treatment for scoliosis.We did not  see her over the past 3-4 weeks and she now returns with  a lot of spasms in the upper traps, reports that since she started PT she has had lees HA.  Needs some cues for posture and body mechanics. She has a little better ROM but is very stiff and the rotation is painful Pt will benefit from skilled PT to work on posture, decrease pain, and address weakness in her hip flexors.   OBJECTIVE IMPAIRMENTS: Abnormal gait, difficulty walking, decreased ROM, decreased strength, and pain.   ACTIVITY LIMITATIONS: carrying, lifting, bending, sitting, standing, and stairs  PARTICIPATION LIMITATIONS: cleaning, laundry, shopping, community activity, and yard work  PERSONAL FACTORS: Age, Behavior pattern, and Past/current experiences are also affecting patient's functional outcome.   REHAB POTENTIAL: Good  CLINICAL DECISION MAKING: Stable/uncomplicated  EVALUATION COMPLEXITY: Low  GOALS: Goals reviewed with patient? No  SHORT TERM GOALS: Target date: 05/26/23  Patient  will be independent with initial HEP.  Baseline: given 05/12/23 Goal status: Met 06/02/23  LONG TERM GOALS: Target date: 06/09/23  Patient will be independent with advanced/ongoing HEP to improve outcomes and carryover.  Baseline:  Goal status: progressing 08/11/23  2.  Patient will report 50-75% improvement in neck/shoulder pain to improve QOL.  Baseline: 7/10 (05/12/23) Goal status: progressing 08/11/23  3.  Patient will demonstrate full pain free lumbar ROM to perform ADLs.   Baseline: see chart above Goal status: progressing 08/11/23  4.  Patient will be able to carry a case of water  and groceries into the house without pain  Baseline: unable to (05/12/23) Goal status: ongoing 08/11/23  5.  Patient will be able to mop house without reporting pain. Baseline: reports pain with mopping Goal status: progressing does some with a swifter but not mop 08/11/23  6.  Patient to demonstrate ability to achieve and maintain good spinal  alignment/posturing and body mechanics needed for daily activities. Baseline: rounded shoulders, forward head, increased thoracic kyphosis, scoliosis Goal status: progressing 08/11/23  PLAN:  PT FREQUENCY: 1x/week  PT DURATION: 4 weeks  PLANNED INTERVENTIONS: 97110-Therapeutic exercises, 97530- Therapeutic activity, 97112- Neuromuscular re-education, 97535- Self Care, 91478- Manual therapy, Dry Needling, Cryotherapy, and Moist heat.  PLAN FOR NEXT SESSION: neck stretching to address, dry needling to neck, address posture will resume PT   Janet Decesare W, PT 08/11/2023, 10:23 AM

## 2023-08-17 ENCOUNTER — Ambulatory Visit: Admitting: Physical Therapy

## 2023-08-17 ENCOUNTER — Encounter: Payer: Self-pay | Admitting: Physical Therapy

## 2023-08-17 DIAGNOSIS — M6281 Muscle weakness (generalized): Secondary | ICD-10-CM

## 2023-08-17 DIAGNOSIS — R293 Abnormal posture: Secondary | ICD-10-CM

## 2023-08-17 DIAGNOSIS — M546 Pain in thoracic spine: Secondary | ICD-10-CM

## 2023-08-17 DIAGNOSIS — M5459 Other low back pain: Secondary | ICD-10-CM

## 2023-08-17 DIAGNOSIS — M542 Cervicalgia: Secondary | ICD-10-CM

## 2023-08-17 NOTE — Therapy (Signed)
 OUTPATIENT PHYSICAL THERAPY THORACOLUMBAR TREATMENT/PROGRESS NOTE    Patient Name: Vanessa Blair MRN: [id number] DOB:[birth year], 73 y.o., female Today's Date: 08/17/2023  Progress Note Reporting Period 05/12/23 to 08/17/23 See note below for Objective Data and Assessment of Progress/Goals.      END OF SESSION:  PT End of Session - 08/17/23 0900     Visit Number 10    Date for PT Re-Evaluation 09/11/23    Authorization Type [employer] medicare    Progress Note Due on Visit 20    PT Start Time 0848    PT Stop Time 0927    PT Time Calculation (min) 39 min    Activity Tolerance Patient tolerated treatment well    Behavior During Therapy WFL for tasks assessed/performed              Past Medical History:  Diagnosis Date   Acid reflux    Anemia    Anxiety    Arthritis    Hiatal hernia    Hypercholesteremia    Hypertension    Past Surgical History:  Procedure Laterality Date   BREAST SURGERY     NASAL SINUS SURGERY     TOTAL HIP ARTHROPLASTY     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   Procedure: TOTAL KNEE ARTHROPLASTY;  Surgeon: Liliane Rei, MD;  Location: [facility];  Service: Orthopedics;  Laterality: Left;   TUBAL LIGATION     Patient Active Problem List   Diagnosis Date Noted   OA (osteoarthritis) of knee 07/18/2019   Primary osteoarthritis of left knee 07/18/2019    PCP: Dr. Dewitt Forehand  REFERRING PROVIDER:  Dr. Mort Ards  REFERRING DIAG: Scoliosis  Rationale for Evaluation and Treatment: Rehabilitation  THERAPY DIAG:  Abnormal posture  Muscle weakness (generalized)  Pain in thoracic spine  Neck pain  Other low back pain  ONSET DATE: not specified  SUBJECTIVE:                                                                                                                                                                                           SUBJECTIVE STATEMENT:  I'm very tight and stiff today, didn't do HEP yesterday due to mother's  day.   PERTINENT HISTORY:  L TKA (07/27/2019) THA OA Osteoporosis Hypertension  PAIN:  Are you having pain? Yes: NPRS scale: 7/10 Pain location: mainly in the shoulders/neck and low back Pain description: dull Aggravating factors: lifting heavy objects (groceries) Relieving factors: biofreeze  PRECAUTIONS: None  RED FLAGS: None   WEIGHT BEARING RESTRICTIONS: No  FALLS:  Has patient fallen in last 6 months? No  LIVING ENVIRONMENT: Lives with:  lives alone Lives in: House/apartment Stairs: No Has following equipment at home: None  OCCUPATION: retired  PLOF: Independent  PATIENT GOALS: cleaning with no pain (mopping), lifting 24 pack of water   NEXT MD VISIT: none scheduled  OBJECTIVE:  Note: Objective measures were completed at Evaluation unless otherwise noted.  DIAGNOSTIC FINDINGS:  none  COGNITION: Overall cognitive status: Within functional limits for tasks assessed     SENSATION: WFL  POSTURE: rounded shoulders, forward head, decreased lumbar lordosis, and increased thoracic kyphosis  PALPATION: CPAs: C7-T3 were hypomobile, painful; only C7 got better with repeated motions  LUMBAR ROM:   AROM eval 08/11/23  Flexion 100%* WNL  Extension 50%* Decreased 25%  Right lateral flexion 50%* 50%  Left lateral flexion 50%* 50%  Right rotation 50%* Still pain  Left rotation 50%* Still pain   (Blank rows = not tested, * = pain)  LOWER EXTREMITY ROM:   grossly WFL BLE  Cervical ROM: Grossly WFL, pain with right side bend (pain felt on left side), left rotation, flexion and extension   LOWER EXTREMITY MMT:    MMT Right eval Left eval Right 08/17/23 Left 08/17/23  Hip flexion 4-/5 4-/5 4 4   Hip extension      Hip abduction 4 4 4+ 4+  Hip adduction 4- 4-    Hip internal rotation      Hip external rotation      Knee flexion   4+ 4+  Knee extension   4+ 4+  Ankle dorsiflexion      Ankle plantarflexion      Ankle inversion      Ankle eversion        (Blank rows = not tested)  FUNCTIONAL TESTS:  5 times sit to stand: 20.67 Timed up and go (TUG): 12.04  GAIT: Distance walked: in clinic distances Assistive device utilized: None Level of assistance: Complete Independence Comments: knee valgus   TREATMENT DATE:  08/17/23  UBE L4x4 min forward/4 min backward  Updated LE MMT for progress note- cervical ROM and goals had already been updated last visit   Thoracic flexion + rotation stretch x10 B Scapular retractions x15  Corner stretch 3x30 seconds for pecs  Rows 15# 2x10 Lats 15# 2x10   MFR followed by STM L upper trap      08/11/23 UBE level 3 x 5 minutes Nustep level 5 x 6 minutes Shrugs Cervical and scapular retractions STM to the upper traps, cervical area into the rhomboids Gentle passive cervical stretching  07/16/23 Nustep level 5 x 6 minutes UBE level 4 x 5 minutes 15# rows 2x10 15# lats 2x10 Red tband ER Red tband horizontal abduction 3# shrugs with upper trap and levator stretches STM to the upper traps, cervical area and into the rhomboids  07/08/23 UBE level 3 x 6 minutes Back to wall red tband ER Red tband horizontal abduction 15# rows 15# lats Ball in lap isometrics Nustep level 4 x 5 minutes 3# shrugs with upper trap and levator stretches STM to the upper trap, neck and rhomboids  06/29/23 Nustep level 5 x 6 minutes 15# row 2x10 15# lats 2x10 Red tband ER Red tband horizontal abduction 4# shrugs with upper trap and levator stretches Postural holds with back to wall STM to the neck, upper traps and the rhomboids  06/24/23 Nustep level 5 x 6 minutes Back to wall red tband rows  Red tband extension Yellow tband horizontal abduction Back to wall postural holds Trigger Point Dry Needling  Subsequent Treatment:  Instructions provided previously at initial dry needling treatment.   Patient Verbal Consent Given: Yes Education Handout Provided: Previously Provided Muscles Treated: left  rhomboids, left upper trap Treatment Response/Outcome: LTR and "feels looser" STM to the left upper trap, neck and rhomboids   06/02/23 NuStep L5 x 6 min Trigger Point Dry Needling  Initial Treatment: Pt instructed on Dry Needling rational, procedures, and possible side effects. Pt instructed to expect mild to moderate muscle soreness later in the day and/or into the next day.  Pt instructed in methods to reduce muscle soreness. Pt instructed to continue prescribed HEP. Because Dry Needling was performed over or adjacent to a lung field, pt was educated on S/S of pneumothorax and to seek immediate medical attention should they occur.  Patient was educated on signs and symptoms of infection and other risk factors and advised to seek medical attention should they occur.  Patient verbalized understanding of these instructions and education.   Patient Verbal Consent Given: Yes Education Handout Provided: Yes Muscles Treated: bilateral Upper traps Treatment Response/Outcome: LTR's DN performed and documented by Cherylene Corrente, PT Hs curls 25lb 2x12 Leg Ext 10lb 2x10 Horiz abd red 2x10 Shoulder ER red 2x10  MHP to cervical spine & UT   05/26/23 NuStep L5 x 6 min S2S OHP w/ yellow ball 2x10 Horix abd red 2x10 Hs curls 25lb 2x12 Leg Ext 10lb 2x10 Shoulder Ext 10lb 2x10  05/19/23 NuStep L5 x 6 min Hs curls 25lb 2x10 Leg Ext 5lb 2x10 Shoulder Rows & Ext green 2x10 Seated ab sets w/ Pball 2x10 Shoulder ER yellow 2x10 Seated OHP yellow ball 2x10  05/12/23 EVAL                                                                                                                                  PATIENT EDUCATION:  Education details: POC and HEP Person educated: Patient Education method: Explanation Education comprehension: verbalized understanding and returned demonstration  HOME EXERCISE PROGRAM: Access Code: [email or web address] URL: [email or web address] Date: 05/12/2023  Prepared by: Donavon Fudge Exercises - Seated March  - 1 x daily - 7 x weekly - 2 sets - 10 reps - Seated Hip Abduction with Resistance  - 1 x daily - 7 x weekly - 2 sets - 10 reps - Seated Cervical Sidebending Stretch  - 1 x daily - 7 x weekly - 2 sets - 10 reps - 10 hold - Seated Cervical Flexion Stretch with Finger Support Behind Neck  - 1 x daily - 7 x weekly - 2 sets - 10 reps - 10 hold   ASSESSMENT:  CLINICAL IMPRESSION:  Completed progress note as required for insurance today. She reports high pain levels not consistent with exercise tolerance and performance during session today. Updated objective data, otherwise continued with appropriate exercise based interventions today, encouraged self-management strategies for pain and mm spasms at home. She deferred low back interventions today, reports it only feels more  stiff due to getting up so early. Really would benefit from consistent independent exercise program to hep manage her pain long term.   OBJECTIVE IMPAIRMENTS: Abnormal gait, difficulty walking, decreased ROM, decreased strength, and pain.   ACTIVITY LIMITATIONS: carrying, lifting, bending, sitting, standing, and stairs  PARTICIPATION LIMITATIONS: cleaning, laundry, shopping, community activity, and yard work  PERSONAL FACTORS: Age, Behavior pattern, and Past/current experiences are also affecting patient's functional outcome.   REHAB POTENTIAL: Good  CLINICAL DECISION MAKING: Stable/uncomplicated  EVALUATION COMPLEXITY: Low  GOALS: Goals reviewed with patient? No  SHORT TERM GOALS: Target date: 05/26/23  Patient will be independent with initial HEP.  Baseline: given 05/12/23 Goal status: Met 06/02/23  LONG TERM GOALS: Target date: 06/09/23  Patient will be independent with advanced/ongoing HEP to improve outcomes and carryover.  Baseline:  Goal status: progressing 08/11/23  2.  Patient will report 50-75% improvement in neck/shoulder pain to improve QOL.  Baseline: 7/10  (05/12/23) Goal status: progressing 08/11/23  3.  Patient will demonstrate full pain free lumbar ROM to perform ADLs.   Baseline: see chart above Goal status: progressing 08/11/23  4.  Patient will be able to carry a case of water  and groceries into the house without pain  Baseline: unable to (05/12/23) Goal status: ongoing 08/11/23  5.  Patient will be able to mop house without reporting pain. Baseline: reports pain with mopping Goal status: progressing does some with a swifter but not mop 08/11/23  6.  Patient to demonstrate ability to achieve and maintain good spinal alignment/posturing and body mechanics needed for daily activities. Baseline: rounded shoulders, forward head, increased thoracic kyphosis, scoliosis Goal status: progressing 08/11/23  PLAN:  PT FREQUENCY: 1x/week  PT DURATION: 4 weeks  PLANNED INTERVENTIONS: 97110-Therapeutic exercises, 97530- Therapeutic activity, 97112- Neuromuscular re-education, 97535- Self Care, 16109- Manual therapy, Dry Needling, Cryotherapy, and Moist heat.  PLAN FOR NEXT SESSION: neck stretching to address, dry needling to neck, address posture will resume PT, start winding down to DC with appropriate HEP updates/activity reccs   Terrel Ferries, PT, DPT 08/17/23 9:29 AM

## 2023-08-25 ENCOUNTER — Ambulatory Visit: Admitting: Physical Therapy

## 2023-08-25 ENCOUNTER — Encounter: Payer: Self-pay | Admitting: Physical Therapy

## 2023-08-25 DIAGNOSIS — R293 Abnormal posture: Secondary | ICD-10-CM | POA: Diagnosis not present

## 2023-08-25 DIAGNOSIS — M6281 Muscle weakness (generalized): Secondary | ICD-10-CM

## 2023-08-25 NOTE — Therapy (Signed)
 OUTPATIENT PHYSICAL THERAPY THORACOLUMBAR TREATMENT   Patient Name: Vanessa Blair MRN: [id number] DOB:1950-05-05, 73 y.o., female Today's Date: 08/25/2023      END OF SESSION:  PT End of Session - 08/25/23 1313     Visit Number 11    Date for PT Re-Evaluation 09/11/23    Authorization Type [employer] medicare    Progress Note Due on Visit 20    PT Start Time 1302    PT Stop Time 1336   MHP not included in billing   PT Time Calculation (min) 34 min    Activity Tolerance Patient tolerated treatment well    Behavior During Therapy WFL for tasks assessed/performed               Past Medical History:  Diagnosis Date   Acid reflux    Anemia    Anxiety    Arthritis    Hiatal hernia    Hypercholesteremia    Hypertension    Past Surgical History:  Procedure Laterality Date   BREAST SURGERY     NASAL SINUS SURGERY     TOTAL HIP ARTHROPLASTY     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   Procedure: TOTAL KNEE ARTHROPLASTY;  Surgeon: Liliane Rei, MD;  Location: [facility];  Service: Orthopedics;  Laterality: Left;   TUBAL LIGATION     Patient Active Problem List   Diagnosis Date Noted   OA (osteoarthritis) of knee 07/18/2019   Primary osteoarthritis of left knee 07/18/2019    PCP: Dr. Dewitt Forehand  REFERRING PROVIDER:  Dr. Mort Ards  REFERRING DIAG: Scoliosis  Rationale for Evaluation and Treatment: Rehabilitation  THERAPY DIAG:  Abnormal posture  Muscle weakness (generalized)  ONSET DATE: not specified  SUBJECTIVE:                                                                                                                                                                                           SUBJECTIVE STATEMENT:  I do the bands every day at home, nothing is really new. I've been having a headache, Dr. Vaughn Georges said that the headaches are coming from my shoulders because of scoliosis   PERTINENT HISTORY:  L TKA  (07/27/2019) THA OA Osteoporosis Hypertension  PAIN:  Are you having pain? Yes: NPRS scale: 7/10 Pain location: mainly in the shoulders/neck and HA  Pain description: discomfort  Aggravating factors: lifting heavy objects (groceries) Relieving factors: biofreeze, heat   PRECAUTIONS: None  RED FLAGS: None   WEIGHT BEARING RESTRICTIONS: No  FALLS:  Has patient fallen in last 6 months? No  LIVING ENVIRONMENT: Lives with: lives alone Lives in: House/apartment Stairs:  No Has following equipment at home: None  OCCUPATION: retired  PLOF: Independent  PATIENT GOALS: cleaning with no pain (mopping), lifting 24 pack of water   NEXT MD VISIT: none scheduled  OBJECTIVE:  Note: Objective measures were completed at Evaluation unless otherwise noted.  DIAGNOSTIC FINDINGS:  none  COGNITION: Overall cognitive status: Within functional limits for tasks assessed     SENSATION: WFL  POSTURE: rounded shoulders, forward head, decreased lumbar lordosis, and increased thoracic kyphosis  PALPATION: CPAs: C7-T3 were hypomobile, painful; only C7 got better with repeated motions  LUMBAR ROM:   AROM eval 08/11/23  Flexion 100%* WNL  Extension 50%* Decreased 25%  Right lateral flexion 50%* 50%  Left lateral flexion 50%* 50%  Right rotation 50%* Still pain  Left rotation 50%* Still pain   (Blank rows = not tested, * = pain)  LOWER EXTREMITY ROM:   grossly WFL BLE  Cervical ROM: Grossly WFL, pain with right side bend (pain felt on left side), left rotation, flexion and extension   LOWER EXTREMITY MMT:    MMT Right eval Left eval Right 08/17/23 Left 08/17/23  Hip flexion 4-/5 4-/5 4 4   Hip extension      Hip abduction 4 4 4+ 4+  Hip adduction 4- 4-    Hip internal rotation      Hip external rotation      Knee flexion   4+ 4+  Knee extension   4+ 4+  Ankle dorsiflexion      Ankle plantarflexion      Ankle inversion      Ankle eversion       (Blank rows = not  tested)  FUNCTIONAL TESTS:  5 times sit to stand: 20.67 Timed up and go (TUG): 12.04  GAIT: Distance walked: in clinic distances Assistive device utilized: None Level of assistance: Complete Independence Comments: knee valgus   TREATMENT DATE:   08/25/23  UBE L4 x4 min forward/4 min backwards  Upper trap stretches 2x30 seconds B Levator stretches 2x30 seconds B  Rows 15# x15 Lat pulls 15# x15    Refused MFR today  STM to tolerance B upper traps and levators   MHP to upper traps and lats x5 min at EOS (not included in billing)   08/17/23  UBE L4x4 min forward/4 min backward  Updated LE MMT for progress note- cervical ROM and goals had already been updated last visit   Thoracic flexion + rotation stretch x10 B Scapular retractions x15  Corner stretch 3x30 seconds for pecs  Rows 15# 2x10 Lats 15# 2x10   MFR followed by STM L upper trap      08/11/23 UBE level 3 x 5 minutes Nustep level 5 x 6 minutes Shrugs Cervical and scapular retractions STM to the upper traps, cervical area into the rhomboids Gentle passive cervical stretching  07/16/23 Nustep level 5 x 6 minutes UBE level 4 x 5 minutes 15# rows 2x10 15# lats 2x10 Red tband ER Red tband horizontal abduction 3# shrugs with upper trap and levator stretches STM to the upper traps, cervical area and into the rhomboids  07/08/23 UBE level 3 x 6 minutes Back to wall red tband ER Red tband horizontal abduction 15# rows 15# lats Ball in lap isometrics Nustep level 4 x 5 minutes 3# shrugs with upper trap and levator stretches STM to the upper trap, neck and rhomboids  06/29/23 Nustep level 5 x 6 minutes 15# row 2x10 15# lats 2x10 Red tband ER Red tband horizontal abduction  4# shrugs with upper trap and levator stretches Postural holds with back to wall STM to the neck, upper traps and the rhomboids                                                                                                                          PATIENT EDUCATION:  Education details: POC and HEP Person educated: Patient Education method: Explanation Education comprehension: verbalized understanding and returned demonstration  HOME EXERCISE PROGRAM: Access Code: [email or web address] URL: [email or web address] Date: 05/12/2023 Prepared by: Donavon Fudge Exercises - Seated March  - 1 x daily - 7 x weekly - 2 sets - 10 reps - Seated Hip Abduction with Resistance  - 1 x daily - 7 x weekly - 2 sets - 10 reps - Seated Cervical Sidebending Stretch  - 1 x daily - 7 x weekly - 2 sets - 10 reps - 10 hold - Seated Cervical Flexion Stretch with Finger Support Behind Neck  - 1 x daily - 7 x weekly - 2 sets - 10 reps - 10 hold   ASSESSMENT:  CLINICAL IMPRESSION:  Pt arrived today doing OK, she continues to report similar symptoms. Warmed up on UBE and kept working on postural training, also incorporated ongoing manual interventions to upper traps and c-spine. Finished with MHP. We will keep working towards DC with advanced HEP. Unfortunately this problem has been fairly chronic for her, she has had PT multiple times with limited long term effect, may benefit from next step in medical interventions to address her recurrent/chronic pain after finishing this course of PT.   OBJECTIVE IMPAIRMENTS: Abnormal gait, difficulty walking, decreased ROM, decreased strength, and pain.   ACTIVITY LIMITATIONS: carrying, lifting, bending, sitting, standing, and stairs  PARTICIPATION LIMITATIONS: cleaning, laundry, shopping, community activity, and yard work  PERSONAL FACTORS: Age, Behavior pattern, and Past/current experiences are also affecting patient's functional outcome.   REHAB POTENTIAL: Good  CLINICAL DECISION MAKING: Stable/uncomplicated  EVALUATION COMPLEXITY: Low  GOALS: Goals reviewed with patient? No  SHORT TERM GOALS: Target date: 05/26/23  Patient will be independent with initial HEP.  Baseline: given 05/12/23 Goal  status: Met 06/02/23  LONG TERM GOALS: Target date: 06/09/23  Patient will be independent with advanced/ongoing HEP to improve outcomes and carryover.  Baseline:  Goal status: progressing 08/11/23  2.  Patient will report 50-75% improvement in neck/shoulder pain to improve QOL.  Baseline: 7/10 (05/12/23) Goal status: progressing 08/11/23  3.  Patient will demonstrate full pain free lumbar ROM to perform ADLs.   Baseline: see chart above Goal status: progressing 08/11/23  4.  Patient will be able to carry a case of water  and groceries into the house without pain  Baseline: unable to (05/12/23) Goal status: ongoing 08/11/23  5.  Patient will be able to mop house without reporting pain. Baseline: reports pain with mopping Goal status: progressing does some with a swifter but not mop 08/11/23  6.  Patient to demonstrate ability to achieve and maintain good  spinal alignment/posturing and body mechanics needed for daily activities. Baseline: rounded shoulders, forward head, increased thoracic kyphosis, scoliosis Goal status: progressing 08/11/23  PLAN:  PT FREQUENCY: 1x/week  PT DURATION: 4 weeks  PLANNED INTERVENTIONS: 97110-Therapeutic exercises, 97530- Therapeutic activity, 97112- Neuromuscular re-education, 97535- Self Care, [id number]- Manual therapy, Dry Needling, Cryotherapy, and Moist heat.  PLAN FOR NEXT SESSION: neck stretching to address, dry needling to neck, address posture will resume PT, wind down to DC/advanced HEP   Terrel Ferries, PT, DPT 08/25/23 1:37 PM

## 2023-09-02 NOTE — Therapy (Signed)
 OUTPATIENT PHYSICAL THERAPY THORACOLUMBAR TREATMENT   Patient Name: Vanessa Blair MRN: [id number] DOB:10-04-1950, 73 y.o., female Today's Date: 09/03/2023      END OF SESSION:  PT End of Session - 09/03/23 1232     Visit Number 12    Date for PT Re-Evaluation 09/11/23    Authorization Type [employer] medicare    Progress Note Due on Visit 20    PT Start Time 1232    PT Stop Time 1315    PT Time Calculation (min) 43 min    Activity Tolerance Patient tolerated treatment well    Behavior During Therapy WFL for tasks assessed/performed                Past Medical History:  Diagnosis Date   Acid reflux    Anemia    Anxiety    Arthritis    Hiatal hernia    Hypercholesteremia    Hypertension    Past Surgical History:  Procedure Laterality Date   BREAST SURGERY     NASAL SINUS SURGERY     TOTAL HIP ARTHROPLASTY     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   Procedure: TOTAL KNEE ARTHROPLASTY;  Surgeon: Liliane Rei, MD;  Location: [facility];  Service: Orthopedics;  Laterality: Left;   TUBAL LIGATION     Patient Active Problem List   Diagnosis Date Noted   OA (osteoarthritis) of knee 07/18/2019   Primary osteoarthritis of left knee 07/18/2019    PCP: Dr. Dewitt Forehand  REFERRING PROVIDER:  Dr. Mort Ards  REFERRING DIAG: Scoliosis  Rationale for Evaluation and Treatment: Rehabilitation  THERAPY DIAG:  Abnormal posture  Muscle weakness (generalized)  Pain in thoracic spine  Neck pain  Other low back pain  Other idiopathic scoliosis, cervical region  ONSET DATE: not specified  SUBJECTIVE:                                                                                                                                                                                           SUBJECTIVE STATEMENT:  Last night I went to turn my head and I got a cramp.   PERTINENT HISTORY:  L TKA (07/27/2019) THA OA Osteoporosis Hypertension  PAIN:  Are you having  pain? Yes: NPRS scale: 7/10 Pain location: mainly in the shoulders/neck and HA  Pain description: discomfort  Aggravating factors: lifting heavy objects (groceries) Relieving factors: biofreeze, heat   PRECAUTIONS: None  RED FLAGS: None   WEIGHT BEARING RESTRICTIONS: No  FALLS:  Has patient fallen in last 6 months? No  LIVING ENVIRONMENT: Lives with: lives alone Lives in: House/apartment Stairs: No Has following equipment  at home: None  OCCUPATION: retired  PLOF: Independent  PATIENT GOALS: cleaning with no pain (mopping), lifting 24 pack of water   NEXT MD VISIT: none scheduled  OBJECTIVE:  Note: Objective measures were completed at Evaluation unless otherwise noted.  DIAGNOSTIC FINDINGS:  none  COGNITION: Overall cognitive status: Within functional limits for tasks assessed     SENSATION: WFL  POSTURE: rounded shoulders, forward head, decreased lumbar lordosis, and increased thoracic kyphosis  PALPATION: CPAs: C7-T3 were hypomobile, painful; only C7 got better with repeated motions  LUMBAR ROM:   AROM eval 08/11/23  Flexion 100%* WNL  Extension 50%* Decreased 25%  Right lateral flexion 50%* 50%  Left lateral flexion 50%* 50%  Right rotation 50%* Still pain  Left rotation 50%* Still pain   (Blank rows = not tested, * = pain)  LOWER EXTREMITY ROM:   grossly WFL BLE  Cervical ROM: Grossly WFL, pain with right side bend (pain felt on left side), left rotation, flexion and extension   LOWER EXTREMITY MMT:    MMT Right eval Left eval Right 08/17/23 Left 08/17/23  Hip flexion 4-/5 4-/5 4 4   Hip extension      Hip abduction 4 4 4+ 4+  Hip adduction 4- 4-    Hip internal rotation      Hip external rotation      Knee flexion   4+ 4+  Knee extension   4+ 4+  Ankle dorsiflexion      Ankle plantarflexion      Ankle inversion      Ankle eversion       (Blank rows = not tested)  FUNCTIONAL TESTS:  5 times sit to stand: 20.67 Timed up and go (TUG):  12.04  GAIT: Distance walked: in clinic distances Assistive device utilized: None Level of assistance: Complete Independence Comments: knee valgus   TREATMENT DATE: 09/03/23 Recheck goals NuStep L5 x50mins  UBE L2 x2 mins each way  Shoulder ext 10# 2x10 Seated row 20# 2x10 Lat pull down 20# 2x10 DN to R upper trap   08/25/23  UBE L4 x4 min forward/4 min backwards  Upper trap stretches 2x30 seconds B Levator stretches 2x30 seconds B  Rows 15# x15 Lat pulls 15# x15   Refused MFR today  STM to tolerance B upper traps and levators   MHP to upper traps and lats x5 min at EOS (not included in billing)   08/17/23  UBE L4x4 min forward/4 min backward  Updated LE MMT for progress note- cervical ROM and goals had already been updated last visit   Thoracic flexion + rotation stretch x10 B Scapular retractions x15  Corner stretch 3x30 seconds for pecs  Rows 15# 2x10 Lats 15# 2x10   MFR followed by STM L upper trap    08/11/23 UBE level 3 x 5 minutes Nustep level 5 x 6 minutes Shrugs Cervical and scapular retractions STM to the upper traps, cervical area into the rhomboids Gentle passive cervical stretching  07/16/23 Nustep level 5 x 6 minutes UBE level 4 x 5 minutes 15# rows 2x10 15# lats 2x10 Red tband ER Red tband horizontal abduction 3# shrugs with upper trap and levator stretches STM to the upper traps, cervical area and into the rhomboids  07/08/23 UBE level 3 x 6 minutes Back to wall red tband ER Red tband horizontal abduction 15# rows 15# lats Ball in lap isometrics Nustep level 4 x 5 minutes 3# shrugs with upper trap and levator stretches STM to the upper  trap, neck and rhomboids  06/29/23 Nustep level 5 x 6 minutes 15# row 2x10 15# lats 2x10 Red tband ER Red tband horizontal abduction 4# shrugs with upper trap and levator stretches Postural holds with back to wall STM to the neck, upper traps and the rhomboids                                                                                                                          PATIENT EDUCATION:  Education details: POC and HEP Person educated: Patient Education method: Explanation Education comprehension: verbalized understanding and returned demonstration  HOME EXERCISE PROGRAM: Access Code: [email or web address] URL: [email or web address] Date: 05/12/2023 Prepared by: Donavon Fudge Exercises - Seated March  - 1 x daily - 7 x weekly - 2 sets - 10 reps - Seated Hip Abduction with Resistance  - 1 x daily - 7 x weekly - 2 sets - 10 reps - Seated Cervical Sidebending Stretch  - 1 x daily - 7 x weekly - 2 sets - 10 reps - 10 hold - Seated Cervical Flexion Stretch with Finger Support Behind Neck  - 1 x daily - 7 x weekly - 2 sets - 10 reps - 10 hold   ASSESSMENT:  CLINICAL IMPRESSION Pt arrived today doing OK, she continues to report similar symptoms with little to no progression. We focused on postural training with machine level interventions. Finished with dry needling to the traps. We will keep working towards DC with advanced HEP. Unfortunately this problem has been fairly chronic for her, she has had PT multiple times with limited long term effect. She has one appointment left, will try to d/c her.   OBJECTIVE IMPAIRMENTS: Abnormal gait, difficulty walking, decreased ROM, decreased strength, and pain.   ACTIVITY LIMITATIONS: carrying, lifting, bending, sitting, standing, and stairs  PARTICIPATION LIMITATIONS: cleaning, laundry, shopping, community activity, and yard work  PERSONAL FACTORS: Age, Behavior pattern, and Past/current experiences are also affecting patient's functional outcome.   REHAB POTENTIAL: Good  CLINICAL DECISION MAKING: Stable/uncomplicated  EVALUATION COMPLEXITY: Low  GOALS: Goals reviewed with patient? No  SHORT TERM GOALS: Target date: 05/26/23  Patient will be independent with initial HEP.  Baseline: given 05/12/23 Goal status: Met 06/02/23  LONG  TERM GOALS: Target date:: 09/11/23  Patient will be independent with advanced/ongoing HEP to improve outcomes and carryover.  Baseline:  Goal status: progressing 08/11/23  2.  Patient will report 50-75% improvement in neck/shoulder pain to improve QOL.  Baseline: 7/10 (05/12/23) Goal status: progressing 08/11/23, 7/10 today 09/03/23  3.  Patient will demonstrate full pain free lumbar ROM to perform ADLs.   Baseline: see chart above Goal status: progressing 08/11/23  4.  Patient will be able to carry a case of water  and groceries into the house without pain  Baseline: unable to (05/12/23) Goal status: ongoing 08/11/23, still hard to do on some days 09/03/23  5.  Patient will be able to mop house without reporting pain. Baseline: reports pain with mopping  Goal status: progressing does some with a swifter but not mop 08/11/23, cannot do this 09/03/23  6.  Patient to demonstrate ability to achieve and maintain good spinal alignment/posturing and body mechanics needed for daily activities. Baseline: rounded shoulders, forward head, increased thoracic kyphosis, scoliosis Goal status: progressing 08/11/23, progressing 09/03/23  PLAN:  PT FREQUENCY: 1x/week  PT DURATION: 4 weeks  PLANNED INTERVENTIONS: 97110-Therapeutic exercises, 97530- Therapeutic activity, 97112- Neuromuscular re-education, 97535- Self Care, 78469- Manual therapy, Dry Needling, Cryotherapy, and Moist heat.  PLAN FOR NEXT SESSION: DC/advanced HEP   Donavon Fudge, PT, DPT 09/03/23 1:34 PM

## 2023-09-03 ENCOUNTER — Ambulatory Visit

## 2023-09-03 DIAGNOSIS — M4122 Other idiopathic scoliosis, cervical region: Secondary | ICD-10-CM

## 2023-09-03 DIAGNOSIS — M546 Pain in thoracic spine: Secondary | ICD-10-CM

## 2023-09-03 DIAGNOSIS — M5459 Other low back pain: Secondary | ICD-10-CM

## 2023-09-03 DIAGNOSIS — M6281 Muscle weakness (generalized): Secondary | ICD-10-CM

## 2023-09-03 DIAGNOSIS — M542 Cervicalgia: Secondary | ICD-10-CM

## 2023-09-03 DIAGNOSIS — R293 Abnormal posture: Secondary | ICD-10-CM | POA: Diagnosis not present

## 2023-09-08 ENCOUNTER — Encounter: Payer: Self-pay | Admitting: Physical Therapy

## 2023-09-08 ENCOUNTER — Ambulatory Visit: Attending: Orthopedic Surgery | Admitting: Physical Therapy

## 2023-09-08 DIAGNOSIS — M6281 Muscle weakness (generalized): Secondary | ICD-10-CM | POA: Diagnosis present

## 2023-09-08 DIAGNOSIS — M542 Cervicalgia: Secondary | ICD-10-CM | POA: Diagnosis present

## 2023-09-08 DIAGNOSIS — R293 Abnormal posture: Secondary | ICD-10-CM | POA: Insufficient documentation

## 2023-09-08 DIAGNOSIS — M546 Pain in thoracic spine: Secondary | ICD-10-CM | POA: Diagnosis present

## 2023-09-08 NOTE — Therapy (Signed)
 OUTPATIENT PHYSICAL THERAPY THORACOLUMBAR TREATMENT   Patient Name: Vanessa Blair MRN: [id number] DOB:02/14/1951, 73 y.o., female Today's Date: 09/08/2023      END OF SESSION:  PT End of Session - 09/08/23 1019     Visit Number 13    Date for PT Re-Evaluation 09/11/23    Authorization Type [employer] medicare    PT Start Time 1015    PT Stop Time 1100    PT Time Calculation (min) 45 min    Activity Tolerance Patient tolerated treatment well    Behavior During Therapy WFL for tasks assessed/performed                Past Medical History:  Diagnosis Date   Acid reflux    Anemia    Anxiety    Arthritis    Hiatal hernia    Hypercholesteremia    Hypertension    Past Surgical History:  Procedure Laterality Date   BREAST SURGERY     NASAL SINUS SURGERY     TOTAL HIP ARTHROPLASTY     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   Procedure: TOTAL KNEE ARTHROPLASTY;  Surgeon: Liliane Rei, MD;  Location: [facility];  Service: Orthopedics;  Laterality: Left;   TUBAL LIGATION     Patient Active Problem List   Diagnosis Date Noted   OA (osteoarthritis) of knee 07/18/2019   Primary osteoarthritis of left knee 07/18/2019    PCP: Dr. Dewitt Forehand  REFERRING PROVIDER:  Dr. Mort Ards  REFERRING DIAG: Scoliosis  Rationale for Evaluation and Treatment: Rehabilitation  THERAPY DIAG:  Abnormal posture  Muscle weakness (generalized)  Pain in thoracic spine  Neck pain  ONSET DATE: not specified  SUBJECTIVE:                                                                                                                                                                                           SUBJECTIVE STATEMENT:  Patient reports that she did some house cleaning this weekend and reports that her neck is really stiff and sore  PERTINENT HISTORY:  L TKA (07/27/2019) THA OA Osteoporosis Hypertension  PAIN:  Are you having pain? Yes: NPRS scale: 6/10 Pain location:  mainly in the shoulders/neck and HA  Pain description: discomfort  Aggravating factors: lifting heavy objects (groceries) Relieving factors: biofreeze, heat   PRECAUTIONS: None  RED FLAGS: None   WEIGHT BEARING RESTRICTIONS: No  FALLS:  Has patient fallen in last 6 months? No  LIVING ENVIRONMENT: Lives with: lives alone Lives in: House/apartment Stairs: No Has following equipment at home: None  OCCUPATION: retired  PLOF: Independent  PATIENT GOALS: cleaning with  no pain (mopping), lifting 24 pack of water   NEXT MD VISIT: none scheduled  OBJECTIVE:  Note: Objective measures were completed at Evaluation unless otherwise noted.  DIAGNOSTIC FINDINGS:  none  COGNITION: Overall cognitive status: Within functional limits for tasks assessed     SENSATION: WFL  POSTURE: rounded shoulders, forward head, decreased lumbar lordosis, and increased thoracic kyphosis  PALPATION: CPAs: C7-T3 were hypomobile, painful; only C7 got better with repeated motions  LUMBAR ROM:   AROM eval 08/11/23  Flexion 100%* WNL  Extension 50%* Decreased 25%  Right lateral flexion 50%* 50%  Left lateral flexion 50%* 50%  Right rotation 50%* Still pain  Left rotation 50%* Still pain   (Blank rows = not tested, * = pain)  LOWER EXTREMITY ROM:   grossly WFL BLE  Cervical ROM: Grossly WFL, pain with right side bend (pain felt on left side), left rotation, flexion and extension   LOWER EXTREMITY MMT:    MMT Right eval Left eval Right 08/17/23 Left 08/17/23  Hip flexion 4-/5 4-/5 4 4   Hip extension      Hip abduction 4 4 4+ 4+  Hip adduction 4- 4-    Hip internal rotation      Hip external rotation      Knee flexion   4+ 4+  Knee extension   4+ 4+  Ankle dorsiflexion      Ankle plantarflexion      Ankle inversion      Ankle eversion       (Blank rows = not tested)  FUNCTIONAL TESTS:  5 times sit to stand: 20.67 Timed up and go (TUG): 12.04  GAIT: Distance walked: in clinic  distances Assistive device utilized: None Level of assistance: Complete Independence Comments: knee valgus   TREATMENT DATE: 09/08/23 UBE level 5 x 6 minutes 10# rows 15# lats Red tband ER 4# shrugs with upper trap and levator stretches STM to the upper traps and neck area Reviewed posture and body mechanics , reviewed HEP, form and sets, reps and rest  09/03/23 Recheck goals NuStep L5 x17mins  UBE L2 x2 mins each way  Shoulder ext 10# 2x10 Seated row 20# 2x10 Lat pull down 20# 2x10 DN to R upper trap   08/25/23  UBE L4 x4 min forward/4 min backwards  Upper trap stretches 2x30 seconds B Levator stretches 2x30 seconds B  Rows 15# x15 Lat pulls 15# x15   Refused MFR today  STM to tolerance B upper traps and levators   MHP to upper traps and lats x5 min at EOS (not included in billing)   08/17/23  UBE L4x4 min forward/4 min backward  Updated LE MMT for progress note- cervical ROM and goals had already been updated last visit   Thoracic flexion + rotation stretch x10 B Scapular retractions x15  Corner stretch 3x30 seconds for pecs  Rows 15# 2x10 Lats 15# 2x10   MFR followed by STM L upper trap    08/11/23 UBE level 3 x 5 minutes Nustep level 5 x 6 minutes Shrugs Cervical and scapular retractions STM to the upper traps, cervical area into the rhomboids Gentle passive cervical stretching  07/16/23 Nustep level 5 x 6 minutes UBE level 4 x 5 minutes 15# rows 2x10 15# lats 2x10 Red tband ER Red tband horizontal abduction 3# shrugs with upper trap and levator stretches STM to the upper traps, cervical area and into the rhomboids  07/08/23 UBE level 3 x 6 minutes Back to wall red tband  ER Red tband horizontal abduction 15# rows 15# lats Ball in lap isometrics Nustep level 4 x 5 minutes 3# shrugs with upper trap and levator stretches STM to the upper trap, neck and rhomboids  06/29/23 Nustep level 5 x 6 minutes 15# row 2x10 15# lats 2x10 Red tband ER Red  tband horizontal abduction 4# shrugs with upper trap and levator stretches Postural holds with back to wall STM to the neck, upper traps and the rhomboids                                                                                                                         PATIENT EDUCATION:  Education details: POC and HEP Person educated: Patient Education method: Explanation Education comprehension: verbalized understanding and returned demonstration  HOME EXERCISE PROGRAM: Access Code: [email or web address] URL: [email or web address] Date: 05/12/2023 Prepared by: Donavon Fudge Exercises - Seated March  - 1 x daily - 7 x weekly - 2 sets - 10 reps - Seated Hip Abduction with Resistance  - 1 x daily - 7 x weekly - 2 sets - 10 reps - Seated Cervical Sidebending Stretch  - 1 x daily - 7 x weekly - 2 sets - 10 reps - 10 hold - Seated Cervical Flexion Stretch with Finger Support Behind Neck  - 1 x daily - 7 x weekly - 2 sets - 10 reps - 10 hold   ASSESSMENT:  CLINICAL IMPRESSION Patient overall reports doing better, she doe shave some increased pain today due to cleaning at home, she has a lot of right upper trap spasms.  She has responded well to [facility] and the DN in the past, I feel that she can try to do on her own.  She reports understanding of the HEP and for posture  OBJECTIVE IMPAIRMENTS: Abnormal gait, difficulty walking, decreased ROM, decreased strength, and pain.   ACTIVITY LIMITATIONS: carrying, lifting, bending, sitting, standing, and stairs  PARTICIPATION LIMITATIONS: cleaning, laundry, shopping, community activity, and yard work  PERSONAL FACTORS: Age, Behavior pattern, and Past/current experiences are also affecting patient's functional outcome.   REHAB POTENTIAL: Good  CLINICAL DECISION MAKING: Stable/uncomplicated  EVALUATION COMPLEXITY: Low  GOALS: Goals reviewed with patient? No  SHORT TERM GOALS: Target date: 05/26/23  Patient will be independent with initial  HEP.  Baseline: given 05/12/23 Goal status: Met 06/02/23  LONG TERM GOALS: Target date:: 09/11/23  Patient will be independent with advanced/ongoing HEP to improve outcomes and carryover.  Baseline:  Goal status: met 09/08/23  2.  Patient will report 50-75% improvement in neck/shoulder pain to improve QOL.  Baseline: 7/10 (05/12/23) Goal status: met 09/08/23  3.  Patient will demonstrate full pain free lumbar ROM to perform ADLs.   Baseline: see chart above Goal status: met 09/08/23  4.  Patient will be able to carry a case of water  and groceries into the house without pain  Baseline: unable to (05/12/23) Goal status: met 09/08/23  5.  Patient will be able to mop house  without reporting pain. Baseline: reports pain with mopping Goal status: progressing does some with a swifter but not mop 08/11/23, cannot do this 09/03/23  6.  Patient to demonstrate ability to achieve and maintain good spinal alignment/posturing and body mechanics needed for daily activities. Baseline: rounded shoulders, forward head, increased thoracic kyphosis, scoliosis Goal status: met 09/08/23  PLAN:  PT FREQUENCY: 1x/week  PT DURATION: 4 weeks  PLANNED INTERVENTIONS: 97110-Therapeutic exercises, 97530- Therapeutic activity, 97112- Neuromuscular re-education, 97535- Self Care, 13086- Manual therapy, Dry Needling, Cryotherapy, and Moist heat.  PLAN FOR NEXT SESSION: DC/ most goals met  Cherylene Corrente, PT 09/08/23 10:19 AM

## 2024-02-03 NOTE — Progress Notes (Signed)
 [facility]  - [facility]  [address] [place] [address]                                      02/03/24 Patient name: Vanessa Blair  Date of birth: 11-21-1950   SUBJECTIVE:  Chief Complaint  Patient presents with  . Follow-up    Vanessa Blair is a 73 y.o. female comes in today for follow up of HTN, Lipids, Pre-DM, and acid reflux. Pt is fasting.  Hypertension : patient is taking blood pressure medications as prescribed. Lisinopril-hydrochlorothiazide , metoprolol . No side effects noted of medications. Patient is exercising daily and doing well. Patient is compliant with low salt diet. Patient is not checking blood pressure regularly.  Hyperlipidemia: Patient takes medication rosuvastatin as directed, no side effects.  Compliant with low fat diet : Yes  For anxiety, she takes Xanax  only as needed, reports she has not taken Xanax  for about 5 months. Last refil 3/25.   For GERD, she takes Nexium 40 mg daily.  Symptoms are controlled.  She denies any fever, chills, dizziness, headache, blurred vision. No SOB, chest pain, palpitation. No nausea, vomiting, or abdominal pain.  BP Readings from Last 3 Encounters:  02/03/24 112/62  11/05/23 107/67  10/19/23 121/79    Lab Results  Component Value Date   HGBA1C 6.2 (H) 02/03/2024   HGBA1C 6.1 (H) 06/16/2023   HGBA1C 6.2 (H) 12/16/2022    Lab Results  Component Value Date   LDLCALC 97 02/03/2024   LDLCALC 92 06/16/2023    Lab Results  Component Value Date   CHOL 177 02/03/2024   HDL 64 02/03/2024   LDLCALC 97 02/03/2024   TRIG 73 02/03/2024      The ASCVD Risk score (Arnett DK, et al., 2019) failed to calculate for the following reasons:   Risk score cannot be calculated because patient has a medical history suggesting prior/existing ASCVD  HISTORY: Vanessa Blair reviewed the allergies, current medications, past medical and surgical history, family and  social history, problem list, and updated as needed.  Allergies Allergies[1]  Medications Current Medications[2]  Family history Family History[3]  Social history Tobacco Use History[4]  Surgical history Surgical History[5]  Preventive Care Health Maintenance  Topic Date Due  . DTaP/Tdap/Td Vaccines (1 - Tdap) 06/10/2008  . Adult RSV (50+ Years or Pregnancy) (1 - Risk 60-74 years 1-dose series) Never done  . ZOSTER VACCINE (2 of 2) 02/27/2022  . Bone Density Scan  12/27/2023  . Colorectal Cancer Screening  04/27/2024  . COVID-19 Vaccine (6 - 2025-26 season) 02/02/2025 (Originally 12/07/2023)  . Comprehensive Annual Visit  06/15/2024  . Depression Screening  10/18/2024  . Breast Cancer Screening (Mammogram)  11/01/2024  . Diabetes Screening  02/02/2025  . Medicare Annual Wellness Visit:  Medicare Advantage  Completed  . Influenza Vaccine  Completed  . Pneumococcal Vaccine for Ages 50+  Completed  . Hepatitis C Screening  Completed  . HIB Vaccines  Aged Out  . Hepatitis B Vaccines  Aged Out  . IPV Vaccines  Aged Out  . Hepatitis A Vaccines  Aged Out  . Meningococcal Conjugate (ACWY) Vaccine  Aged Out  . Rotavirus Vaccines  Aged Out  . HPV Vaccines  Aged Out  . Meningococcal B Vaccine  Aged Out     Immunization History  Administered Date(s) Administered  . Influenza, High-dose  Seasonal, Quadrivalent, Preservative Free 01/04/2020, 01/01/2021, 03/12/2022  . Influenza, Injectable, Quadrivalent, Preservative Free 01/26/2018  . Influenza, Unspecified 03/13/2010, 12/23/2013, 04/04/2015, 01/23/2016  . Influenza, high-dose, trivalent, PF 01/19/2019, 12/16/2022, 02/03/2024  . Pfizer SARS-CoV-2 Bivalent 12+ yrs 01/29/2021  . Pfizer SARS-CoV-2 Primary Series 12+ yrs 06/17/2019, 07/11/2019, 02/09/2020, 09/11/2020  . Pneumococcal Conjugate 13-Valent 11/13/2014  . Pneumococcal Polysaccharide Vaccine, 23 Valent (PNEUMOVAX-23) 2Y+ 07/14/2016  . TD vaccine (ADULT)PF(TENIVAC) 7Y+  06/09/2008  . Varicella Zoster [facility]) 18Y+ 01/02/2022     ROS  Review of Systems  Constitutional:  Negative for appetite change, chills and fever.  Eyes:  Negative for visual disturbance.  Respiratory:  Negative for chest tightness, shortness of breath and wheezing.   Cardiovascular:  Negative for chest pain and palpitations.  Gastrointestinal:  Negative for abdominal pain, blood in stool, diarrhea, nausea and vomiting.  Genitourinary:  Negative for difficulty urinating, dysuria, flank pain and hematuria.  Skin:  Negative for rash and wound.  Neurological:  Negative for dizziness, seizures, syncope and headaches.  Psychiatric/Behavioral:  Negative for agitation, behavioral problems, confusion, self-injury and suicidal ideas.   All other systems reviewed and are negative.   Review of Systems - All other systems reviewed are negative except as noted above.  OBJECTIVE  BP 112/62 (BP Location: Right arm, Patient Position: Sitting)   Pulse 57   Ht 1.727 m (5' 8)   Wt 108 kg (237 lb)   BMI 36.04 kg/m    PHYSICAL:  Physical Exam Vitals and nursing note reviewed.  Constitutional:      General: She is not in acute distress.    Appearance: Normal appearance.  HENT:     Head: Normocephalic and atraumatic.  Eyes:     Pupils: Pupils are equal, round, and reactive to light.  Neck:     Vascular: No carotid bruit.  Cardiovascular:     Rate and Rhythm: Normal rate and regular rhythm.     Heart sounds: No murmur heard.    No friction rub. No gallop.  Pulmonary:     Effort: Pulmonary effort is normal. No respiratory distress.     Breath sounds: No wheezing, rhonchi or rales.  Abdominal:     General: Bowel sounds are normal.     Palpations: Abdomen is soft.     Tenderness: There is no abdominal tenderness.  Musculoskeletal:     Cervical back: Normal range of motion and neck supple.     Right lower leg: No edema.     Left lower leg: No edema.  Skin:    General: Skin is  warm.     Findings: No rash.  Neurological:     General: No focal deficit present.     Mental Status: She is alert and oriented to person, place, and time.  Psychiatric:        Mood and Affect: Mood normal.        Behavior: Behavior normal.     ASSESSMENT/PLAN:  1. Gastroesophageal reflux disease, unspecified whether esophagitis present (Primary) Continue acid reflux diet and precautions, Advised patient decrease Nexium from 40 mg daily to 40 mg every other day and observe symptoms.  2. Essential hypertension BP Readings from Last 3 Encounters:  02/03/24 112/62  11/05/23 107/67  10/19/23 121/79  Continue lisinopril-hydrochlorothiazide  and metoprolol .  3. Stage 3a chronic kidney disease (HCC) Lab Results  Component Value Date   CREATININE 0.95 02/03/2024   BUN 14 02/03/2024   NA 141 02/03/2024   K 4.2 02/03/2024   CL  104 02/03/2024   CO2 29 02/03/2024  Stay well-hydrated, avoid nephrotoxin.  4. Hypercholesterolemia Lab Results  Component Value Date   LDLCALC 97 02/03/2024  Goal of LDL less than 70, will increase rosuvastatin to 20 mg daily.  5. Class 2 obesity due to excess calories without serious comorbidity with body mass index (BMI) of 35.0 to 35.9 in adult Wt Readings from Last 3 Encounters:  02/03/24 108 kg (237 lb)  11/05/23 108 kg (237 lb 1.6 oz)  10/19/23 105 kg (232 lb)  Body mass index is 36.04 kg/m. Advised low-calorie diet and exercise.  6. Pre-diabetes Lab Results  Component Value Date   HGBA1C 6.2 (H) 02/03/2024  Continue low sugar, low carbs diet and exercise.  7. Generalized anxiety disorder Continue stress release techniques, use Xanax  as needed.  8. Need for vaccination - Flu, High-Dose, Trivalent, PF IM   Goals of care discussed with patient including medication compliance and adequate follow up. Patient verbalizesunderstanding and in agreement with the above plan. All questions answered.   I agree the documentation is accurate and  complete.   Future Appointments  Date Time Provider Department Center  02/09/2024  9:50 AM Lynwood Arthea Plover, MD [facility] [address]  02/22/2024 10:20 AM Dekarlos Megail Dial, DPM [facility] PODI [facility]  04/19/2024 11:15 AM Norleen Harriett Crease, MD [facility] ORT GEN [facility]  06/28/2024 10:40 AM Talitha Flatten, MD [facility] PRE [facility]  07/01/2024 10:30 AM Hildegard Rinks, MD [facility] [address]   This document serves as a record of services personally performed by Dr. Flatten.  It was created on their behalf by Ronal LITTIE Ring, CMA, a trained medical scribe, and Certified Medical Assistant (CMA). During the course of documenting the history, physical exam and medical decision making, I was functioning as a stage manager. The creation of this record is the provider's dictation and/or activities during the visit.     This document was created using the aidof voice recognition Scientist, clinical (histocompatibility and immunogenetics).       [1] Allergies Allergen Reactions  . Hydrocodone-Acetaminophen  Psychosis  . Alendronate Other (See Comments) and GI Intolerance    Itchy all over, and made her feel funny. Nausea and constipation.   . Atorvastatin Other (See Comments)    Other reaction(s): MYALGIA  . Cephalexin  Other (See Comments)    Other reaction(s): ITCHING  . Levofloxacin Other (See Comments)    Headache  [2] Current Outpatient Medications  Medication Sig Dispense Refill  . aspirin  81 mg EC tablet Take  by mouth Once Daily.    . calcium carbonate (TUMS) 500 mg (200 mg calcium) chewable tablet Chew 1 tablet daily.    . cholecalciferol (VITAMIN D3) 1,000 unit (25 mcg) tablet 1,000 Units.    . cyanocobalamin (VITAMIN B12) 1,000 mcg tablet 1,000 mcg.    . denosumab (Prolia) 60 mg/mL syrg syringe Inject 60 mg under the skin once.    SABRA esomeprazole (NexIUM) 40 mg DR capsule Take 1 capsule (40 mg total) by mouth every morning before breakfast. 90 capsule 1  . lisinopriL-hydrochlorothiazide  (PRINZIDE) 20-25 mg per tablet  Take 1 tablet by mouth daily. 90 tablet 1  . metoprolol  tartrate (LOPRESSOR ) 50 mg tablet TAKE 1 TABLET BY MOUTH TWICE  DAILY 180 tablet 1  . rosuvastatin (CRESTOR) 10 mg tablet TAKE 1 TABLET BY MOUTH DAILY 90 tablet 1  . ALPRAZolam  (XANAX ) 0.25 mg tablet TAKE 1 TABLET(0.25 MG) BY MOUTH EVERY NIGHT AS NEEDED FOR SLEEP OR  ANXIETY 90 tablet 1  . fluticasone  propionate (FLONASE ) 50 mcg/spray nasal spray USE 1 TO 2 SPRAYS IN BOTH  NOSTRILS DAILY 48 g 1  . tobramycin (TOBREX) 0.3 % ophthalmic solution 1 drop.     No current facility-administered medications for this visit.  [3] Family History Problem Relation Name Age of Onset  . Cancer Mother  75       liver  . Heart attack Father    . Glaucoma Neg Hx    . Macular degeneration Neg Hx    [4] Social History Tobacco Use  Smoking Status Never  Smokeless Tobacco Never  [5] Past Surgical History: Procedure Laterality Date  . BREAST BIOPSY Left 2006   Procedure: BREAST BIOPSY; benign  . BREAST EXCISIONAL BIOPSY Right    Procedure: BREAST EXCISIONAL BIOPSY; age 20, benign  . BREAST SURGERY Bilateral    Procedure: BREAST SURGERY; Bx bilateral breast  . JOINT REPLACEMENT     Procedure: JOINT REPLACEMENT; Hip  . OTHER SURGICAL HISTORY Left    Procedure: OTHER SURGICAL HISTORY (Eylea injections ); Dr Jarold for [facility]  . TONSILLECTOMY     Procedure: TONSILLECTOMY  . 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   Procedure: REPLACEMENT TOTAL KNEE  . TURBINATE RESECTION Bilateral 10/29/2021   Procedure: TURBINATE REDUCTION;  Surgeon: Alm Edsel Georgina Mickey., MD;  Location: [facility] OUTPATIENT OR;  Service: ENT;  Laterality: Bilateral;

## 2024-02-18 NOTE — Progress Notes (Signed)
 Error

## 2024-02-29 ENCOUNTER — Ambulatory Visit

## 2024-02-29 NOTE — Therapy (Deleted)
 OUTPATIENT PHYSICAL THERAPY THORACOLUMBAR EVALUATION   Patient Name: Vanessa Blair MRN: [id number] DOB:19-Nov-1950, 73 y.o., female Today's Date: 02/29/2024  END OF SESSION:   Past Medical History:  Diagnosis Date   Acid reflux    Anemia    Anxiety    Arthritis    Hiatal hernia    Hypercholesteremia    Hypertension    Past Surgical History:  Procedure Laterality Date   BREAST SURGERY     NASAL SINUS SURGERY     TOTAL HIP ARTHROPLASTY     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   Procedure: TOTAL KNEE ARTHROPLASTY;  Surgeon: Melodi Lerner, MD;  Location: [facility]S;  Service: Orthopedics;  Laterality: Left;   TUBAL LIGATION     Patient Active Problem List   Diagnosis Date Noted   OA (osteoarthritis) of knee 07/18/2019   Primary osteoarthritis of left knee 07/18/2019    PCP: Alyse Bradley, MD  REFERRING PROVIDER: Burnetta Aures, Md, [facility]  REFERRING DIAG: thoracic spine pain with scoliosis  Rationale for Evaluation and Treatment: Rehabilitation  THERAPY DIAG:  No diagnosis found.  ONSET DATE: chronic, referred 01/28/24  SUBJECTIVE:                                                                                                                                                                                           SUBJECTIVE STATEMENT: ***  PERTINENT HISTORY:  H/o R THA, L TKA chronic back pain, participated with PT a few months ago for back pain with good results. Referred again by [facility] for another round of PT  PAIN:  Are you having pain? {OPRCPAIN:27236}  PRECAUTIONS: Fall  RED FLAGS: None   WEIGHT BEARING RESTRICTIONS: No  FALLS:  Has patient fallen in last 6 months? {fallsyesno:27318}  LIVING ENVIRONMENT: Lives with: {OPRC lives with:25569::lives with their family} Lives in: {Lives in:25570} Stairs: {opstairs:27293} Has following equipment at home: {Assistive devices:23999}  OCCUPATION: ***  PLOF: {PLOF:24004}  PATIENT  GOALS: ***  NEXT MD VISIT: none scheduled currently  OBJECTIVE:  Note: Objective measures were completed at Evaluation unless otherwise noted.  DIAGNOSTIC FINDINGS:  No recent x rays  PATIENT SURVEYS:  Modified Oswestry:  COGNITION: Overall cognitive status: Within functional limits for tasks assessed     SENSATION: WFL  MUSCLE LENGTH: Hamstrings: Right *** deg; Left *** deg Debby test: Right *** deg; Left *** deg  POSTURE: {posture:25561}  PALPATION: ***  LUMBAR ROM:   AROM eval  Flexion   Extension   Right lateral flexion   Left lateral flexion   Right rotation   Left rotation    (Blank rows = not  tested)  LOWER EXTREMITY ROM:     {AROM/PROM:27142}  Right eval Left eval  Hip flexion    Hip extension    Hip abduction    Hip adduction    Hip internal rotation    Hip external rotation    Knee flexion    Knee extension    Ankle dorsiflexion    Ankle plantarflexion    Ankle inversion    Ankle eversion     (Blank rows = not tested)  LOWER EXTREMITY MMT:    MMT Right eval Left eval  Hip flexion    Hip extension    Hip abduction    Hip adduction    Hip internal rotation    Hip external rotation    Knee flexion    Knee extension    Ankle dorsiflexion    Ankle plantarflexion    Ankle inversion    Ankle eversion     (Blank rows = not tested)  LUMBAR SPECIAL TESTS:  {lumbar special test:25242}  FUNCTIONAL TESTS:  {Functional tests:24029}  GAIT: Distance walked: *** Assistive device utilized: {Assistive devices:23999} Level of assistance: {Levels of assistance:24026} Comments: ***  TREATMENT DATE: 02/29/24: Evaluation, education in POC, goals                                                                                                                                 PATIENT EDUCATION:  Education details: POC, goals Person educated: Patient Education method: Programmer, Multimedia, Demonstration, Actor cues, Verbal cues, and  Handouts Education comprehension: verbalized understanding  HOME EXERCISE PROGRAM: ***  ASSESSMENT:  CLINICAL IMPRESSION: Patient is a 73 y.o. female who was evaluated today by physical therapy due to .   OBJECTIVE IMPAIRMENTS: {opptimpairments:25111}.   ACTIVITY LIMITATIONS: {activitylimitations:27494}  PARTICIPATION LIMITATIONS: {participationrestrictions:25113}  PERSONAL FACTORS: {Personal factors:25162} are also affecting patient's functional outcome.   REHAB POTENTIAL: Good  CLINICAL DECISION MAKING: Evolving/moderate complexity  EVALUATION COMPLEXITY: Moderate   GOALS: Goals reviewed with patient? Yes  SHORT TERM GOALS: Target date: 2 weeks , 03/14/24  *** Baseline: Goal status: INITIAL   LONG TERM GOALS: Target date: 8 weeks, 04/25/24  *** Baseline:  Goal status: INITIAL  2.  *** Baseline:  Goal status: INITIAL  3.  *** Baseline:  Goal status: INITIAL  4.  *** Baseline:  Goal status: INITIAL  5.  *** Baseline:  Goal status: INITIAL  6.  *** Baseline:  Goal status: INITIAL  PLAN:  PT FREQUENCY: {rehab frequency:25116}  PT DURATION: 8 weeks  PLANNED INTERVENTIONS: 97110-Therapeutic exercises, 97530- Therapeutic activity, [id number]- Neuromuscular re-education, 97535- Self Care, 02859- Manual therapy, 02966- Ionotophoresis 4mg /ml Dexamethasone , and Patient/Family education.  PLAN FOR NEXT SESSION: ***   Florita Nitsch L Elease Swarm, PT 02/29/2024, 9:15 AM

## 2024-03-28 ENCOUNTER — Encounter: Payer: Self-pay | Admitting: Physical Therapy

## 2024-03-28 ENCOUNTER — Other Ambulatory Visit: Payer: Self-pay

## 2024-03-28 ENCOUNTER — Ambulatory Visit: Admitting: Physical Therapy

## 2024-03-28 DIAGNOSIS — M546 Pain in thoracic spine: Secondary | ICD-10-CM | POA: Insufficient documentation

## 2024-03-28 DIAGNOSIS — M5459 Other low back pain: Secondary | ICD-10-CM | POA: Diagnosis present

## 2024-03-28 DIAGNOSIS — M542 Cervicalgia: Secondary | ICD-10-CM | POA: Diagnosis present

## 2024-03-28 DIAGNOSIS — M25511 Pain in right shoulder: Secondary | ICD-10-CM | POA: Insufficient documentation

## 2024-03-28 DIAGNOSIS — G8929 Other chronic pain: Secondary | ICD-10-CM | POA: Insufficient documentation

## 2024-03-28 DIAGNOSIS — M25512 Pain in left shoulder: Secondary | ICD-10-CM | POA: Insufficient documentation

## 2024-03-28 NOTE — Therapy (Signed)
 " OUTPATIENT PHYSICAL THERAPY THORACOLUMBAR EVALUATION   Patient Name: Vanessa Blair MRN: [id number] DOB:12-21-1950, 73 y.o., female Today's Date: 03/28/2024  END OF SESSION:  PT End of Session - 03/28/24 1512     Visit Number 1    Number of Visits 10    Date for Recertification  05/23/24    PT Start Time 1447    PT Stop Time 1530    PT Time Calculation (min) 43 min    Activity Tolerance Patient tolerated treatment well    Behavior During Therapy WFL for tasks assessed/performed          Past Medical History:  Diagnosis Date   Acid reflux    Anemia    Anxiety    Arthritis    Hiatal hernia    Hypercholesteremia    Hypertension    Past Surgical History:  Procedure Laterality Date   BREAST SURGERY     NASAL SINUS SURGERY     TOTAL HIP ARTHROPLASTY     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   Procedure: TOTAL KNEE ARTHROPLASTY;  Surgeon: Melodi Lerner, MD;  Location: [facility];  Service: Orthopedics;  Laterality: Left;   TUBAL LIGATION     Patient Active Problem List   Diagnosis Date Noted   OA (osteoarthritis) of knee 07/18/2019   Primary osteoarthritis of left knee 07/18/2019    PCP: Slater Sole, MD  REFERRING PROVIDER: Donaciano Sprang, MD  REFERRING DIAG: M54.9 (ICD-10-CM) - Dorsalgia, unspecified   Rationale for Evaluation and Treatment: Rehabilitation  THERAPY DIAG:  Other low back pain  Pain in thoracic spine  Cervicalgia  Chronic pain of both shoulders  ONSET DATE: prev MD visit  SUBJECTIVE:                                                                                                                                                                                           SUBJECTIVE STATEMENT: Pt states that she has had chronic low back pain which has been episodic in nature, also endorses pain in the upper thoracic spine, bilateral shoulders, and cervical spine. Pt reports good outcomes with previous therapy, notes that she had 3 months of  relief following dry needling. Pt states that her symptoms have been overall worsening, varies depending on the day. Symptoms range in intensity from 0/10-9/10. Symptoms are activity/position dependent. Denies radicular symptoms.   PERTINENT HISTORY:  Hx scoliosis  Osteoporosis  PAIN:  Are you having pain? Yes: NPRS scale: 6/10 Pain location: low back, thoracic spine, Bil shoulders Pain description: ache, soreness, dull Aggravating factors: static positioning Relieving factors: rest, tylenol   PRECAUTIONS: None  RED FLAGS:  None   WEIGHT BEARING RESTRICTIONS: No  FALLS:  Has patient fallen in last 6 months? No  LIVING ENVIRONMENT: Lives with: lives alone Lives in: House/apartment Stairs: No Has following equipment at home: None  OCCUPATION: retired  PLOF: Independent  PATIENT GOALS: walk better, improved posture  NEXT MD VISIT: PRN  OBJECTIVE:  Note: Objective measures were completed at Evaluation unless otherwise noted.  DIAGNOSTIC FINDINGS:  See imaging section of chart  PATIENT SURVEYS:  Modified Oswestry:  MODIFIED OSWESTRY DISABILITY SCALE  Date: 03/28/24 Score  Pain intensity 3 =  Pain medication provides me with moderate relief from pain.  2. Personal care (washing, dressing, etc.) 0 =  I can take care of myself normally without causing increased pain.  3. Lifting 3 = Pain prevents me from lifting heavy weights, but I can manage light to medium weights if they are conveniently positioned  4. Walking 1 = Pain prevents me from walking more than 1 mile.  5. Sitting 2 =  Pain prevents me from sitting more than 1 hour.  6. Standing 3 =  Pain prevents me from standing more than 1/2 hour.  7. Sleeping 2 =  Even when I take pain medication, I sleep less than 6 hours  8. Social Life 2 = Pain prevents me from participating in more energetic activities (eg. sports, dancing).  9. Traveling 2 =  My pain restricts my travel over 2 hours.  10. Employment/ Homemaking 2 =  I can perform most of my homemaking/job duties, but pain prevents me from performing more physically stressful activities (eg, lifting, vacuuming).  Total 20/50   Interpretation of scores: Score Category Description  0-20% Minimal Disability The patient can cope with most living activities. Usually no treatment is indicated apart from advice on lifting, sitting and exercise  21-40% Moderate Disability The patient experiences more pain and difficulty with sitting, lifting and standing. Travel and social life are more difficult and they may be disabled from work. Personal care, sexual activity and sleeping are not grossly affected, and the patient can usually be managed by conservative means  41-60% Severe Disability Pain remains the main problem in this group, but activities of daily living are affected. These patients require a detailed investigation  61-80% Crippled Back pain impinges on all aspects of the patients life. Positive intervention is required  81-100% Bed-bound These patients are either bed-bound or exaggerating their symptoms  Bluford FORBES Zoe DELENA Karon DELENA, et al. Surgery versus conservative management of stable thoracolumbar fracture: the PRESTO feasibility RCT. [place] ([place]): [employer]; 2021 Nov. [facility] Technology Assessment, No. 25.62.) Appendix 3, Oswestry Disability Index category descriptors. Available from: [email or web address].cz  Minimally Clinically Important Difference (MCID) = 12.8%  COGNITION: Overall cognitive status: Within functional limits for tasks assessed     SENSATION: WFL   POSTURE: rounded shoulders, forward head, and weight shift right  PALPATION: Inc TTP along right upper trap,no deformities noted   LUMBAR ROM:   AROM eval  Flexion Nil loss  Extension 25% loss, central low back pain  Right lateral flexion 25% loss, right low back pain  Left lateral flexion 25% loss, left low back pain  Right rotation    Left rotation    (Blank rows = not tested)  LOWER EXTREMITY ROM:   WFL BLE  Active  Right eval Left eval  Hip flexion    Hip extension    Hip abduction    Hip adduction    Hip internal rotation  Hip external rotation    Knee flexion    Knee extension    Ankle dorsiflexion    Ankle plantarflexion    Ankle inversion    Ankle eversion     (Blank rows = not tested)  LOWER EXTREMITY MMT:    MMT Right eval Left eval  Hip flexion 4-/5 4+/5  Hip extension    Hip abduction 4-/5 4-/5  Hip adduction 4+/5 4+/5  Hip internal rotation    Hip external rotation    Knee flexion 4/5 4/5  Knee extension 4+/5 4+/5  Ankle dorsiflexion 5/5 5/5  Ankle plantarflexion 5/5 5/5  Ankle inversion    Ankle eversion     (Blank rows = not tested)     TREATMENT DATE:   03/28/24- EVAL Repeated lumbar extension in standing x10: decreased symptoms of left side glide in standing  HEP developed and provided, educated on rationale                                                                                                                                  PATIENT EDUCATION:  Education details: Pt educated on relevant anatomy, physiology, pathology, diagnosis, prognosis, progression of care, pain and activity modification related to dorsal pain Person educated: Patient Education method: Explanation, Demonstration, and Handouts Education comprehension: verbalized understanding and returned demonstration  HOME EXERCISE PROGRAM: Access Code: [email or web address] URL: [email or web address] Date: 03/28/2024 Prepared by: Stann Ohara  Exercises - Standing Lumbar Extension  - 3 x daily - 7 x weekly - 1 sets - 10 reps - 2 hold  ASSESSMENT:  CLINICAL IMPRESSION: Patient is a 73 y.o. F who was seen today for physical therapy evaluation and treatment for dorsalgia most evident in upper thoracic spine, cervical spine, and lumbar spine. Pt symptoms are consistent with impingement of the  lumbar spine with directional preference of lumbar extension in standing. There is inc resting tension and TTP in upper traps L>R likely consistent with guarding and inc demand for muscle activity in abnormal posturing. Pt stands to benefit from continued skilled physical therapy to address deficit areas and restore safety with activities and participations at home and in the community.     OBJECTIVE IMPAIRMENTS: decreased activity tolerance, decreased endurance, decreased ROM, decreased strength, increased fascial restrictions, impaired perceived functional ability, increased muscle spasms, postural dysfunction, and pain.   ACTIVITY LIMITATIONS: carrying, lifting, bending, sitting, standing, and squatting  PARTICIPATION LIMITATIONS: meal prep, cleaning, laundry, driving, shopping, community activity, and yard work  PERSONAL FACTORS: Age, Past/current experiences, and Time since onset of injury/illness/exacerbation are also affecting patient's functional outcome.   REHAB POTENTIAL: Good  CLINICAL DECISION MAKING: Stable/uncomplicated  EVALUATION COMPLEXITY: Low   GOALS: Goals reviewed with patient? Yes  SHORT TERM GOALS: Target date: 04/28/24   Pt will report compliance with HEP to work towards ind and home management strategies Baseline: Goal status: INITIAL   2.  Pt will score no greater than 10/50 on  ODI to demonstrate improved activity tolerance Baseline: 20/50 Goal status: INITIAL   3.  Pt will improve lumbar spine ROM to full and painless in order to demonstrate progress towards activity tolerance and improved function Baseline:  Goal status: INITIAL   4.  Pt will report no greater than 5/10 pain throughout spine or bilateral shoulders to demonstrate improved tolerance to activity and decreased guarding response Baseline:  Goal status: INITIAL      LONG TERM GOALS: Target date: 05/23/24   Pt will score no greater than 0/50 on ODI to demonstrate improved activity  tolerance Baseline:  Goal status: INITIAL   2.  Pt will report no greater than 2/10 pain over 7 consecutive days to demonstrate maintained reduction in symptoms and improved tolerance to activity Baseline:  Goal status: INITIAL   3.  Pt will be ind in the management of their symptoms at home and in the community Baseline:  Goal status: INITIAL   PLAN:  PT FREQUENCY: 1-2x/week  PT DURATION: 8 weeks  PLANNED INTERVENTIONS: 97110-Therapeutic exercises, 97530- Therapeutic activity, [id number]- Neuromuscular re-education, 97535- Self Care, 02859- Manual therapy, [id number]- Electrical stimulation (unattended), 20560 (1-2 muscles), 20561 (3+ muscles)- Dry Needling, Patient/Family education, Cryotherapy, and Moist heat.  PLAN FOR NEXT SESSION: progress functional activity tolerance, strength, stability, endurance, postural education, soft tissue mobilization   Stann DELENA Ohara, PT 03/28/2024, 3:16 PM  "

## 2024-04-12 ENCOUNTER — Ambulatory Visit: Attending: Orthopedic Surgery | Admitting: Physical Therapy

## 2024-04-12 ENCOUNTER — Encounter: Payer: Self-pay | Admitting: Physical Therapy

## 2024-04-12 DIAGNOSIS — M25512 Pain in left shoulder: Secondary | ICD-10-CM | POA: Diagnosis present

## 2024-04-12 DIAGNOSIS — R293 Abnormal posture: Secondary | ICD-10-CM | POA: Insufficient documentation

## 2024-04-12 DIAGNOSIS — M5459 Other low back pain: Secondary | ICD-10-CM | POA: Insufficient documentation

## 2024-04-12 DIAGNOSIS — M25511 Pain in right shoulder: Secondary | ICD-10-CM | POA: Diagnosis present

## 2024-04-12 DIAGNOSIS — M6281 Muscle weakness (generalized): Secondary | ICD-10-CM | POA: Diagnosis present

## 2024-04-12 DIAGNOSIS — M542 Cervicalgia: Secondary | ICD-10-CM | POA: Diagnosis present

## 2024-04-12 DIAGNOSIS — M546 Pain in thoracic spine: Secondary | ICD-10-CM | POA: Diagnosis present

## 2024-04-12 DIAGNOSIS — G8929 Other chronic pain: Secondary | ICD-10-CM | POA: Insufficient documentation

## 2024-04-12 NOTE — Therapy (Signed)
 " OUTPATIENT PHYSICAL THERAPY THORACOLUMBAR TREATMENT   Patient Name: Vanessa Blair MRN: [id number] DOB:[birth year], 74 y.o., female Today's Date: 04/12/2024  END OF SESSION:  PT End of Session - 04/12/24 1350     Visit Number 2    Date for Recertification  05/23/24    PT Start Time 1350    PT Stop Time 1430    PT Time Calculation (min) 40 min    Activity Tolerance Patient tolerated treatment well    Behavior During Therapy WFL for tasks assessed/performed          Past Medical History:  Diagnosis Date   Acid reflux    Anemia    Anxiety    Arthritis    Hiatal hernia    Hypercholesteremia    Hypertension    Past Surgical History:  Procedure Laterality Date   BREAST SURGERY     NASAL SINUS SURGERY     TOTAL HIP ARTHROPLASTY     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   Procedure: TOTAL KNEE ARTHROPLASTY;  Surgeon: Melodi Lerner, MD;  Location: [facility];  Service: Orthopedics;  Laterality: Left;   TUBAL LIGATION     Patient Active Problem List   Diagnosis Date Noted   OA (osteoarthritis) of knee 07/18/2019   Primary osteoarthritis of left knee 07/18/2019    PCP: Slater Sole, MD  REFERRING PROVIDER: Donaciano Sprang, MD  REFERRING DIAG: M54.9 (ICD-10-CM) - Dorsalgia, unspecified   Rationale for Evaluation and Treatment: Rehabilitation  THERAPY DIAG:  Other low back pain  Pain in thoracic spine  Cervicalgia  Chronic pain of both shoulders  Abnormal posture  ONSET DATE: prev MD visit  SUBJECTIVE:                                                                                                                                                                                           SUBJECTIVE STATEMENT:  I feel all right  Pt states that she has had chronic low back pain which has been episodic in nature, also endorses pain in the upper thoracic spine, bilateral shoulders, and cervical spine. Pt reports good outcomes with previous therapy, notes that she had  3 months of relief following dry needling. Pt states that her symptoms have been overall worsening, varies depending on the day. Symptoms range in intensity from 0/10-9/10. Symptoms are activity/position dependent. Denies radicular symptoms.   PERTINENT HISTORY:  Hx scoliosis  Osteoporosis  PAIN:  Are you having pain? Yes: NPRS scale: 8/10 Pain location: low back, thoracic spine, Bil shoulders Pain description: ache, soreness, dull Aggravating factors: static positioning Relieving factors: rest, tylenol   PRECAUTIONS: None  RED FLAGS: None   WEIGHT BEARING RESTRICTIONS: No  FALLS:  Has patient fallen in last 6 months? No  LIVING ENVIRONMENT: Lives with: lives alone Lives in: House/apartment Stairs: No Has following equipment at home: None  OCCUPATION: retired  PLOF: Independent  PATIENT GOALS: walk better, improved posture  NEXT MD VISIT: PRN  OBJECTIVE:  Note: Objective measures were completed at Evaluation unless otherwise noted.  DIAGNOSTIC FINDINGS:  See imaging section of chart  PATIENT SURVEYS:  Modified Oswestry:  MODIFIED OSWESTRY DISABILITY SCALE  Date: 03/28/24 Score  Pain intensity 3 =  Pain medication provides me with moderate relief from pain.  2. Personal care (washing, dressing, etc.) 0 =  I can take care of myself normally without causing increased pain.  3. Lifting 3 = Pain prevents me from lifting heavy weights, but I can manage light to medium weights if they are conveniently positioned  4. Walking 1 = Pain prevents me from walking more than 1 mile.  5. Sitting 2 =  Pain prevents me from sitting more than 1 hour.  6. Standing 3 =  Pain prevents me from standing more than 1/2 hour.  7. Sleeping 2 =  Even when I take pain medication, I sleep less than 6 hours  8. Social Life 2 = Pain prevents me from participating in more energetic activities (eg. sports, dancing).  9. Traveling 2 =  My pain restricts my travel over 2 hours.  10. Employment/  Homemaking 2 = I can perform most of my homemaking/job duties, but pain prevents me from performing more physically stressful activities (eg, lifting, vacuuming).  Total 20/50   Interpretation of scores: Score Category Description  0-20% Minimal Disability The patient can cope with most living activities. Usually no treatment is indicated apart from advice on lifting, sitting and exercise  21-40% Moderate Disability The patient experiences more pain and difficulty with sitting, lifting and standing. Travel and social life are more difficult and they may be disabled from work. Personal care, sexual activity and sleeping are not grossly affected, and the patient can usually be managed by conservative means  41-60% Severe Disability Pain remains the main problem in this group, but activities of daily living are affected. These patients require a detailed investigation  61-80% Crippled Back pain impinges on all aspects of the patients life. Positive intervention is required  81-100% Bed-bound These patients are either bed-bound or exaggerating their symptoms  Bluford FORBES Zoe DELENA Karon DELENA, et al. Surgery versus conservative management of stable thoracolumbar fracture: the PRESTO feasibility RCT. [place] ([place]): [employer]; 2021 Nov. [facility] Technology Assessment, No. 25.62.) Appendix 3, Oswestry Disability Index category descriptors. Available from: [email or web address]  Minimally Clinically Important Difference (MCID) = 12.8%  COGNITION: Overall cognitive status: Within functional limits for tasks assessed     SENSATION: WFL   POSTURE: rounded shoulders, forward head, and weight shift right  PALPATION: Inc TTP along right upper trap,no deformities noted   LUMBAR ROM:   AROM eval  Flexion Nil loss  Extension 25% loss, central low back pain  Right lateral flexion 25% loss, right low back pain  Left lateral flexion 25% loss, left low back pain   Right rotation   Left rotation    (Blank rows = not tested)  LOWER EXTREMITY ROM:   WFL BLE  Active  Right eval Left eval  Hip flexion    Hip extension    Hip abduction    Hip adduction    Hip internal rotation  Hip external rotation    Knee flexion    Knee extension    Ankle dorsiflexion    Ankle plantarflexion    Ankle inversion    Ankle eversion     (Blank rows = not tested)  LOWER EXTREMITY MMT:    MMT Right eval Left eval  Hip flexion 4-/5 4+/5  Hip extension    Hip abduction 4-/5 4-/5  Hip adduction 4+/5 4+/5  Hip internal rotation    Hip external rotation    Knee flexion 4/5 4/5  Knee extension 4+/5 4+/5  Ankle dorsiflexion 5/5 5/5  Ankle plantarflexion 5/5 5/5  Ankle inversion    Ankle eversion     (Blank rows = not tested)     TREATMENT DATE:  04/12/24 NuStep L 5 x 6 min Sit to stand 2x10 Shoulder Ext 5lb 2x10 Rows 10lb 2x10 HS curls 25lb 2x10   03/28/24- EVAL Repeated lumbar extension in standing x10: decreased symptoms of left side glide in standing  HEP developed and provided, educated on rationale                                                                                                                                  PATIENT EDUCATION:  Education details: Pt educated on relevant anatomy, physiology, pathology, diagnosis, prognosis, progression of care, pain and activity modification related to dorsal pain Person educated: Patient Education method: Explanation, Demonstration, and Handouts Education comprehension: verbalized understanding and returned demonstration  HOME EXERCISE PROGRAM: Access Code: [email or web address] URL: [email or web address] Date: 03/28/2024 Prepared by: Stann Ohara  Exercises - Standing Lumbar Extension  - 3 x daily - 7 x weekly - 1 sets - 10 reps - 2 hold  ASSESSMENT:  CLINICAL IMPRESSION: Patient is a 74 y.o. F who was seen today for physical therapy treatment for dorsalgia most evident in  upper thoracic spine, cervical spine, and lumbar spine. Initiated a progression to postural strengthening. Tactile cue for posture required with tows and extensions. Increase fatigue present with sit to stands. . Pt stands to benefit from continued skilled physical therapy to address deficit areas and restore safety with activities and participations at home and in the community.     OBJECTIVE IMPAIRMENTS: decreased activity tolerance, decreased endurance, decreased ROM, decreased strength, increased fascial restrictions, impaired perceived functional ability, increased muscle spasms, postural dysfunction, and pain.   ACTIVITY LIMITATIONS: carrying, lifting, bending, sitting, standing, and squatting  PARTICIPATION LIMITATIONS: meal prep, cleaning, laundry, driving, shopping, community activity, and yard work  PERSONAL FACTORS: Age, Past/current experiences, and Time since onset of injury/illness/exacerbation are also affecting patient's functional outcome.   REHAB POTENTIAL: Good  CLINICAL DECISION MAKING: Stable/uncomplicated  EVALUATION COMPLEXITY: Low   GOALS: Goals reviewed with patient? Yes  SHORT TERM GOALS: Target date: 04/28/24   Pt will report compliance with HEP to work towards ind and home management strategies Baseline: Goal status: INITIAL   2.  Pt will score no greater than  10/50 on ODI to demonstrate improved activity tolerance Baseline: 20/50 Goal status: INITIAL   3.  Pt will improve lumbar spine ROM to full and painless in order to demonstrate progress towards activity tolerance and improved function Baseline:  Goal status: INITIAL   4.  Pt will report no greater than 5/10 pain throughout spine or bilateral shoulders to demonstrate improved tolerance to activity and decreased guarding response Baseline:  Goal status: INITIAL      LONG TERM GOALS: Target date: 05/23/24   Pt will score no greater than 0/50 on ODI to demonstrate improved activity  tolerance Baseline:  Goal status: INITIAL   2.  Pt will report no greater than 2/10 pain over 7 consecutive days to demonstrate maintained reduction in symptoms and improved tolerance to activity Baseline:  Goal status: INITIAL   3.  Pt will be ind in the management of their symptoms at home and in the community Baseline:  Goal status: INITIAL   PLAN:  PT FREQUENCY: 1-2x/week  PT DURATION: 8 weeks  PLANNED INTERVENTIONS: 97110-Therapeutic exercises, 97530- Therapeutic activity, [id number]- Neuromuscular re-education, 97535- Self Care, 02859- Manual therapy, [id number]- Electrical stimulation (unattended), 20560 (1-2 muscles), 20561 (3+ muscles)- Dry Needling, Patient/Family education, Cryotherapy, and Moist heat.  PLAN FOR NEXT SESSION: progress functional activity tolerance, strength, stability, endurance, postural education, soft tissue mobilization   Tanda KANDICE Sorrow, PTA 04/12/2024, 1:50 PM  "

## 2024-04-14 ENCOUNTER — Encounter: Payer: Self-pay | Admitting: Physical Therapy

## 2024-04-14 ENCOUNTER — Ambulatory Visit: Admitting: Physical Therapy

## 2024-04-14 DIAGNOSIS — M5459 Other low back pain: Secondary | ICD-10-CM | POA: Diagnosis not present

## 2024-04-14 DIAGNOSIS — M546 Pain in thoracic spine: Secondary | ICD-10-CM

## 2024-04-14 DIAGNOSIS — M542 Cervicalgia: Secondary | ICD-10-CM

## 2024-04-14 NOTE — Therapy (Signed)
 " OUTPATIENT PHYSICAL THERAPY THORACOLUMBAR TREATMENT   Patient Name: Vanessa Blair MRN: [id number] DOB:Dec 10, 1950, 74 y.o., female Today's Date: 04/14/2024  END OF SESSION:  PT End of Session - 04/14/24 1343     Visit Number 3    Date for Recertification  05/23/24    PT Start Time 1345    PT Stop Time 1430    PT Time Calculation (min) 45 min    Activity Tolerance Patient tolerated treatment well    Behavior During Therapy WFL for tasks assessed/performed          Past Medical History:  Diagnosis Date   Acid reflux    Anemia    Anxiety    Arthritis    Hiatal hernia    Hypercholesteremia    Hypertension    Past Surgical History:  Procedure Laterality Date   BREAST SURGERY     NASAL SINUS SURGERY     TOTAL HIP ARTHROPLASTY     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   Procedure: TOTAL KNEE ARTHROPLASTY;  Surgeon: Melodi Lerner, MD;  Location: [facility];  Service: Orthopedics;  Laterality: Left;   TUBAL LIGATION     Patient Active Problem List   Diagnosis Date Noted   OA (osteoarthritis) of knee 07/18/2019   Primary osteoarthritis of left knee 07/18/2019    PCP: Slater Sole, MD  REFERRING PROVIDER: Donaciano Sprang, MD  REFERRING DIAG: M54.9 (ICD-10-CM) - Dorsalgia, unspecified   Rationale for Evaluation and Treatment: Rehabilitation  THERAPY DIAG:  Other low back pain  Pain in thoracic spine  Cervicalgia  ONSET DATE: prev MD visit  SUBJECTIVE:                                                                                                                                                                                           SUBJECTIVE STATEMENT: Im alright  Pt states that she has had chronic low back pain which has been episodic in nature, also endorses pain in the upper thoracic spine, bilateral shoulders, and cervical spine. Pt reports good outcomes with previous therapy, notes that she had 3 months of relief following dry needling. Pt states that her  symptoms have been overall worsening, varies depending on the day. Symptoms range in intensity from 0/10-9/10. Symptoms are activity/position dependent. Denies radicular symptoms.   PERTINENT HISTORY:  Hx scoliosis  Osteoporosis  PAIN:  Are you having pain? Yes: NPRS scale: 7/10 Pain location: low back, thoracic spine, Bil shoulders Pain description: ache, soreness, dull Aggravating factors: static positioning Relieving factors: rest, tylenol   PRECAUTIONS: None  RED FLAGS: None   WEIGHT BEARING RESTRICTIONS: No  FALLS:  Has patient fallen in last 6 months? No  LIVING ENVIRONMENT: Lives with: lives alone Lives in: House/apartment Stairs: No Has following equipment at home: None  OCCUPATION: retired  PLOF: Independent  PATIENT GOALS: walk better, improved posture  NEXT MD VISIT: PRN  OBJECTIVE:  Note: Objective measures were completed at Evaluation unless otherwise noted.  DIAGNOSTIC FINDINGS:  See imaging section of chart  PATIENT SURVEYS:  Modified Oswestry:  MODIFIED OSWESTRY DISABILITY SCALE  Date: 03/28/24 Score  Pain intensity 3 =  Pain medication provides me with moderate relief from pain.  2. Personal care (washing, dressing, etc.) 0 =  I can take care of myself normally without causing increased pain.  3. Lifting 3 = Pain prevents me from lifting heavy weights, but I can manage light to medium weights if they are conveniently positioned  4. Walking 1 = Pain prevents me from walking more than 1 mile.  5. Sitting 2 =  Pain prevents me from sitting more than 1 hour.  6. Standing 3 =  Pain prevents me from standing more than 1/2 hour.  7. Sleeping 2 =  Even when I take pain medication, I sleep less than 6 hours  8. Social Life 2 = Pain prevents me from participating in more energetic activities (eg. sports, dancing).  9. Traveling 2 =  My pain restricts my travel over 2 hours.  10. Employment/ Homemaking 2 = I can perform most of my homemaking/job duties,  but pain prevents me from performing more physically stressful activities (eg, lifting, vacuuming).  Total 20/50   Interpretation of scores: Score Category Description  0-20% Minimal Disability The patient can cope with most living activities. Usually no treatment is indicated apart from advice on lifting, sitting and exercise  21-40% Moderate Disability The patient experiences more pain and difficulty with sitting, lifting and standing. Travel and social life are more difficult and they may be disabled from work. Personal care, sexual activity and sleeping are not grossly affected, and the patient can usually be managed by conservative means  41-60% Severe Disability Pain remains the main problem in this group, but activities of daily living are affected. These patients require a detailed investigation  61-80% Crippled Back pain impinges on all aspects of the patients life. Positive intervention is required  81-100% Bed-bound These patients are either bed-bound or exaggerating their symptoms  Bluford FORBES Zoe DELENA Karon DELENA, et al. Surgery versus conservative management of stable thoracolumbar fracture: the PRESTO feasibility RCT. [place] ([place]): [employer]; 2021 Nov. [facility] Technology Assessment, No. 25.62.) Appendix 3, Oswestry Disability Index category descriptors. Available from: [email or web address].cz  Minimally Clinically Important Difference (MCID) = 12.8%  COGNITION: Overall cognitive status: Within functional limits for tasks assessed     SENSATION: WFL   POSTURE: rounded shoulders, forward head, and weight shift right  PALPATION: Inc TTP along right upper trap,no deformities noted   LUMBAR ROM:   AROM eval  Flexion Nil loss  Extension 25% loss, central low back pain  Right lateral flexion 25% loss, right low back pain  Left lateral flexion 25% loss, left low back pain  Right rotation   Left rotation    (Blank rows = not  tested)  LOWER EXTREMITY ROM:   WFL BLE  Active  Right eval Left eval  Hip flexion    Hip extension    Hip abduction    Hip adduction    Hip internal rotation    Hip external rotation    Knee flexion  Knee extension    Ankle dorsiflexion    Ankle plantarflexion    Ankle inversion    Ankle eversion     (Blank rows = not tested)  LOWER EXTREMITY MMT:    MMT Right eval Left eval  Hip flexion 4-/5 4+/5  Hip extension    Hip abduction 4-/5 4-/5  Hip adduction 4+/5 4+/5  Hip internal rotation    Hip external rotation    Knee flexion 4/5 4/5  Knee extension 4+/5 4+/5  Ankle dorsiflexion 5/5 5/5  Ankle plantarflexion 5/5 5/5  Ankle inversion    Ankle eversion     (Blank rows = not tested)     TREATMENT DATE:  04/15/23 NuStep L 5 x 6 min STM to UT and cervical spine Shoulder Ext 5lb 2x10 Rows 10lb 2x10 HS curls 25lb 2x10 Leg Ext 5lb 2x10 Horizontal abduction green 2x10  Seated OHP yellow ball 2x10 Sit to stands x10   04/12/24 NuStep L 5 x 6 min Sit to stand 2x10 Shoulder Ext 5lb 2x10 Rows 10lb 2x10 HS curls 25lb 2x10   03/28/24- EVAL Repeated lumbar extension in standing x10: decreased symptoms of left side glide in standing  HEP developed and provided, educated on rationale                                                                                                                                  PATIENT EDUCATION:  Education details: Pt educated on relevant anatomy, physiology, pathology, diagnosis, prognosis, progression of care, pain and activity modification related to dorsal pain Person educated: Patient Education method: Explanation, Demonstration, and Handouts Education comprehension: verbalized understanding and returned demonstration  HOME EXERCISE PROGRAM: Access Code: [email or web address] URL: [email or web address] Date: 03/28/2024 Prepared by: Stann Ohara  Exercises - Standing Lumbar Extension  - 3 x daily - 7 x weekly - 1  sets - 10 reps - 2 hold  ASSESSMENT:  CLINICAL IMPRESSION: Patient is a 74 y.o. F who was seen today for physical therapy treatment for dorsalgia most evident in upper thoracic spine, cervical spine, and lumbar spine. Continues with postural strength while adding STM. Positive response to STM evident by improved tissue elasticity and ease of motion. Tactile cue for posture required with rows and extensions. Increase fatigue present with sit to stands only completing one set. Pt stands to benefit from continued skilled physical therapy to address deficit areas and restore safety with activities and participations at home and in the community.     OBJECTIVE IMPAIRMENTS: decreased activity tolerance, decreased endurance, decreased ROM, decreased strength, increased fascial restrictions, impaired perceived functional ability, increased muscle spasms, postural dysfunction, and pain.   ACTIVITY LIMITATIONS: carrying, lifting, bending, sitting, standing, and squatting  PARTICIPATION LIMITATIONS: meal prep, cleaning, laundry, driving, shopping, community activity, and yard work  PERSONAL FACTORS: Age, Past/current experiences, and Time since onset of injury/illness/exacerbation are also affecting patient's functional outcome.   REHAB POTENTIAL: Good  CLINICAL DECISION MAKING:  Stable/uncomplicated  EVALUATION COMPLEXITY: Low   GOALS: Goals reviewed with patient? Yes  SHORT TERM GOALS: Target date: 04/28/24   Pt will report compliance with HEP to work towards ind and home management strategies Baseline: Goal status: Progressing 04/15/23   2.  Pt will score no greater than 10/50 on ODI to demonstrate improved activity tolerance Baseline: 20/50 Goal status: INITIAL   3.  Pt will improve lumbar spine ROM to full and painless in order to demonstrate progress towards activity tolerance and improved function Baseline:  Goal status: INITIAL   4.  Pt will report no greater than 5/10 pain  throughout spine or bilateral shoulders to demonstrate improved tolerance to activity and decreased guarding response Baseline:  Goal status: 7/10 ongoing 04/15/23      LONG TERM GOALS: Target date: 05/23/24   Pt will score no greater than 0/50 on ODI to demonstrate improved activity tolerance Baseline:  Goal status: INITIAL   2.  Pt will report no greater than 2/10 pain over 7 consecutive days to demonstrate maintained reduction in symptoms and improved tolerance to activity Baseline:  Goal status: INITIAL   3.  Pt will be ind in the management of their symptoms at home and in the community Baseline:  Goal status: INITIAL   PLAN:  PT FREQUENCY: 1-2x/week  PT DURATION: 8 weeks  PLANNED INTERVENTIONS: 97110-Therapeutic exercises, 97530- Therapeutic activity, [id number]- Neuromuscular re-education, 97535- Self Care, 02859- Manual therapy, G0283- Electrical stimulation (unattended), 20560 (1-2 muscles), 20561 (3+ muscles)- Dry Needling, Patient/Family education, Cryotherapy, and Moist heat.  PLAN FOR NEXT SESSION: progress functional activity tolerance, strength, stability, endurance, postural education, soft tissue mobilization   Tanda KANDICE Sorrow, PTA 04/14/2024, 1:44 PM  "

## 2024-04-19 ENCOUNTER — Ambulatory Visit: Admitting: Physical Therapy

## 2024-04-21 ENCOUNTER — Encounter: Payer: Self-pay | Admitting: Physical Therapy

## 2024-04-21 ENCOUNTER — Ambulatory Visit: Admitting: Physical Therapy

## 2024-04-21 DIAGNOSIS — M6281 Muscle weakness (generalized): Secondary | ICD-10-CM

## 2024-04-21 DIAGNOSIS — G8929 Other chronic pain: Secondary | ICD-10-CM

## 2024-04-21 DIAGNOSIS — R293 Abnormal posture: Secondary | ICD-10-CM

## 2024-04-21 DIAGNOSIS — M5459 Other low back pain: Secondary | ICD-10-CM

## 2024-04-21 DIAGNOSIS — M542 Cervicalgia: Secondary | ICD-10-CM

## 2024-04-21 DIAGNOSIS — M546 Pain in thoracic spine: Secondary | ICD-10-CM

## 2024-04-21 NOTE — Therapy (Signed)
 " OUTPATIENT PHYSICAL THERAPY THORACOLUMBAR TREATMENT   Patient Name: EYANNA MCGONAGLE MRN: [id number] DOB:Nov 07, 1950, 74 y.o., female Today's Date: 04/21/2024  END OF SESSION:  PT End of Session - 04/21/24 1309     Visit Number 4    Date for Recertification  05/23/24    PT Start Time 1303    PT Stop Time 1345    PT Time Calculation (min) 42 min    Activity Tolerance Patient tolerated treatment well    Behavior During Therapy WFL for tasks assessed/performed          Past Medical History:  Diagnosis Date   Acid reflux    Anemia    Anxiety    Arthritis    Hiatal hernia    Hypercholesteremia    Hypertension    Past Surgical History:  Procedure Laterality Date   BREAST SURGERY     NASAL SINUS SURGERY     TOTAL HIP ARTHROPLASTY     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   Procedure: TOTAL KNEE ARTHROPLASTY;  Surgeon: Melodi Lerner, MD;  Location: [facility];  Service: Orthopedics;  Laterality: Left;   TUBAL LIGATION     Patient Active Problem List   Diagnosis Date Noted   OA (osteoarthritis) of knee 07/18/2019   Primary osteoarthritis of left knee 07/18/2019    PCP: Slater Sole, MD  REFERRING PROVIDER: Donaciano Sprang, MD  REFERRING DIAG: M54.9 (ICD-10-CM) - Dorsalgia, unspecified   Rationale for Evaluation and Treatment: Rehabilitation  THERAPY DIAG:  Other low back pain  Pain in thoracic spine  Cervicalgia  Chronic pain of both shoulders  Abnormal posture  Muscle weakness (generalized)  ONSET DATE: prev MD visit  SUBJECTIVE:                                                                                                                                                                                           SUBJECTIVE STATEMENT: I am doing ok  Pt states that she has had chronic low back pain which has been episodic in nature, also endorses pain in the upper thoracic spine, bilateral shoulders, and cervical spine. Pt reports good outcomes with  previous therapy, notes that she had 3 months of relief following dry needling. Pt states that her symptoms have been overall worsening, varies depending on the day. Symptoms range in intensity from 0/10-9/10. Symptoms are activity/position dependent. Denies radicular symptoms.   PERTINENT HISTORY:  Hx scoliosis  Osteoporosis  PAIN:  Are you having pain? Yes: NPRS scale: 8/10 Pain location: low back, thoracic spine, Bil shoulders Pain description: ache, soreness, dull Aggravating factors: static positioning Relieving factors: rest, tylenol   PRECAUTIONS: None  RED FLAGS: None   WEIGHT BEARING RESTRICTIONS: No  FALLS:  Has patient fallen in last 6 months? No  LIVING ENVIRONMENT: Lives with: lives alone Lives in: House/apartment Stairs: No Has following equipment at home: None  OCCUPATION: retired  PLOF: Independent  PATIENT GOALS: walk better, improved posture  NEXT MD VISIT: PRN  OBJECTIVE:  Note: Objective measures were completed at Evaluation unless otherwise noted.  DIAGNOSTIC FINDINGS:  See imaging section of chart  PATIENT SURVEYS:  Modified Oswestry:  MODIFIED OSWESTRY DISABILITY SCALE  Date: 03/28/24 Score  Pain intensity 3 =  Pain medication provides me with moderate relief from pain.  2. Personal care (washing, dressing, etc.) 0 =  I can take care of myself normally without causing increased pain.  3. Lifting 3 = Pain prevents me from lifting heavy weights, but I can manage light to medium weights if they are conveniently positioned  4. Walking 1 = Pain prevents me from walking more than 1 mile.  5. Sitting 2 =  Pain prevents me from sitting more than 1 hour.  6. Standing 3 =  Pain prevents me from standing more than 1/2 hour.  7. Sleeping 2 =  Even when I take pain medication, I sleep less than 6 hours  8. Social Life 2 = Pain prevents me from participating in more energetic activities (eg. sports, dancing).  9. Traveling 2 =  My pain restricts my  travel over 2 hours.  10. Employment/ Homemaking 2 = I can perform most of my homemaking/job duties, but pain prevents me from performing more physically stressful activities (eg, lifting, vacuuming).  Total 20/50   Interpretation of scores: Score Category Description  0-20% Minimal Disability The patient can cope with most living activities. Usually no treatment is indicated apart from advice on lifting, sitting and exercise  21-40% Moderate Disability The patient experiences more pain and difficulty with sitting, lifting and standing. Travel and social life are more difficult and they may be disabled from work. Personal care, sexual activity and sleeping are not grossly affected, and the patient can usually be managed by conservative means  41-60% Severe Disability Pain remains the main problem in this group, but activities of daily living are affected. These patients require a detailed investigation  61-80% Crippled Back pain impinges on all aspects of the patients life. Positive intervention is required  81-100% Bed-bound These patients are either bed-bound or exaggerating their symptoms  Bluford FORBES Zoe DELENA Karon DELENA, et al. Surgery versus conservative management of stable thoracolumbar fracture: the PRESTO feasibility RCT. [place] ([place]): [employer]; 2021 Nov. [facility] Technology Assessment, No. 25.62.) Appendix 3, Oswestry Disability Index category descriptors. Available from: Findjewelers.cz  Minimally Clinically Important Difference (MCID) = 12.8%  COGNITION: Overall cognitive status: Within functional limits for tasks assessed     SENSATION: WFL   POSTURE: rounded shoulders, forward head, and weight shift right  PALPATION: Inc TTP along right upper trap,no deformities noted   LUMBAR ROM:   AROM eval 04/21/24  Flexion Nil loss WFL  Extension 25% loss, central low back pain WFL w/ P!  Right lateral flexion 25% loss, right low back  pain Limited 25%  Left lateral flexion 25% loss, left low back pain WFL w/ P!  Right rotation  Limited 25%  Left rotation  WFL P!   (Blank rows = not tested)  LOWER EXTREMITY ROM:   WFL BLE  Active  Right eval Left eval  Hip flexion    Hip  extension    Hip abduction    Hip adduction    Hip internal rotation    Hip external rotation    Knee flexion    Knee extension    Ankle dorsiflexion    Ankle plantarflexion    Ankle inversion    Ankle eversion     (Blank rows = not tested)  LOWER EXTREMITY MMT:    MMT Right eval Left eval  Hip flexion 4-/5 4+/5  Hip extension    Hip abduction 4-/5 4-/5  Hip adduction 4+/5 4+/5  Hip internal rotation    Hip external rotation    Knee flexion 4/5 4/5  Knee extension 4+/5 4+/5  Ankle dorsiflexion 5/5 5/5  Ankle plantarflexion 5/5 5/5  Ankle inversion    Ankle eversion     (Blank rows = not tested)     TREATMENT DATE:  04/21/24 NuStep L 5 x 6 min HS curls 25lb 2x10 Leg Ext 10lb 2x10 Sit to stands x10 Modified Oswestry Low Back Pain Disability Questionnaire: 21 / 50 = 42.0 % STM to UT, Rhomboids and cervical spine 04/15/23 NuStep L 5 x 6 min STM to UT and cervical spine Shoulder Ext 5lb 2x10 Rows 10lb 2x10 HS curls 25lb 2x10 Leg Ext 5lb 2x10 Horizontal abduction green 2x10  Seated OHP yellow ball 2x10 Sit to stands x10   04/12/24 NuStep L 5 x 6 min Sit to stand 2x10 Shoulder Ext 5lb 2x10 Rows 10lb 2x10 HS curls 25lb 2x10   03/28/24- EVAL Repeated lumbar extension in standing x10: decreased symptoms of left side glide in standing  HEP developed and provided, educated on rationale                                                                                                                                  PATIENT EDUCATION:  Education details: Pt educated on relevant anatomy, physiology, pathology, diagnosis, prognosis, progression of care, pain and activity modification related to dorsal pain Person  educated: Patient Education method: Explanation, Demonstration, and Handouts Education comprehension: verbalized understanding and returned demonstration  HOME EXERCISE PROGRAM: Access Code: [email or web address] URL: [email or web address] Date: 03/28/2024 Prepared by: Stann Ohara  Exercises - Standing Lumbar Extension  - 3 x daily - 7 x weekly - 1 sets - 10 reps - 2 hold  ASSESSMENT:  CLINICAL IMPRESSION: Patient is a 74 y.o. F who was seen today for physical therapy treatment for dorsalgia most evident in upper thoracic spine, cervical spine, and lumbar spine. Continues with postural strength while adding STM. Positive response to STM evident by improved tissue elasticity and ease of motion. She has progresse dincreasing his lumbar AROM some but with pain. Small regression on MOLPD.  Pt stands to benefit from continued skilled physical therapy to address deficit areas and restore safety with activities and participations at home and in the community.     OBJECTIVE IMPAIRMENTS: decreased activity tolerance, decreased endurance, decreased ROM, decreased strength,  increased fascial restrictions, impaired perceived functional ability, increased muscle spasms, postural dysfunction, and pain.   ACTIVITY LIMITATIONS: carrying, lifting, bending, sitting, standing, and squatting  PARTICIPATION LIMITATIONS: meal prep, cleaning, laundry, driving, shopping, community activity, and yard work  PERSONAL FACTORS: Age, Past/current experiences, and Time since onset of injury/illness/exacerbation are also affecting patient's functional outcome.   REHAB POTENTIAL: Good  CLINICAL DECISION MAKING: Stable/uncomplicated  EVALUATION COMPLEXITY: Low   GOALS: Goals reviewed with patient? Yes  SHORT TERM GOALS: Target date: 04/28/24   Pt will report compliance with HEP to work towards ind and home management strategies Baseline: Goal status: Progressing 04/15/23, 04/21/24 Met   2.  Pt will score no  greater than 10/50 on ODI to demonstrate improved activity tolerance Baseline: 20/50 Goal status: 21 / 50 = 42.0 % ongoing 04/21/24   3.  Pt will improve lumbar spine ROM to full and painless in order to demonstrate progress towards activity tolerance and improved function Baseline:  Goal status: INITIAL   4.  Pt will report no greater than 5/10 pain throughout spine or bilateral shoulders to demonstrate improved tolerance to activity and decreased guarding response Baseline:  Goal status: 7/10 ongoing 04/15/23      LONG TERM GOALS: Target date: 05/23/24   Pt will score no greater than 0/50 on ODI to demonstrate improved activity tolerance Baseline:  Goal status: INITIAL   2.  Pt will report no greater than 2/10 pain over 7 consecutive days to demonstrate maintained reduction in symptoms and improved tolerance to activity Baseline:  Goal status: INITIAL   3.  Pt will be ind in the management of their symptoms at home and in the community Baseline:  Goal status: INITIAL   PLAN:  PT FREQUENCY: 1-2x/week  PT DURATION: 8 weeks  PLANNED INTERVENTIONS: 97110-Therapeutic exercises, 97530- Therapeutic activity, [id number]- Neuromuscular re-education, 97535- Self Care, 02859- Manual therapy, G0283- Electrical stimulation (unattended), 20560 (1-2 muscles), 20561 (3+ muscles)- Dry Needling, Patient/Family education, Cryotherapy, and Moist heat.  PLAN FOR NEXT SESSION: progress functional activity tolerance, strength, stability, endurance, postural education, soft tissue mobilization   Tanda KANDICE Sorrow, PTA 04/21/2024, 1:09 PM  "

## 2024-04-26 ENCOUNTER — Ambulatory Visit: Admitting: Physical Therapy

## 2024-04-26 ENCOUNTER — Encounter: Payer: Self-pay | Admitting: Physical Therapy

## 2024-04-26 DIAGNOSIS — M542 Cervicalgia: Secondary | ICD-10-CM

## 2024-04-26 DIAGNOSIS — M5459 Other low back pain: Secondary | ICD-10-CM

## 2024-04-26 DIAGNOSIS — M546 Pain in thoracic spine: Secondary | ICD-10-CM

## 2024-04-26 DIAGNOSIS — R293 Abnormal posture: Secondary | ICD-10-CM

## 2024-04-26 DIAGNOSIS — M6281 Muscle weakness (generalized): Secondary | ICD-10-CM

## 2024-04-26 NOTE — Therapy (Signed)
 " OUTPATIENT PHYSICAL THERAPY THORACOLUMBAR TREATMENT   Patient Name: Vanessa Blair MRN: [id number] DOB:Aug 23, 1950, 74 y.o., female Today's Date: 04/26/2024  END OF SESSION:  PT End of Session - 04/26/24 1102     Visit Number 5    Date for Recertification  05/23/24    PT Start Time 1103    PT Stop Time 1145    PT Time Calculation (min) 42 min    Activity Tolerance Patient tolerated treatment well    Behavior During Therapy WFL for tasks assessed/performed          Past Medical History:  Diagnosis Date   Acid reflux    Anemia    Anxiety    Arthritis    Hiatal hernia    Hypercholesteremia    Hypertension    Past Surgical History:  Procedure Laterality Date   BREAST SURGERY     NASAL SINUS SURGERY     TOTAL HIP ARTHROPLASTY     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   Procedure: TOTAL KNEE ARTHROPLASTY;  Surgeon: Melodi Lerner, MD;  Location: [facility];  Service: Orthopedics;  Laterality: Left;   TUBAL LIGATION     Patient Active Problem List   Diagnosis Date Noted   OA (osteoarthritis) of knee 07/18/2019   Primary osteoarthritis of left knee 07/18/2019    PCP: Slater Sole, MD  REFERRING PROVIDER: Donaciano Sprang, MD  REFERRING DIAG: M54.9 (ICD-10-CM) - Dorsalgia, unspecified   Rationale for Evaluation and Treatment: Rehabilitation  THERAPY DIAG:  Other low back pain  Pain in thoracic spine  Cervicalgia  Abnormal posture  Muscle weakness (generalized)  Neck pain  ONSET DATE: prev MD visit  SUBJECTIVE:                                                                                                                                                                                           SUBJECTIVE STATEMENT: Neck is tight and stiff  Pt states that she has had chronic low back pain which has been episodic in nature, also endorses pain in the upper thoracic spine, bilateral shoulders, and cervical spine. Pt reports good outcomes with previous therapy,  notes that she had 3 months of relief following dry needling. Pt states that her symptoms have been overall worsening, varies depending on the day. Symptoms range in intensity from 0/10-9/10. Symptoms are activity/position dependent. Denies radicular symptoms.   PERTINENT HISTORY:  Hx scoliosis  Osteoporosis  PAIN:  Are you having pain? Yes: NPRS scale: 7/10 Pain location: low back, thoracic spine, Bil shoulders Pain description: ache, soreness, dull Aggravating factors: static positioning Relieving factors: rest, tylenol   PRECAUTIONS: None  RED FLAGS: None   WEIGHT BEARING RESTRICTIONS: No  FALLS:  Has patient fallen in last 6 months? No  LIVING ENVIRONMENT: Lives with: lives alone Lives in: House/apartment Stairs: No Has following equipment at home: None  OCCUPATION: retired  PLOF: Independent  PATIENT GOALS: walk better, improved posture  NEXT MD VISIT: PRN  OBJECTIVE:  Note: Objective measures were completed at Evaluation unless otherwise noted.  DIAGNOSTIC FINDINGS:  See imaging section of chart  PATIENT SURVEYS:  Modified Oswestry:  MODIFIED OSWESTRY DISABILITY SCALE  Date: 03/28/24 Score  Pain intensity 3 =  Pain medication provides me with moderate relief from pain.  2. Personal care (washing, dressing, etc.) 0 =  I can take care of myself normally without causing increased pain.  3. Lifting 3 = Pain prevents me from lifting heavy weights, but I can manage light to medium weights if they are conveniently positioned  4. Walking 1 = Pain prevents me from walking more than 1 mile.  5. Sitting 2 =  Pain prevents me from sitting more than 1 hour.  6. Standing 3 =  Pain prevents me from standing more than 1/2 hour.  7. Sleeping 2 =  Even when I take pain medication, I sleep less than 6 hours  8. Social Life 2 = Pain prevents me from participating in more energetic activities (eg. sports, dancing).  9. Traveling 2 =  My pain restricts my travel over 2 hours.   10. Employment/ Homemaking 2 = I can perform most of my homemaking/job duties, but pain prevents me from performing more physically stressful activities (eg, lifting, vacuuming).  Total 20/50   Interpretation of scores: Score Category Description  0-20% Minimal Disability The patient can cope with most living activities. Usually no treatment is indicated apart from advice on lifting, sitting and exercise  21-40% Moderate Disability The patient experiences more pain and difficulty with sitting, lifting and standing. Travel and social life are more difficult and they may be disabled from work. Personal care, sexual activity and sleeping are not grossly affected, and the patient can usually be managed by conservative means  41-60% Severe Disability Pain remains the main problem in this group, but activities of daily living are affected. These patients require a detailed investigation  61-80% Crippled Back pain impinges on all aspects of the patients life. Positive intervention is required  81-100% Bed-bound These patients are either bed-bound or exaggerating their symptoms  Bluford FORBES Zoe DELENA Karon DELENA, et al. Surgery versus conservative management of stable thoracolumbar fracture: the PRESTO feasibility RCT. [place] ([place]): [employer]; 2021 Nov. [facility] Technology Assessment, No. 25.62.) Appendix 3, Oswestry Disability Index category descriptors. Available from: [email or web address]  Minimally Clinically Important Difference (MCID) = 12.8%  COGNITION: Overall cognitive status: Within functional limits for tasks assessed     SENSATION: WFL   POSTURE: rounded shoulders, forward head, and weight shift right  PALPATION: Inc TTP along right upper trap,no deformities noted   LUMBAR ROM:   AROM eval 04/21/24  Flexion Nil loss WFL  Extension 25% loss, central low back pain WFL w/ P!  Right lateral flexion 25% loss, right low back pain Limited 25%  Left  lateral flexion 25% loss, left low back pain WFL w/ P!  Right rotation  Limited 25%  Left rotation  WFL P!   (Blank rows = not tested)  LOWER EXTREMITY ROM:   WFL BLE  Active  Right eval Left eval  Hip flexion    Hip extension  Hip abduction    Hip adduction    Hip internal rotation    Hip external rotation    Knee flexion    Knee extension    Ankle dorsiflexion    Ankle plantarflexion    Ankle inversion    Ankle eversion     (Blank rows = not tested)  LOWER EXTREMITY MMT:    MMT Right eval Left eval  Hip flexion 4-/5 4+/5  Hip extension    Hip abduction 4-/5 4-/5  Hip adduction 4+/5 4+/5  Hip internal rotation    Hip external rotation    Knee flexion 4/5 4/5  Knee extension 4+/5 4+/5  Ankle dorsiflexion 5/5 5/5  Ankle plantarflexion 5/5 5/5  Ankle inversion    Ankle eversion     (Blank rows = not tested)     TREATMENT DATE:  04/26/24 UBE L2 x 2 min each STM to UT, Rhomboids and cervical spine Seated Rows and Lats 20lb 2x10 Shoulder Ext 5lb 2x10 HS curls 25lb 2x10 Leg Ext 10lb 2x10 STM to UT, Rhomboids and cervical spine  04/21/24 NuStep L 5 x 6 min HS curls 25lb 2x10 Leg Ext 10lb 2x10 Sit to stands x10 Modified Oswestry Low Back Pain Disability Questionnaire: 21 / 50 = 42.0 % STM to UT, Rhomboids and cervical spine  04/15/23 NuStep L 5 x 6 min STM to UT and cervical spine Shoulder Ext 5lb 2x10 Rows 10lb 2x10 HS curls 25lb 2x10 Leg Ext 5lb 2x10 Horizontal abduction green 2x10  Seated OHP yellow ball 2x10 Sit to stands x10   04/12/24 NuStep L 5 x 6 min Sit to stand 2x10 Shoulder Ext 5lb 2x10 Rows 10lb 2x10 HS curls 25lb 2x10   03/28/24- EVAL Repeated lumbar extension in standing x10: decreased symptoms of left side glide in standing  HEP developed and provided, educated on rationale                                                                                                                                  PATIENT EDUCATION:   Education details: Pt educated on relevant anatomy, physiology, pathology, diagnosis, prognosis, progression of care, pain and activity modification related to dorsal pain Person educated: Patient Education method: Explanation, Demonstration, and Handouts Education comprehension: verbalized understanding and returned demonstration  HOME EXERCISE PROGRAM: Access Code: [email or web address] URL: [email or web address] Date: 03/28/2024 Prepared by: Stann Ohara  Exercises - Standing Lumbar Extension  - 3 x daily - 7 x weekly - 1 sets - 10 reps - 2 hold  ASSESSMENT:  CLINICAL IMPRESSION: Patient is a 74 y.o. F who was seen today for physical therapy treatment for dorsalgia most evident in upper thoracic spine, cervical spine, and lumbar spine. Continues with postural strength to create more stability. Positive response to STM evident by improved tissue elasticity and ease of motion. Postural cue needed with seated ROW.  Pt stands to benefit from continued skilled physical therapy to address deficit  areas and restore safety with activities and participations at home and in the community.     OBJECTIVE IMPAIRMENTS: decreased activity tolerance, decreased endurance, decreased ROM, decreased strength, increased fascial restrictions, impaired perceived functional ability, increased muscle spasms, postural dysfunction, and pain.   ACTIVITY LIMITATIONS: carrying, lifting, bending, sitting, standing, and squatting  PARTICIPATION LIMITATIONS: meal prep, cleaning, laundry, driving, shopping, community activity, and yard work  PERSONAL FACTORS: Age, Past/current experiences, and Time since onset of injury/illness/exacerbation are also affecting patient's functional outcome.   REHAB POTENTIAL: Good  CLINICAL DECISION MAKING: Stable/uncomplicated  EVALUATION COMPLEXITY: Low   GOALS: Goals reviewed with patient? Yes  SHORT TERM GOALS: Target date: 04/28/24   Pt will report compliance with HEP  to work towards ind and home management strategies Baseline: Goal status: Progressing 04/15/23, 04/21/24 Met   2.  Pt will score no greater than 10/50 on ODI to demonstrate improved activity tolerance Baseline: 20/50 Goal status: 21 / 50 = 42.0 % ongoing 04/21/24   3.  Pt will improve lumbar spine ROM to full and painless in order to demonstrate progress towards activity tolerance and improved function Baseline:  Goal status: INITIAL   4.  Pt will report no greater than 5/10 pain throughout spine or bilateral shoulders to demonstrate improved tolerance to activity and decreased guarding response Baseline:  Goal status: 7/10 ongoing 04/15/23      LONG TERM GOALS: Target date: 05/23/24   Pt will score no greater than 0/50 on ODI to demonstrate improved activity tolerance Baseline:  Goal status: INITIAL   2.  Pt will report no greater than 2/10 pain over 7 consecutive days to demonstrate maintained reduction in symptoms and improved tolerance to activity Baseline:  Goal status: INITIAL   3.  Pt will be ind in the management of their symptoms at home and in the community Baseline:  Goal status: INITIAL   PLAN:  PT FREQUENCY: 1-2x/week  PT DURATION: 8 weeks  PLANNED INTERVENTIONS: 97110-Therapeutic exercises, 97530- Therapeutic activity, [id number]- Neuromuscular re-education, 97535- Self Care, 02859- Manual therapy, [id number]- Electrical stimulation (unattended), 20560 (1-2 muscles), 20561 (3+ muscles)- Dry Needling, Patient/Family education, Cryotherapy, and Moist heat.  PLAN FOR NEXT SESSION: progress functional activity tolerance, strength, stability, endurance, postural education, soft tissue mobilization   Tanda KANDICE Sorrow, PTA 04/26/2024, 11:03 AM  "

## 2024-04-28 ENCOUNTER — Ambulatory Visit: Admitting: Physical Therapy

## 2024-04-28 ENCOUNTER — Encounter: Payer: Self-pay | Admitting: Physical Therapy

## 2024-04-28 DIAGNOSIS — M5459 Other low back pain: Secondary | ICD-10-CM

## 2024-04-28 DIAGNOSIS — M542 Cervicalgia: Secondary | ICD-10-CM

## 2024-04-28 DIAGNOSIS — R293 Abnormal posture: Secondary | ICD-10-CM

## 2024-04-28 DIAGNOSIS — M6281 Muscle weakness (generalized): Secondary | ICD-10-CM

## 2024-04-28 DIAGNOSIS — M546 Pain in thoracic spine: Secondary | ICD-10-CM

## 2024-04-28 NOTE — Therapy (Signed)
 " OUTPATIENT PHYSICAL THERAPY THORACOLUMBAR TREATMENT   Patient Name: Vanessa Blair MRN: [id number] DOB:1950/09/26, 74 y.o., female Today's Date: 04/28/2024  END OF SESSION:  PT End of Session - 04/28/24 1104     Visit Number 6    Number of Visits 10    PT Start Time 1105    PT Stop Time 1145    PT Time Calculation (min) 40 min    Activity Tolerance Patient tolerated treatment well    Behavior During Therapy WFL for tasks assessed/performed          Past Medical History:  Diagnosis Date   Acid reflux    Anemia    Anxiety    Arthritis    Hiatal hernia    Hypercholesteremia    Hypertension    Past Surgical History:  Procedure Laterality Date   BREAST SURGERY     NASAL SINUS SURGERY     TOTAL HIP ARTHROPLASTY     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   Procedure: TOTAL KNEE ARTHROPLASTY;  Surgeon: Melodi Lerner, MD;  Location: [facility];  Service: Orthopedics;  Laterality: Left;   TUBAL LIGATION     Patient Active Problem List   Diagnosis Date Noted   OA (osteoarthritis) of knee 07/18/2019   Primary osteoarthritis of left knee 07/18/2019    PCP: Slater Sole, MD  REFERRING PROVIDER: Donaciano Sprang, MD  REFERRING DIAG: M54.9 (ICD-10-CM) - Dorsalgia, unspecified   Rationale for Evaluation and Treatment: Rehabilitation  THERAPY DIAG:  Cervicalgia  Abnormal posture  Pain in thoracic spine  Muscle weakness (generalized)  Neck pain  Other low back pain  ONSET DATE: prev MD visit  SUBJECTIVE:                                                                                                                                                                                           SUBJECTIVE STATEMENT: Neck is tight and stiff  Pt states that she has had chronic low back pain which has been episodic in nature, also endorses pain in the upper thoracic spine, bilateral shoulders, and cervical spine. Pt reports good outcomes with previous therapy, notes that she  had 3 months of relief following dry needling. Pt states that her symptoms have been overall worsening, varies depending on the day. Symptoms range in intensity from 0/10-9/10. Symptoms are activity/position dependent. Denies radicular symptoms.   PERTINENT HISTORY:  Hx scoliosis  Osteoporosis  PAIN:  Are you having pain? Yes: NPRS scale: 7/10 Pain location: low back, thoracic spine, Bil shoulders Pain description: ache, soreness, dull Aggravating factors: static positioning Relieving factors: rest, tylenol   PRECAUTIONS: None  RED FLAGS: None   WEIGHT BEARING RESTRICTIONS: No  FALLS:  Has patient fallen in last 6 months? No  LIVING ENVIRONMENT: Lives with: lives alone Lives in: House/apartment Stairs: No Has following equipment at home: None  OCCUPATION: retired  PLOF: Independent  PATIENT GOALS: walk better, improved posture  NEXT MD VISIT: PRN  OBJECTIVE:  Note: Objective measures were completed at Evaluation unless otherwise noted.  DIAGNOSTIC FINDINGS:  See imaging section of chart  PATIENT SURVEYS:  Modified Oswestry:  MODIFIED OSWESTRY DISABILITY SCALE  Date: 03/28/24 Score  Pain intensity 3 =  Pain medication provides me with moderate relief from pain.  2. Personal care (washing, dressing, etc.) 0 =  I can take care of myself normally without causing increased pain.  3. Lifting 3 = Pain prevents me from lifting heavy weights, but I can manage light to medium weights if they are conveniently positioned  4. Walking 1 = Pain prevents me from walking more than 1 mile.  5. Sitting 2 =  Pain prevents me from sitting more than 1 hour.  6. Standing 3 =  Pain prevents me from standing more than 1/2 hour.  7. Sleeping 2 =  Even when I take pain medication, I sleep less than 6 hours  8. Social Life 2 = Pain prevents me from participating in more energetic activities (eg. sports, dancing).  9. Traveling 2 =  My pain restricts my travel over 2 hours.  10. Employment/  Homemaking 2 = I can perform most of my homemaking/job duties, but pain prevents me from performing more physically stressful activities (eg, lifting, vacuuming).  Total 20/50   Interpretation of scores: Score Category Description  0-20% Minimal Disability The patient can cope with most living activities. Usually no treatment is indicated apart from advice on lifting, sitting and exercise  21-40% Moderate Disability The patient experiences more pain and difficulty with sitting, lifting and standing. Travel and social life are more difficult and they may be disabled from work. Personal care, sexual activity and sleeping are not grossly affected, and the patient can usually be managed by conservative means  41-60% Severe Disability Pain remains the main problem in this group, but activities of daily living are affected. These patients require a detailed investigation  61-80% Crippled Back pain impinges on all aspects of the patients life. Positive intervention is required  81-100% Bed-bound These patients are either bed-bound or exaggerating their symptoms  Bluford FORBES Zoe DELENA Karon DELENA, et al. Surgery versus conservative management of stable thoracolumbar fracture: the PRESTO feasibility RCT. [place] ([place]): [employer]; 2021 Nov. [facility] Technology Assessment, No. 25.62.) Appendix 3, Oswestry Disability Index category descriptors. Available from: [email or web address].cz  Minimally Clinically Important Difference (MCID) = 12.8%  COGNITION: Overall cognitive status: Within functional limits for tasks assessed     SENSATION: WFL   POSTURE: rounded shoulders, forward head, and weight shift right  PALPATION: Inc TTP along right upper trap,no deformities noted   LUMBAR ROM:   AROM eval 04/21/24  Flexion Nil loss WFL  Extension 25% loss, central low back pain WFL w/ P!  Right lateral flexion 25% loss, right low back pain Limited 25%  Left lateral flexion  25% loss, left low back pain WFL w/ P!  Right rotation  Limited 25%  Left rotation  WFL P!   (Blank rows = not tested)  LOWER EXTREMITY ROM:   WFL BLE  Active  Right eval Left eval  Hip flexion    Hip extension  Hip abduction    Hip adduction    Hip internal rotation    Hip external rotation    Knee flexion    Knee extension    Ankle dorsiflexion    Ankle plantarflexion    Ankle inversion    Ankle eversion     (Blank rows = not tested)  LOWER EXTREMITY MMT:    MMT Right eval Left eval  Hip flexion 4-/5 4+/5  Hip extension    Hip abduction 4-/5 4-/5  Hip adduction 4+/5 4+/5  Hip internal rotation    Hip external rotation    Knee flexion 4/5 4/5  Knee extension 4+/5 4+/5  Ankle dorsiflexion 5/5 5/5  Ankle plantarflexion 5/5 5/5  Ankle inversion    Ankle eversion     (Blank rows = not tested)     TREATMENT DATE:  04/28/24 NuStep L 5 x 6 min AAROM shoulder flex 2lb WaTE 2x10 Shoulder abd 2lb x10 HS curls 20lb 2x12 Leg Ext 10lb 2x12 Sit to stand OHP w/ yellow ball 2x10 STM to UT, Rhomboids and cervical spine  04/26/24 UBE L2 x 2 min each STM to UT, Rhomboids and cervical spine Seated Rows and Lats 20lb 2x10 Shoulder Ext 5lb 2x10 HS curls 25lb 2x10 Leg Ext 10lb 2x10 STM to UT, Rhomboids and cervical spine  04/21/24 NuStep L 5 x 6 min HS curls 25lb 2x10 Leg Ext 10lb 2x10 Sit to stands x10 Modified Oswestry Low Back Pain Disability Questionnaire: 21 / 50 = 42.0 % STM to UT, Rhomboids and cervical spine  04/15/23 NuStep L 5 x 6 min STM to UT and cervical spine Shoulder Ext 5lb 2x10 Rows 10lb 2x10 HS curls 25lb 2x10 Leg Ext 5lb 2x10 Horizontal abduction green 2x10  Seated OHP yellow ball 2x10 Sit to stands x10   04/12/24 NuStep L 5 x 6 min Sit to stand 2x10 Shoulder Ext 5lb 2x10 Rows 10lb 2x10 HS curls 25lb 2x10   03/28/24- EVAL Repeated lumbar extension in standing x10: decreased symptoms of left side glide in standing  HEP developed and  provided, educated on rationale                                                                                                                                  PATIENT EDUCATION:  Education details: Pt educated on relevant anatomy, physiology, pathology, diagnosis, prognosis, progression of care, pain and activity modification related to dorsal pain Person educated: Patient Education method: Explanation, Demonstration, and Handouts Education comprehension: verbalized understanding and returned demonstration  HOME EXERCISE PROGRAM: Access Code: [email or web address] URL: [email or web address] Date: 03/28/2024 Prepared by: Stann Ohara  Exercises - Standing Lumbar Extension  - 3 x daily - 7 x weekly - 1 sets - 10 reps - 2 hold  ASSESSMENT:  CLINICAL IMPRESSION: Patient is a 74 y.o. F who was seen today for physical therapy treatment for dorsalgia most evident in upper thoracic spine, cervical spine, and lumbar  spine. Continues with postural strength to create more stability. Positive response to STM evident by improved tissue elasticity and ease of motion. Some L shoulder pain reported with abduction.  Pt stands to benefit from continued skilled physical therapy to address deficit areas and restore safety with activities and participations at home and in the community.     OBJECTIVE IMPAIRMENTS: decreased activity tolerance, decreased endurance, decreased ROM, decreased strength, increased fascial restrictions, impaired perceived functional ability, increased muscle spasms, postural dysfunction, and pain.   ACTIVITY LIMITATIONS: carrying, lifting, bending, sitting, standing, and squatting  PARTICIPATION LIMITATIONS: meal prep, cleaning, laundry, driving, shopping, community activity, and yard work  PERSONAL FACTORS: Age, Past/current experiences, and Time since onset of injury/illness/exacerbation are also affecting patient's functional outcome.   REHAB POTENTIAL: Good  CLINICAL  DECISION MAKING: Stable/uncomplicated  EVALUATION COMPLEXITY: Low   GOALS: Goals reviewed with patient? Yes  SHORT TERM GOALS: Target date: 04/28/24   Pt will report compliance with HEP to work towards ind and home management strategies Baseline: Goal status: Progressing 04/15/23, 04/21/24 Met   2.  Pt will score no greater than 10/50 on ODI to demonstrate improved activity tolerance Baseline: 20/50 Goal status: 21 / 50 = 42.0 % ongoing 04/21/24   3.  Pt will improve lumbar spine ROM to full and painless in order to demonstrate progress towards activity tolerance and improved function Baseline:  Goal status: Progressing 04/28/24    4.  Pt will report no greater than 5/10 pain throughout spine or bilateral shoulders to demonstrate improved tolerance to activity and decreased guarding response Baseline:  Goal status: 7/10 ongoing 04/15/23, 7-8 04/28/24      LONG TERM GOALS: Target date: 05/23/24   Pt will score no greater than 0/50 on ODI to demonstrate improved activity tolerance Baseline:  Goal status: INITIAL   2.  Pt will report no greater than 2/10 pain over 7 consecutive days to demonstrate maintained reduction in symptoms and improved tolerance to activity Baseline:  Goal status: INITIAL   3.  Pt will be ind in the management of their symptoms at home and in the community Baseline:  Goal status: INITIAL   PLAN:  PT FREQUENCY: 1-2x/week  PT DURATION: 8 weeks  PLANNED INTERVENTIONS: 97110-Therapeutic exercises, 97530- Therapeutic activity, [id number]- Neuromuscular re-education, 97535- Self Care, 02859- Manual therapy, G0283- Electrical stimulation (unattended), 20560 (1-2 muscles), 20561 (3+ muscles)- Dry Needling, Patient/Family education, Cryotherapy, and Moist heat.  PLAN FOR NEXT SESSION: progress functional activity tolerance, strength, stability, endurance, postural education, soft tissue mobilization   Tanda KANDICE Sorrow, PTA 04/28/2024, 11:05 AM  "

## 2024-05-03 ENCOUNTER — Ambulatory Visit: Admitting: Physical Therapy

## 2024-05-10 ENCOUNTER — Ambulatory Visit: Admitting: Physical Therapy

## 2024-05-12 ENCOUNTER — Ambulatory Visit: Admitting: Physical Therapy

## 2024-05-17 ENCOUNTER — Ambulatory Visit: Admitting: Physical Therapy

## 2024-05-19 ENCOUNTER — Ambulatory Visit: Admitting: Physical Therapy
